# Patient Record
Sex: Female | Born: 1990 | Hispanic: No | Marital: Single | Smoking: Never smoker
Health system: Southern US, Community
[De-identification: ages and names within clinical notes are randomized; demographics above are authoritative.]

## PROBLEM LIST (undated history)

## (undated) ENCOUNTER — Inpatient Hospital Stay (HOSPITAL_COMMUNITY): Payer: Self-pay

## (undated) DIAGNOSIS — O139 Gestational [pregnancy-induced] hypertension without significant proteinuria, unspecified trimester: Secondary | ICD-10-CM

## (undated) DIAGNOSIS — Z789 Other specified health status: Secondary | ICD-10-CM

## (undated) DIAGNOSIS — B999 Unspecified infectious disease: Secondary | ICD-10-CM

## (undated) DIAGNOSIS — D649 Anemia, unspecified: Secondary | ICD-10-CM

## (undated) DIAGNOSIS — N764 Abscess of vulva: Secondary | ICD-10-CM

## (undated) DIAGNOSIS — R51 Headache: Secondary | ICD-10-CM

## (undated) DIAGNOSIS — A630 Anogenital (venereal) warts: Secondary | ICD-10-CM

## (undated) DIAGNOSIS — R519 Headache, unspecified: Secondary | ICD-10-CM

## (undated) HISTORY — DX: Abscess of vulva: N76.4

## (undated) HISTORY — PX: OTHER SURGICAL HISTORY: SHX169

## (undated) HISTORY — DX: Anogenital (venereal) warts: A63.0

## (undated) HISTORY — PX: WISDOM TOOTH EXTRACTION: SHX21

---

## 2017-07-05 ENCOUNTER — Ambulatory Visit: Payer: Self-pay | Admitting: *Deleted

## 2017-07-05 ENCOUNTER — Encounter: Payer: Self-pay | Admitting: *Deleted

## 2017-07-05 DIAGNOSIS — Z3480 Encounter for supervision of other normal pregnancy, unspecified trimester: Secondary | ICD-10-CM

## 2017-07-05 DIAGNOSIS — Z3201 Encounter for pregnancy test, result positive: Secondary | ICD-10-CM

## 2017-07-05 LAB — POCT PREGNANCY, URINE: PREG TEST UR: POSITIVE — AB

## 2017-07-05 NOTE — Progress Notes (Signed)
Here for pregnancy test which was positive. LMP 05/29/17 which makes her [redacted]w[redacted]d with EDd 03/05/18. States not sure where she will go for prenatal care.

## 2017-07-05 NOTE — Progress Notes (Signed)
Chart reviewed for nurse visit. Agree with plan of care.   Marylene Land, CNM 07/05/2017 1:26 PM

## 2017-07-11 ENCOUNTER — Encounter (HOSPITAL_COMMUNITY): Payer: Self-pay | Admitting: *Deleted

## 2017-07-11 ENCOUNTER — Inpatient Hospital Stay (HOSPITAL_COMMUNITY): Payer: Medicaid Other

## 2017-07-11 ENCOUNTER — Inpatient Hospital Stay (HOSPITAL_COMMUNITY)
Admission: AD | Admit: 2017-07-11 | Discharge: 2017-07-11 | Disposition: A | Discharge status: Home / Self Care | Payer: Medicaid Other | Source: Ambulatory Visit | Attending: Obstetrics & Gynecology | Admitting: Obstetrics & Gynecology

## 2017-07-11 DIAGNOSIS — O26891 Other specified pregnancy related conditions, first trimester: Secondary | ICD-10-CM | POA: Diagnosis not present

## 2017-07-11 DIAGNOSIS — R109 Unspecified abdominal pain: Secondary | ICD-10-CM | POA: Insufficient documentation

## 2017-07-11 DIAGNOSIS — O468X1 Other antepartum hemorrhage, first trimester: Secondary | ICD-10-CM

## 2017-07-11 DIAGNOSIS — O208 Other hemorrhage in early pregnancy: Secondary | ICD-10-CM | POA: Diagnosis not present

## 2017-07-11 DIAGNOSIS — O98811 Other maternal infectious and parasitic diseases complicating pregnancy, first trimester: Secondary | ICD-10-CM | POA: Diagnosis not present

## 2017-07-11 DIAGNOSIS — O418X10 Other specified disorders of amniotic fluid and membranes, first trimester, not applicable or unspecified: Secondary | ICD-10-CM | POA: Diagnosis present

## 2017-07-11 DIAGNOSIS — Z3A01 Less than 8 weeks gestation of pregnancy: Secondary | ICD-10-CM | POA: Diagnosis not present

## 2017-07-11 DIAGNOSIS — B373 Candidiasis of vulva and vagina: Secondary | ICD-10-CM | POA: Diagnosis not present

## 2017-07-11 DIAGNOSIS — Z3490 Encounter for supervision of normal pregnancy, unspecified, unspecified trimester: Secondary | ICD-10-CM

## 2017-07-11 DIAGNOSIS — R110 Nausea: Secondary | ICD-10-CM | POA: Insufficient documentation

## 2017-07-11 DIAGNOSIS — Z79899 Other long term (current) drug therapy: Secondary | ICD-10-CM | POA: Diagnosis not present

## 2017-07-11 DIAGNOSIS — B3731 Acute candidiasis of vulva and vagina: Secondary | ICD-10-CM | POA: Diagnosis present

## 2017-07-11 HISTORY — DX: Other specified health status: Z78.9

## 2017-07-11 LAB — WET PREP, GENITAL
CLUE CELLS WET PREP: NONE SEEN
SPERM: NONE SEEN
TRICH WET PREP: NONE SEEN

## 2017-07-11 LAB — HCG, QUANTITATIVE, PREGNANCY: HCG, BETA CHAIN, QUANT, S: 30389 m[IU]/mL — AB (ref <5)

## 2017-07-11 LAB — URINALYSIS, ROUTINE W REFLEX MICROSCOPIC
Bilirubin Urine: NEGATIVE
GLUCOSE, UA: NEGATIVE mg/dL
Hgb urine dipstick: NEGATIVE
KETONES UR: NEGATIVE mg/dL
Nitrite: NEGATIVE
PH: 6 (ref 5.0–8.0)
Protein, ur: NEGATIVE mg/dL
Specific Gravity, Urine: 1.014 (ref 1.005–1.030)

## 2017-07-11 LAB — CBC
HEMATOCRIT: 34.9 % — AB (ref 36.0–46.0)
HEMOGLOBIN: 10.7 g/dL — AB (ref 12.0–15.0)
MCH: 23.2 pg — ABNORMAL LOW (ref 26.0–34.0)
MCHC: 30.7 g/dL (ref 30.0–36.0)
MCV: 75.7 fL — ABNORMAL LOW (ref 78.0–100.0)
Platelets: 380 10*3/uL (ref 150–400)
RBC: 4.61 MIL/uL (ref 3.87–5.11)
RDW: 17.2 % — ABNORMAL HIGH (ref 11.5–15.5)
WBC: 8.8 10*3/uL (ref 4.0–10.5)

## 2017-07-11 LAB — ABO/RH: ABO/RH(D): O POS

## 2017-07-11 MED ORDER — TERCONAZOLE 0.4 % VA CREA: 1.0000 | TOPICAL_CREAM | Freq: Every day | VAGINAL | 0 refills | Status: DC

## 2017-07-11 NOTE — MAU Note (Signed)
Urine sent to lab 

## 2017-07-11 NOTE — MAU Note (Signed)
Pt reports sharp lower abd pain x two weeks, pain is more on right side now. Also reports thick white discharge.

## 2017-07-11 NOTE — MAU Provider Note (Addendum)
Chief Complaint: Abdominal Pain and Vaginal Discharge   First Provider Initiated Contact with Patient 07/11/17 1203     HPI  [name] is a 27 y.o. G1P0 at [redacted]w[redacted]d by LMP who presents to maternity admissions reporting cramping and sharp lower abdominal pain x 2 weeks. The pain started on the left lower side and radiates across to the right but today is more often on the right. It is intermittent and gradually worsening.  It is associated with nausea but no vomiting.  She also reports thick clumping white vaginal discharge x 1 week.  There are no other associated symptoms. She has not tried any treatments. She denies vaginal bleeding, urinary symptoms, h/a, dizziness, or fever/chills.     Past Medical History:  Diagnosis Date  . Medical history non-contributory    Past Surgical History:  Procedure Laterality Date  . WISDOM TOOTH EXTRACTION     Social History   Socioeconomic History  . Marital status: Single    Spouse name: Not on file  . Number of children: Not on file  . Years of education: Not on file  . Highest education level: Not on file  Occupational History  . Not on file  Social Needs  . Financial resource strain: Not on file  . Food insecurity:    Worry: Not on file    Inability: Not on file  . Transportation needs:    Medical: Not on file    Non-medical: Not on file  Tobacco Use  . Smoking status: Never Smoker  . Smokeless tobacco: Never Used  Substance and Sexual Activity  . Alcohol use: Never    Frequency: Never  . Drug use: Never  . Sexual activity: Yes  Lifestyle  . Physical activity:    Days per week: Not on file    Minutes per session: Not on file  . Stress: Not on file  Relationships  . Social connections:    Talks on phone: Not on file    Gets together: Not on file    Attends religious service: Not on file    Active member of club or organization: Not on file    Attends meetings of clubs or organizations: Not on file    Relationship  status: Not on file  . Intimate partner violence:    Fear of current or ex partner: Not on file    Emotionally abused: Not on file    Physically abused: Not on file    Forced sexual activity: Not on file  Other Topics Concern  . Not on file  Social History Narrative  . Not on file   No current facility-administered medications on file prior to encounter.    Current Outpatient Medications on File Prior to Encounter  Medication Sig Dispense Refill  . Prenatal Multivit-Min-Fe-FA (PRENATAL VITAMINS PO) Take 1 tablet by mouth daily.     No Known Allergies  ROS:  Review of Systems  Constitutional: Negative for chills, fatigue and fever.  Respiratory: Negative for shortness of breath.   Cardiovascular: Negative for chest pain.  Gastrointestinal: Positive for abdominal pain and nausea. Negative for vomiting.  Genitourinary: Positive for pelvic pain. Negative for difficulty urinating, dysuria, flank pain, vaginal bleeding, vaginal discharge and vaginal pain.  Musculoskeletal: Negative for back pain.  Neurological: Negative for dizziness and headaches.  Psychiatric/Behavioral: Negative.    I have reviewed patient's Past Medical Hx, Surgical Hx, Family Hx, Social Hx, medications and allergies.   Physical Exam   Patient Vitals for the  past 24 hrs:  BP Temp Temp src Pulse Resp SpO2 Height Weight  07/11/17 1029 132/75 98 F (36.7 C) Oral 70 16 100 % 5' 7.25" (1.708 m) 212 lb (96.2 kg)   Constitutional: Well-developed, well-nourished female in no acute distress.  Cardiovascular: normal rate Respiratory: normal effort GI: Abd soft, non-tender. Pos BS x 4 MS: Extremities nontender, no edema, normal ROM Neurologic: Alert and oriented x 4.  GU: Neg CVAT.   LAB RESULTS Results for orders placed or performed during the hospital encounter of 07/11/17 (from the past 24 hour(s))  Urinalysis, Routine w reflex microscopic     Status: Abnormal   Collection Time: 07/11/17 10:25 AM  Result  Value Ref Range   Color, Urine YELLOW YELLOW   APPearance CLEAR CLEAR   Specific Gravity, Urine 1.014 1.005 - 1.030   pH 6.0 5.0 - 8.0   Glucose, UA NEGATIVE NEGATIVE mg/dL   Hgb urine dipstick NEGATIVE NEGATIVE   Bilirubin Urine NEGATIVE NEGATIVE   Ketones, ur NEGATIVE NEGATIVE mg/dL   Protein, ur NEGATIVE NEGATIVE mg/dL   Nitrite NEGATIVE NEGATIVE   Leukocytes, UA LARGE (A) NEGATIVE   RBC / HPF 0-5 0 - 5 RBC/hpf   WBC, UA 0-5 0 - 5 WBC/hpf   Bacteria, UA RARE (A) NONE SEEN   Squamous Epithelial / LPF 0-5 0 - 5   Mucus PRESENT   CBC     Status: Abnormal   Collection Time: 07/11/17 12:00 PM  Result Value Ref Range   WBC 8.8 4.0 - 10.5 K/uL   RBC 4.61 3.87 - 5.11 MIL/uL   Hemoglobin 10.7 (L) 12.0 - 15.0 g/dL   HCT 16.1 (L) 09.6 - 04.5 %   MCV 75.7 (L) 78.0 - 100.0 fL   MCH 23.2 (L) 26.0 - 34.0 pg   MCHC 30.7 30.0 - 36.0 g/dL   RDW 40.9 (H) 81.1 - 91.4 %   Platelets 380 150 - 400 K/uL  hCG, quantitative, pregnancy     Status: Abnormal   Collection Time: 07/11/17 12:00 PM  Result Value Ref Range   hCG, Beta Chain, Quant, S 30,389 (H) <5 mIU/mL  ABO/Rh     Status: None   Collection Time: 07/11/17 12:00 PM  Result Value Ref Range   ABO/RH(D)      O POS Performed at [facility], [address], [place], [place] [address]   Wet prep, genital     Status: Abnormal   Collection Time: 07/11/17  1:35 PM  Result Value Ref Range   Yeast Wet Prep HPF POC PRESENT (A) NONE SEEN   Trich, Wet Prep NONE SEEN NONE SEEN   Clue Cells Wet Prep HPF POC NONE SEEN NONE SEEN   WBC, Wet Prep HPF POC MANY (A) NONE SEEN   Sperm NONE SEEN    US Ob Comp Less 14 Wks  Result Date: 07/11/2017 CLINICAL DATA:  Pregnant patient with abdominal pain. EXAM: OBSTETRIC <14 WK [name] AND TRANSVAGINAL OB US TECHNIQUE: Both transabdominal and transvaginal ultrasound examinations were performed for complete evaluation of the gestation as well as the maternal uterus, adnexal regions, and pelvic cul-de-sac.  Transvaginal technique was performed to assess early pregnancy. COMPARISON:  None. FINDINGS: Intrauterine gestational sac: Single Yolk sac:  Visualized. Embryo:  Visualized. Cardiac Activity: Visualized. Heart Rate: 115 bpm CRL:  4.8 mm   6 w   1 d                  [name] EDC: 03/24/2018 Subchorionic  hemorrhage:  Small Maternal uterus/adnexae: Normal right and left ovaries. Corpus luteum left ovary. No free fluid in the pelvis. IMPRESSION: Single live intrauterine gestation.  Small subchorionic hemorrhage. Electronically Signed   By: Annia Belt M.D.   On: 07/11/2017 13:23   US Ob Transvaginal  Result Date: 07/11/2017 CLINICAL DATA:  Pregnant patient with abdominal pain. EXAM: OBSTETRIC <14 WK [name] AND TRANSVAGINAL OB US TECHNIQUE: Both transabdominal and transvaginal ultrasound examinations were performed for complete evaluation of the gestation as well as the maternal uterus, adnexal regions, and pelvic cul-de-sac. Transvaginal technique was performed to assess early pregnancy. COMPARISON:  None. FINDINGS: Intrauterine gestational sac: Single Yolk sac:  Visualized. Embryo:  Visualized. Cardiac Activity: Visualized. Heart Rate: 115 bpm CRL:  4.8 mm   6 w   1 d                  [name] EDC: 03/24/2018 Subchorionic hemorrhage:  Small Maternal uterus/adnexae: Normal right and left ovaries. Corpus luteum left ovary. No free fluid in the pelvis. IMPRESSION: Single live intrauterine gestation.  Small subchorionic hemorrhage. Electronically Signed   By: Annia Belt M.D.   On: 07/11/2017 13:23    Report to Raelyn Mora, CNM  Sharen Counter Certified Nurse-Midwife 07/11/2017  2:10 PM  Assessment/Plan  Candida vaginitis - Plan: Discharge patient - Rx Terazol 0.4% vaginal cream at bedtime x 5 days - Information provided on vaginal yeast infection   Abdominal pain during pregnancy in first trimester - Discussed pain could possibly be from [facility] irritation of uterine lining - Discussed higher risk for miscarriage -  Information provided on threatened miscarriage   Intrauterine pregnancy - Establish prenatal care soon - [place] [facility] provider list given - Safe meds in preg list given  Subchorionic hematoma in first trimester, single or unspecified fetus - Discussed that [facility] could cause bleeding - Bleeding precautions reviewed - Information provided on [facility]  - Discharge patient - Patient verbalized an understanding of the plan of care and agrees.     Raelyn Mora, CNM  07/11/2017 1:40 PM

## 2017-07-11 NOTE — Discharge Instructions (Signed)
CHOOSE AN OB/GYN PROVIDER AND START PRENATAL CARE Samantha Alexander Area Ob/Gyn Providers    Center for [facility] at [facility]       Phone: [phone]  Center for [facility] [place] Phone: [phone]  [facility] [place]  Phone: [phone]  [facility] at [facility]  Phone: [phone]  Center for [facility] [place]  Phone: [phone]  [facility]       Phone: [phone]  [facility]    Phone: [phone]   [facility]    Phone: [phone]  [facility]    Phone: [phone]  [facility]    Phone: [phone]  [facility]    Phone: [phone]  [facility]       Phone: [phone]   [facility]  Phone: [phone]  [facility]    Phone: [phone]  [facility]   Phone: [phone]  [facility]      Phone: [phone]  [facility]    Phone: [phone]  Safe Medications in Pregnancy   Acne: Benzoyl Peroxide Salicylic Acid  Backache/Headache: Tylenol: 2 regular strength every 4 hours OR              2 Extra strength every 6 hours  Colds/Coughs/Allergies: Benadryl (alcohol free) 25 mg every 6 hours as needed Breath right strips Claritin Cepacol throat lozenges Chloraseptic throat spray Cold-Eeze- up to three times per day Cough drops, alcohol free Flonase (by prescription only) Guaifenesin Mucinex Robitussin DM (plain only, alcohol free) Saline nasal spray/drops Sudafed (pseudoephedrine) & Actifed ** use only after [redacted] weeks gestation and if you do not have high blood pressure Tylenol Vicks Vaporub Zinc lozenges Zyrtec   Constipation: Colace Ducolax suppositories Fleet  enema Glycerin suppositories Metamucil Milk of magnesia Miralax Senokot Smooth move tea  Diarrhea: Kaopectate Imodium A-D  *NO pepto Bismol  Hemorrhoids: Anusol Anusol HC Preparation H Tucks  Indigestion: Tums Maalox Mylanta Zantac  Pepcid  Insomnia: Benadryl (alcohol free)  every 6 hours as needed Tylenol PM Unisom, no Gelcaps  Leg Cramps: Tums MagGel  Nausea/Vomiting:  Bonine Dramamine Emetrol Ginger extract Sea bands Meclizine  Nausea medication to take during pregnancy:  Unisom (doxylamine succinate 25 mg tablets) Take one tablet daily at bedtime. If symptoms are not adequately controlled, the dose can be increased to a maximum recommended dose of two tablets daily (1/2 tablet in the morning, 1/2 tablet mid-afternoon and one at bedtime). Vitamin B6  tablets. Take one tablet twice a day (up to 200 mg per day).  Skin Rashes: Aveeno products Benadryl cream or  every 6 hours as needed Calamine Lotion 1% cortisone cream  Yeast infection: Gyne-lotrimin 7 Monistat 7   **If taking multiple medications, please check labels to avoid duplicating the same active ingredients **take medication as directed on the label ** Do not exceed 4000 mg of tylenol in 24 hours **Do not take medications that contain aspirin or ibuprofen

## 2017-07-12 LAB — HIV ANTIBODY (ROUTINE TESTING W REFLEX): HIV Screen 4th Generation wRfx: NONREACTIVE

## 2017-07-12 LAB — GC/CHLAMYDIA PROBE AMP (~~LOC~~) NOT AT ARMC
Chlamydia: NEGATIVE
Neisseria Gonorrhea: NEGATIVE

## 2017-07-14 ENCOUNTER — Other Ambulatory Visit: Payer: Self-pay

## 2017-07-14 ENCOUNTER — Encounter (HOSPITAL_COMMUNITY): Payer: Self-pay | Admitting: Emergency Medicine

## 2017-07-14 ENCOUNTER — Emergency Department (HOSPITAL_COMMUNITY)
Admission: EM | Admit: 2017-07-14 | Discharge: 2017-07-14 | Disposition: A | Discharge status: Home / Self Care | Payer: Medicaid Other | Attending: Emergency Medicine | Admitting: Emergency Medicine

## 2017-07-14 DIAGNOSIS — R110 Nausea: Secondary | ICD-10-CM

## 2017-07-14 DIAGNOSIS — R8271 Bacteriuria: Secondary | ICD-10-CM | POA: Diagnosis not present

## 2017-07-14 DIAGNOSIS — R1110 Vomiting, unspecified: Secondary | ICD-10-CM | POA: Diagnosis present

## 2017-07-14 LAB — COMPREHENSIVE METABOLIC PANEL
ALBUMIN: 3.9 g/dL (ref 3.5–5.0)
ALT: 18 U/L (ref 14–54)
ANION GAP: 11 (ref 5–15)
AST: 19 U/L (ref 15–41)
Alkaline Phosphatase: 72 U/L (ref 38–126)
BILIRUBIN TOTAL: 0.4 mg/dL (ref 0.3–1.2)
BUN: 9 mg/dL (ref 6–20)
CHLORIDE: 104 mmol/L (ref 101–111)
CO2: 22 mmol/L (ref 22–32)
Calcium: 9.5 mg/dL (ref 8.9–10.3)
Creatinine, Ser: 0.91 mg/dL (ref 0.44–1.00)
GFR calc Af Amer: >60 mL/min (ref >60)
GFR calc non Af Amer: >60 mL/min (ref >60)
GLUCOSE: 102 mg/dL — AB (ref 65–99)
POTASSIUM: 3.9 mmol/L (ref 3.5–5.1)
SODIUM: 137 mmol/L (ref 135–145)
Total Protein: 7.2 g/dL (ref 6.5–8.1)

## 2017-07-14 LAB — CBC
HCT: 33.8 % — ABNORMAL LOW (ref 36.0–46.0)
HEMOGLOBIN: 10.3 g/dL — AB (ref 12.0–15.0)
MCH: 22.3 pg — AB (ref 26.0–34.0)
MCHC: 30.5 g/dL (ref 30.0–36.0)
MCV: 73.3 fL — AB (ref 78.0–100.0)
Platelets: 422 10*3/uL — ABNORMAL HIGH (ref 150–400)
RBC: 4.61 MIL/uL (ref 3.87–5.11)
RDW: 17.1 % — AB (ref 11.5–15.5)
WBC: 10.8 10*3/uL — ABNORMAL HIGH (ref 4.0–10.5)

## 2017-07-14 LAB — URINALYSIS, ROUTINE W REFLEX MICROSCOPIC
Bilirubin Urine: NEGATIVE
Glucose, UA: NEGATIVE mg/dL
Hgb urine dipstick: NEGATIVE
Ketones, ur: 5 mg/dL — AB
Leukocytes, UA: NEGATIVE
Nitrite: NEGATIVE
PROTEIN: 30 mg/dL — AB
SPECIFIC GRAVITY, URINE: 1.026 (ref 1.005–1.030)
pH: 5 (ref 5.0–8.0)

## 2017-07-14 LAB — LIPASE, BLOOD: LIPASE: 23 U/L (ref 11–51)

## 2017-07-14 LAB — I-STAT BETA HCG BLOOD, ED (MC, WL, AP ONLY)

## 2017-07-14 MED ORDER — CEPHALEXIN 250 MG PO CAPS
500.0000 mg | ORAL_CAPSULE | Freq: Once | ORAL | Status: AC
  Administered 2017-07-14: 500 mg via ORAL
  Filled 2017-07-14: qty 2

## 2017-07-14 MED ORDER — CEPHALEXIN 500 MG PO CAPS: 500.0000 mg | ORAL_CAPSULE | Freq: Four times a day (QID) | ORAL | 0 refills | Status: DC

## 2017-07-14 MED ORDER — ONDANSETRON HCL 4 MG PO TABS: 4.0000 mg | ORAL_TABLET | Freq: Three times a day (TID) | ORAL | 0 refills | Status: DC | PRN

## 2017-07-14 MED ORDER — ONDANSETRON 4 MG PO TBDP
4.0000 mg | ORAL_TABLET | Freq: Once | ORAL | Status: AC
Start: 2017-07-14 — End: 2017-07-14
  Administered 2017-07-14: 4 mg via ORAL
  Filled 2017-07-14: qty 1

## 2017-07-14 NOTE — ED Triage Notes (Addendum)
Reports waking up around 0400 with nausea.  By 0500 had n/v/d.  Reports vomiting about 5 times.  Patient reported later in triage that she is [redacted] weeks pregnant.

## 2017-07-14 NOTE — ED Notes (Signed)
Pt kept liquids down and ambulated to restroom.

## 2017-07-14 NOTE — ED Provider Notes (Signed)
[facility] EMERGENCY DEPARTMENT Provider Note   CSN: [id number] Arrival date & time: 07/14/17  0601     History   Chief Complaint Chief Complaint  Patient presents with  . Nausea  . Emesis    HPI Samantha Alexander is a 27 y.o. female.   Emesis   This is a new problem. The current episode started 3 to 5 hours ago. The problem occurs 2 to 4 times per day. The problem has been gradually improving. The emesis has an appearance of stomach contents. There has been no fever.    Past Medical History:  Diagnosis Date  . Medical history non-contributory     Patient Active Problem List   Diagnosis Date Noted  . Candida vaginitis 07/11/2017  . Subchorionic hematoma in first trimester 07/11/2017  . Intrauterine pregnancy 07/11/2017    Past Surgical History:  Procedure Laterality Date  . WISDOM TOOTH EXTRACTION       OB History    Gravida  1   Para      Term      Preterm      AB      Living        SAB      TAB      Ectopic      Multiple      Live Births               Home Medications    Prior to Admission medications   Medication Sig Start Date End Date Taking? Authorizing Provider  Prenatal Multivit-Min-Fe-FA (PRENATAL VITAMINS PO) Take 1 tablet by mouth daily.   Yes [provider]  cephALEXin (KEFLEX) 500 MG capsule Take 1 capsule (500 mg total) by mouth 4 (four) times daily. 07/14/17   Elynor Kallenberger, Barbara Cower, MD  ondansetron (ZOFRAN) 4 MG tablet Take 1 tablet (4 mg total) by mouth every 8 (eight) hours as needed for nausea or vomiting. 07/14/17   Arlen Legendre, Barbara Cower, MD    Family History No family history on file.  Social History Social History   Tobacco Use  . Smoking status: Never Smoker  . Smokeless tobacco: Never Used  Substance Use Topics  . Alcohol use: Never    Frequency: Never  . Drug use: Never     Allergies   Patient has no known allergies.   Review of Systems Review of Systems  Gastrointestinal: Positive  for vomiting.  Genitourinary: Positive for dysuria and frequency.  All other systems reviewed and are negative.    Physical Exam Updated Vital Signs BP 130/60   Pulse 69   Temp 98.4 F (36.9 C) (Oral)   Resp 16   Ht  (1.727 m)   Wt 96.2 kg (212 lb)   LMP 05/29/2017   SpO2 100%   BMI 32.23 kg/m   Physical Exam  Constitutional: She is oriented to person, place, and time. She appears well-developed and well-nourished.  HENT:  Head: Normocephalic and atraumatic.  Eyes: Conjunctivae and EOM are normal.  Neck: Normal range of motion.  Cardiovascular: Normal rate and regular rhythm.  Pulmonary/Chest: Effort normal and breath sounds normal. No stridor. No respiratory distress.  Abdominal: Soft. She exhibits no distension. There is no tenderness. There is no guarding.  Musculoskeletal: Normal range of motion. She exhibits no edema or deformity.  Neurological: She is alert and oriented to person, place, and time. No cranial nerve deficit. Coordination normal.  Skin: Skin is warm and dry.  Nursing note and vitals reviewed.  ED Treatments / Results  Labs (all labs ordered are listed, but only abnormal results are displayed) Labs Reviewed  COMPREHENSIVE METABOLIC PANEL - Abnormal; Notable for the following components:      Result Value   Glucose, Bld 102 (*)    All other components within normal limits  CBC - Abnormal; Notable for the following components:   WBC 10.8 (*)    Hemoglobin 10.3 (*)    HCT 33.8 (*)    MCV 73.3 (*)    MCH 22.3 (*)    RDW 17.1 (*)    Platelets 422 (*)    All other components within normal limits  URINALYSIS, ROUTINE W REFLEX MICROSCOPIC - Abnormal; Notable for the following components:   APPearance HAZY (*)    Ketones, ur 5 (*)    Protein, ur 30 (*)    Bacteria, UA RARE (*)    All other components within normal limits  I-STAT BETA HCG BLOOD, ED ([facility], [facility], [facility] ONLY) - Abnormal; Notable for the following components:   I-stat hCG, quantitative  >2,000.0 (*)    All other components within normal limits  URINE CULTURE  LIPASE, BLOOD    EKG None  Radiology No results found.  Procedures Procedures (including critical care time)  Medications Ordered in ED Medications  cephALEXin (KEFLEX) capsule 500 mg (500 mg Oral Given 07/14/17 1121)  ondansetron (ZOFRAN-ODT) disintegrating tablet 4 mg (4 mg Oral Given 07/14/17 1121)     Initial Impression / Assessment and Plan / ED Course  I have reviewed the triage vital signs and the nursing notes.  Pertinent labs & imaging results that were available during my care of the patient were reviewed by me and considered in my medical decision making (see chart for details).     Morning sickness vs gastroenteritis. Stated it might have some pink tinge to it but her PNV is pink/red as well so unsure if blood as she did not have it after the first time. Abdomen benign, doubt GB, appendix, colon or intestinal issues. With dysuria/frequency/bacteriuria in pregnant woman will treat abx.   Final Clinical Impressions(s) / ED Diagnoses   Final diagnoses:  Nausea  Bacteriuria    ED Discharge Orders        Ordered    ondansetron (ZOFRAN) 4 MG tablet  Every 8 hours PRN     07/14/17 1405    cephALEXin (KEFLEX) 500 MG capsule  4 times daily     07/14/17 1405       Jaeceon Michelin, Barbara Cower, MD 07/14/17 1604

## 2017-07-15 LAB — URINE CULTURE: Culture: <10000 — AB

## 2017-07-25 ENCOUNTER — Telehealth: Payer: Self-pay | Admitting: *Deleted

## 2017-07-25 NOTE — Telephone Encounter (Signed)
Samantha Alexander left a message today that she is calling re: wanting to get a copy of pregnancy confirmation and her test results. I called Samantha Alexander and was unable to leave a message- heard phone ring but no answer and no voicemail.   Will send MyChart message.

## 2017-07-31 ENCOUNTER — Encounter: Payer: Self-pay | Admitting: Advanced Practice Midwife

## 2017-07-31 ENCOUNTER — Other Ambulatory Visit: Payer: Self-pay

## 2017-07-31 ENCOUNTER — Other Ambulatory Visit: Payer: Self-pay | Admitting: Advanced Practice Midwife

## 2017-07-31 ENCOUNTER — Inpatient Hospital Stay (HOSPITAL_COMMUNITY): Payer: Medicaid Other

## 2017-07-31 ENCOUNTER — Inpatient Hospital Stay (HOSPITAL_COMMUNITY)
Admission: AD | Admit: 2017-07-31 | Discharge: 2017-07-31 | Disposition: A | Discharge status: Home / Self Care | Payer: Medicaid Other | Source: Ambulatory Visit | Attending: Obstetrics and Gynecology | Admitting: Obstetrics and Gynecology

## 2017-07-31 ENCOUNTER — Encounter (HOSPITAL_COMMUNITY): Payer: Self-pay

## 2017-07-31 DIAGNOSIS — O418X10 Other specified disorders of amniotic fluid and membranes, first trimester, not applicable or unspecified: Secondary | ICD-10-CM

## 2017-07-31 DIAGNOSIS — O4691 Antepartum hemorrhage, unspecified, first trimester: Secondary | ICD-10-CM | POA: Insufficient documentation

## 2017-07-31 DIAGNOSIS — B373 Candidiasis of vulva and vagina: Secondary | ICD-10-CM

## 2017-07-31 DIAGNOSIS — O468X1 Other antepartum hemorrhage, first trimester: Secondary | ICD-10-CM

## 2017-07-31 DIAGNOSIS — O209 Hemorrhage in early pregnancy, unspecified: Secondary | ICD-10-CM

## 2017-07-31 DIAGNOSIS — B3731 Acute candidiasis of vulva and vagina: Secondary | ICD-10-CM

## 2017-07-31 DIAGNOSIS — O26891 Other specified pregnancy related conditions, first trimester: Secondary | ICD-10-CM | POA: Insufficient documentation

## 2017-07-31 DIAGNOSIS — Z3A09 9 weeks gestation of pregnancy: Secondary | ICD-10-CM | POA: Insufficient documentation

## 2017-07-31 DIAGNOSIS — Z79899 Other long term (current) drug therapy: Secondary | ICD-10-CM | POA: Diagnosis not present

## 2017-07-31 DIAGNOSIS — Z3490 Encounter for supervision of normal pregnancy, unspecified, unspecified trimester: Secondary | ICD-10-CM

## 2017-07-31 DIAGNOSIS — O99019 Anemia complicating pregnancy, unspecified trimester: Secondary | ICD-10-CM | POA: Insufficient documentation

## 2017-07-31 LAB — URINALYSIS, ROUTINE W REFLEX MICROSCOPIC
Bilirubin Urine: NEGATIVE
GLUCOSE, UA: NEGATIVE mg/dL
Hgb urine dipstick: NEGATIVE
Ketones, ur: NEGATIVE mg/dL
LEUKOCYTES UA: NEGATIVE
Nitrite: NEGATIVE
PH: 7 (ref 5.0–8.0)
Protein, ur: NEGATIVE mg/dL
SPECIFIC GRAVITY, URINE: 1.014 (ref 1.005–1.030)

## 2017-07-31 MED ORDER — FERROUS SULFATE 325 (65 FE) MG PO TABS: 325.0000 mg | ORAL_TABLET | Freq: Two times a day (BID) | ORAL | 1 refills | Status: DC

## 2017-07-31 NOTE — MAU Note (Signed)
Pt experienced pink spotting last night. Pt states mucous discharge.  Cramping on and off for the past 2 days.

## 2017-07-31 NOTE — MAU Note (Signed)
Last night around 1130, noted some spotting and has had a mucous d/c for the past few days.  Hx of subchorionic hematoma, wanting to make sure everything is ok.

## 2017-07-31 NOTE — MAU Provider Note (Signed)
Chief Complaint: Vaginal Bleeding and Vaginal Discharge   First Provider Initiated Contact with Patient 07/31/17 1013     SUBJECTIVE HPI: Samantha Alexander is a 27 y.o. G1P0 at [redacted]w[redacted]d by LMP who presents to maternity admissions reporting spotting one time last night, pink mixed with mucous.  Had some sharp pain yesterday but none since.  Denies yeast infection to me.  Has a known Subchorionic Hemorrhage. She denies vaginal itching/burning, urinary symptoms, h/a, dizziness, n/v, or fever/chills.    Vaginal Bleeding  The patient's primary symptoms include vaginal bleeding and vaginal discharge. The patient's pertinent negatives include no genital itching, genital lesions, genital odor or pelvic pain. This is a new problem. The current episode started yesterday. The problem occurs rarely. The problem has been resolved. The patient is experiencing no pain. She is pregnant. Pertinent negatives include no abdominal pain, back pain, chills, constipation, diarrhea or fever. The vaginal discharge was bloody. The vaginal bleeding is spotting. Nothing aggravates the symptoms. She has tried nothing for the symptoms.  Vaginal Discharge  The patient's primary symptoms include vaginal bleeding and vaginal discharge. The patient's pertinent negatives include no genital itching, genital lesions, genital odor or pelvic pain. The current episode started yesterday. The problem occurs rarely. The problem has been resolved. Pertinent negatives include no abdominal pain, back pain, chills, constipation, diarrhea or fever. The vaginal discharge was mucoid. The vaginal bleeding is spotting.   RN note: Last night around 1130, noted some spotting and has had a mucous d/c for the past few days.  Hx of subchorionic hematoma, wanting to make sure everything is ok.    Past Medical History:  Diagnosis Date  . Medical history non-contributory    Past Surgical History:  Procedure Laterality Date  . WISDOM TOOTH EXTRACTION      Social History   Socioeconomic History  . Marital status: Single    Spouse name: Not on file  . Number of children: Not on file  . Years of education: Not on file  . Highest education level: Not on file  Occupational History  . Not on file  Social Needs  . Financial resource strain: Not on file  . Food insecurity:    Worry: Not on file    Inability: Not on file  . Transportation needs:    Medical: Not on file    Non-medical: Not on file  Tobacco Use  . Smoking status: Never Smoker  . Smokeless tobacco: Never Used  Substance and Sexual Activity  . Alcohol use: Never    Frequency: Never  . Drug use: Never  . Sexual activity: Yes  Lifestyle  . Physical activity:    Days per week: Not on file    Minutes per session: Not on file  . Stress: Not on file  Relationships  . Social connections:    Talks on phone: Not on file    Gets together: Not on file    Attends religious service: Not on file    Active member of club or organization: Not on file    Attends meetings of clubs or organizations: Not on file    Relationship status: Not on file  . Intimate partner violence:    Fear of current or ex partner: Not on file    Emotionally abused: Not on file    Physically abused: Not on file    Forced sexual activity: Not on file  Other Topics Concern  . Not on file  Social History Narrative  . Not on file  No current facility-administered medications on file prior to encounter.    Current Outpatient Medications on File Prior to Encounter  Medication Sig Dispense Refill  . cephALEXin (KEFLEX) 500 MG capsule Take 1 capsule (500 mg total) by mouth 4 (four) times daily. 28 capsule 0  . ondansetron (ZOFRAN) 4 MG tablet Take 1 tablet (4 mg total) by mouth every 8 (eight) hours as needed for nausea or vomiting. 12 tablet 0  . Prenatal Multivit-Min-Fe-FA (PRENATAL VITAMINS PO) Take 1 tablet by mouth daily.     No Known Allergies  I have reviewed patient's Past Medical Hx,  Surgical Hx, Family Hx, Social Hx, medications and allergies.   ROS:  Review of Systems  Constitutional: Negative for chills and fever.  Gastrointestinal: Negative for abdominal pain, constipation and diarrhea.  Genitourinary: Positive for vaginal bleeding and vaginal discharge. Negative for pelvic pain.  Musculoskeletal: Negative for back pain.   Review of Systems  Other systems negative   Physical Exam  Physical Exam Patient Vitals for the past 24 hrs:  BP Temp Temp src Pulse Resp SpO2 Weight  07/31/17 1006 126/73 98.3 F (36.8 C) Oral 85 17 100 % 211 lb 12 oz (96 kg)   Constitutional: Well-developed, well-nourished female in no acute distress.  Cardiovascular: normal rate Respiratory: normal effort GI: Abd soft, non-tender. Pos BS x 4 MS: Extremities nontender, no edema, normal ROM Neurologic: Alert and oriented x 4.  GU: Neg CVAT.  PELVIC EXAM: speculum exam deferred.     LAB RESULTS Results for orders placed or performed during the hospital encounter of 07/31/17 (from the past 24 hour(s))  Urinalysis, Routine w reflex microscopic     Status: None   Collection Time: 07/31/17 10:07 AM  Result Value Ref Range   Color, Urine YELLOW YELLOW   APPearance CLEAR CLEAR   Specific Gravity, Urine 1.014 1.005 - 1.030   pH 7.0 5.0 - 8.0   Glucose, UA NEGATIVE NEGATIVE mg/dL   Hgb urine dipstick NEGATIVE NEGATIVE   Bilirubin Urine NEGATIVE NEGATIVE   Ketones, ur NEGATIVE NEGATIVE mg/dL   Protein, ur NEGATIVE NEGATIVE mg/dL   Nitrite NEGATIVE NEGATIVE   Leukocytes, UA NEGATIVE NEGATIVE    --/--/O POS Performed at [facility], [address], [place], [place] [address]  (05/09 1200)  IMAGING Limited US :   Fetal heart rate 166 by [name] (I was unable to see fetus clearly on bedside US)    MAU Management/MDM: Ordered US for FHR confirmation Discussed occasional spotting is not uncommon with known subchorionic hemorrhage Has not gotten prenatal care yet, has  list   ASSESSMENT 1. Candida vaginitis   2. Subchorionic hematoma in first trimester, single or unspecified fetus   3. Intrauterine pregnancy     PLAN Discharge home Encouraged to seek prenatal care soon Pt stable at time of discharge. Encouraged to return here or to other Urgent Care/ED if she develops worsening of symptoms, increase in pain, fever, or other concerning symptoms.    Wynelle Bourgeois CNM, MSN Certified Nurse-Midwife 07/31/2017  10:13 AM

## 2017-07-31 NOTE — Discharge Instructions (Signed)
Subchorionic Hematoma A subchorionic hematoma is a gathering of blood between the outer wall of the placenta and the inner wall of the womb (uterus). The placenta is the organ that connects the fetus to the wall of the uterus. The placenta performs the feeding, breathing (oxygen to the fetus), and waste removal (excretory work) of the fetus. Subchorionic hematoma is the most common abnormality found on a result from ultrasonography done during the first trimester or early second trimester of pregnancy. If there has been little or no vaginal bleeding, early small hematomas usually shrink on their own and do not affect your baby or pregnancy. The blood is gradually absorbed over 1-2 weeks. When bleeding starts later in pregnancy or the hematoma is larger or occurs in an older pregnant woman, the outcome may not be as good. Larger hematomas may get bigger, which increases the chances for miscarriage. Subchorionic hematoma also increases the risk of premature detachment of the placenta from the uterus, preterm (premature) labor, and stillbirth. Follow these instructions at home:  Stay on bed rest if your health care provider recommends this. Although bed rest will not prevent more bleeding or prevent a miscarriage, your health care provider may recommend bed rest until you are advised otherwise.  Avoid heavy lifting (more than 10 lb [4.5 kg]), exercise, sexual intercourse, or douching as directed by your health care provider.  Keep track of the number of pads you use each day and how soaked (saturated) they are. Write down this information.  Do not use tampons.  Keep all follow-up appointments as directed by your health care provider. Your health care provider may ask you to have follow-up blood tests or ultrasound tests or both. Get help right away if:  You have severe cramps in your stomach, back, abdomen, or pelvis.  You have a fever.  You pass large clots or tissue. Save any tissue for your  health care provider to look at.  Your bleeding increases or you become lightheaded, feel weak, or have fainting episodes. This information is not intended to replace advice given to you by your health care provider. Make sure you discuss any questions you have with your health care provider. Document Released: 06/06/2006 Document Revised: 07/28/2015 Document Reviewed: 09/18/2012 Elsevier Interactive Patient Education  2017 [employer]. First Trimester of Pregnancy The first trimester of pregnancy is from week 1 until the end of week 13 (months 1 through 3). During this time, your baby will begin to develop inside you. At 6-8 weeks, the eyes and face are formed, and the heartbeat can be seen on ultrasound. At the end of 12 weeks, all the baby's organs are formed. Prenatal care is all the medical care you receive before the birth of your baby. Make sure you get good prenatal care and follow all of your doctor's instructions. Follow these instructions at home: Medicines  Take over-the-counter and prescription medicines only as told by your doctor. Some medicines are safe and some medicines are not safe during pregnancy.  Take a prenatal vitamin that contains at least 600 micrograms (mcg) of folic acid.  If you have trouble pooping (constipation), take medicine that will make your stool soft (stool softener) if your doctor approves. Eating and drinking  Eat regular, healthy meals.  Your doctor will tell you the amount of weight gain that is right for you.  Avoid raw meat and uncooked cheese.  If you feel sick to your stomach (nauseous) or throw up (vomit): ? Eat 4 or 5 small  meals a day instead of 3 large meals. ? Try eating a few soda crackers. ? Drink liquids between meals instead of during meals.  To prevent constipation: ? Eat foods that are high in fiber, like fresh fruits and vegetables, whole grains, and beans. ? Drink enough fluids to keep your pee (urine) clear or pale  yellow. Activity  Exercise only as told by your doctor. Stop exercising if you have cramps or pain in your lower belly (abdomen) or low back.  Do not exercise if it is too hot, too humid, or if you are in a place of great height (high altitude).  Try to avoid standing for long periods of time. Move your legs often if you must stand in one place for a long time.  Avoid heavy lifting.  Wear low-heeled shoes. Sit and stand up straight.  You can have sex unless your doctor tells you not to. Relieving pain and discomfort  Wear a good support bra if your breasts are sore.  Take warm water baths (sitz baths) to soothe pain or discomfort caused by hemorrhoids. Use hemorrhoid cream if your doctor says it is okay.  Rest with your legs raised if you have leg cramps or low back pain.  If you have puffy, bulging veins (varicose veins) in your legs: ? Wear support hose or compression stockings as told by your doctor. ? Raise (elevate) your feet for 15 minutes, 3-4 times a day. ? Limit salt in your food. Prenatal care  Schedule your prenatal visits by the twelfth week of pregnancy.  Write down your questions. Take them to your prenatal visits.  Keep all your prenatal visits as told by your doctor. This is important. Safety  Wear your seat belt at all times when driving.  Make a list of emergency phone numbers. The list should include numbers for family, friends, the hospital, and police and fire departments. General instructions  Ask your doctor for a referral to a local prenatal class. Begin classes no later than at the start of month 6 of your pregnancy.  Ask for help if you need counseling or if you need help with nutrition. Your doctor can give you advice or tell you where to go for help.  Do not use hot tubs, steam rooms, or saunas.  Do not douche or use tampons or scented sanitary pads.  Do not cross your legs for long periods of time.  Avoid all herbs and alcohol. Avoid  drugs that are not approved by your doctor.  Do not use any tobacco products, including cigarettes, chewing tobacco, and electronic cigarettes. If you need help quitting, ask your doctor. You may get counseling or other support to help you quit.  Avoid cat litter boxes and soil used by cats. These carry germs that can cause birth defects in the baby and can cause a loss of your baby (miscarriage) or stillbirth.  Visit your dentist. At home, brush your teeth with a soft toothbrush. Be gentle when you floss. Contact a doctor if:  You are dizzy.  You have mild cramps or pressure in your lower belly.  You have a nagging pain in your belly area.  You continue to feel sick to your stomach, you throw up, or you have watery poop (diarrhea).  You have a bad smelling fluid coming from your vagina.  You have pain when you pee (urinate).  You have increased puffiness (swelling) in your face, hands, legs, or ankles. Get help right away if:  You  have a fever.  You are leaking fluid from your vagina.  You have spotting or bleeding from your vagina.  You have very bad belly cramping or pain.  You gain or lose weight rapidly.  You throw up blood. It may look like coffee grounds.  You are around people who have [place] measles, fifth disease, or chickenpox.  You have a very bad headache.  You have shortness of breath.  You have any kind of trauma, such as from a fall or a car accident. Summary  The first trimester of pregnancy is from week 1 until the end of week 13 (months 1 through 3).  To take care of yourself and your unborn baby, you will need to eat healthy meals, take medicines only if your doctor tells you to do so, and do activities that are safe for you and your baby.  Keep all follow-up visits as told by your doctor. This is important as your doctor will have to ensure that your baby is healthy and growing well. This information is not intended to replace advice given to you  by your health care provider. Make sure you discuss any questions you have with your health care provider. Document Released: 08/08/2007 Document Revised: 02/28/2016 Document Reviewed: 02/28/2016 Elsevier Interactive Patient Education  2017 [employer].

## 2017-07-31 NOTE — Progress Notes (Signed)
Seen in MAU  Anemia noted Rx sent for FeSO4

## 2017-08-15 LAB — OB RESULTS CONSOLE ABO/RH: RH Type: POSITIVE

## 2017-08-15 LAB — OB RESULTS CONSOLE HIV ANTIBODY (ROUTINE TESTING): HIV: NONREACTIVE

## 2017-08-15 LAB — OB RESULTS CONSOLE GC/CHLAMYDIA
Chlamydia: NEGATIVE
GC PROBE AMP, GENITAL: NEGATIVE

## 2017-08-15 LAB — OB RESULTS CONSOLE ANTIBODY SCREEN: Antibody Screen: NEGATIVE

## 2017-08-15 LAB — OB RESULTS CONSOLE RPR: RPR: NONREACTIVE

## 2017-08-15 LAB — OB RESULTS CONSOLE RUBELLA ANTIBODY, IGM: Rubella: IMMUNE

## 2017-08-15 LAB — OB RESULTS CONSOLE HEPATITIS B SURFACE ANTIGEN: Hepatitis B Surface Ag: NEGATIVE

## 2018-01-23 ENCOUNTER — Encounter (HOSPITAL_COMMUNITY): Payer: Self-pay | Admitting: *Deleted

## 2018-01-23 ENCOUNTER — Inpatient Hospital Stay (HOSPITAL_COMMUNITY)
Admission: AD | Admit: 2018-01-23 | Discharge: 2018-01-23 | Disposition: A | Discharge status: Home / Self Care | Payer: Medicaid Other | Source: Ambulatory Visit | Attending: Obstetrics and Gynecology | Admitting: Obstetrics and Gynecology

## 2018-01-23 ENCOUNTER — Other Ambulatory Visit: Payer: Self-pay

## 2018-01-23 DIAGNOSIS — O418X10 Other specified disorders of amniotic fluid and membranes, first trimester, not applicable or unspecified: Secondary | ICD-10-CM

## 2018-01-23 DIAGNOSIS — O99013 Anemia complicating pregnancy, third trimester: Secondary | ICD-10-CM

## 2018-01-23 DIAGNOSIS — B373 Candidiasis of vulva and vagina: Secondary | ICD-10-CM | POA: Diagnosis not present

## 2018-01-23 DIAGNOSIS — Z3490 Encounter for supervision of normal pregnancy, unspecified, unspecified trimester: Secondary | ICD-10-CM

## 2018-01-23 DIAGNOSIS — O26893 Other specified pregnancy related conditions, third trimester: Secondary | ICD-10-CM | POA: Diagnosis not present

## 2018-01-23 DIAGNOSIS — O23593 Infection of other part of genital tract in pregnancy, third trimester: Secondary | ICD-10-CM | POA: Insufficient documentation

## 2018-01-23 DIAGNOSIS — O468X1 Other antepartum hemorrhage, first trimester: Secondary | ICD-10-CM

## 2018-01-23 DIAGNOSIS — B3731 Acute candidiasis of vulva and vagina: Secondary | ICD-10-CM

## 2018-01-23 DIAGNOSIS — Z3A34 34 weeks gestation of pregnancy: Secondary | ICD-10-CM | POA: Insufficient documentation

## 2018-01-23 DIAGNOSIS — Z3689 Encounter for other specified antenatal screening: Secondary | ICD-10-CM

## 2018-01-23 LAB — URINALYSIS, ROUTINE W REFLEX MICROSCOPIC
Bilirubin Urine: NEGATIVE
GLUCOSE, UA: NEGATIVE mg/dL
Hgb urine dipstick: NEGATIVE
KETONES UR: NEGATIVE mg/dL
Nitrite: NEGATIVE
PROTEIN: NEGATIVE mg/dL
Specific Gravity, Urine: 1.018 (ref 1.005–1.030)
pH: 6 (ref 5.0–8.0)

## 2018-01-23 LAB — WET PREP, GENITAL
CLUE CELLS WET PREP: NONE SEEN
Sperm: NONE SEEN
Trich, Wet Prep: NONE SEEN

## 2018-01-23 MED ORDER — TERCONAZOLE 0.4 % VA CREA: 1.0000 | TOPICAL_CREAM | Freq: Every day | VAGINAL | 0 refills | Status: DC

## 2018-01-23 NOTE — MAU Note (Signed)
Woke up to some fluids, ? Leaking, small gush. Going on for several days.  Feels it more at night when she is laying down.  Today she started having some pains. No bleeding. Denies recent intercourse.

## 2018-01-23 NOTE — MAU Provider Note (Signed)
History     CSN: [id number]  Arrival date and time: 01/23/18 1031   First Provider Initiated Contact with Patient 01/23/18 1110      Chief Complaint  Patient presents with  . Rupture of Membranes  . Abdominal Pain   G1 @34 .1 wks here with leaking of fluid. Sx started 2 days ago. Had a couple small gushes of clear fluid. No recent IC. Also reports labial itching and irritation. Reports good FM. Denies VB and ctx. Her pregnancy has been uncomplicated per pt, no records are on file.  OB History    Gravida  1   Para      Term      Preterm      AB      Living        SAB      TAB      Ectopic      Multiple      Live Births              Past Medical History:  Diagnosis Date  . Medical history non-contributory     Past Surgical History:  Procedure Laterality Date  . WISDOM TOOTH EXTRACTION      History reviewed. No pertinent family history.  Social History   Tobacco Use  . Smoking status: Never Smoker  . Smokeless tobacco: Never Used  Substance Use Topics  . Alcohol use: Never    Frequency: Never  . Drug use: Never    Allergies: No Known Allergies  Medications Prior to Admission  Medication Sig Dispense Refill Last Dose  . calcium carbonate (TUMS - DOSED IN MG ELEMENTAL CALCIUM) 500 MG chewable tablet Chew 2 tablets by mouth 4 (four) times daily as needed for indigestion or heartburn.   Past Week at Unknown time  . ferrous sulfate (FERROUSUL) 325 (65 FE) MG tablet Take 1 tablet (325 mg total) by mouth 2 (two) times daily. 60 tablet 1   . ondansetron (ZOFRAN) 4 MG tablet Take 1 tablet (4 mg total) by mouth every 8 (eight) hours as needed for nausea or vomiting. (Patient not taking: Reported on 07/31/2017) 12 tablet 0 Not Taking at Unknown time  . Prenatal Multivit-Min-Fe-FA (PRENATAL VITAMINS PO) Take 1 tablet by mouth daily.   07/31/2017 at Unknown time    Review of Systems  Gastrointestinal: Negative for abdominal pain.  Genitourinary: Positive  for vaginal discharge. Negative for vaginal bleeding.   Physical Exam   Blood pressure 129/88, pulse 98, temperature 98.3 F (36.8 C), temperature source Oral, resp. rate 18, weight 108.5 kg, last menstrual period 05/29/2017, SpO2 100 %.  Physical Exam  Constitutional: She is oriented to person, place, and time. She appears well-developed and well-nourished. No distress.  HENT:  Head: Normocephalic and atraumatic.  Neck: Normal range of motion.  Respiratory: Effort normal. No respiratory distress.  GI: Soft. She exhibits no distension. There is no tenderness.  gravid  Genitourinary:  Genitourinary Comments: SSE: +thick yellow creamy and curdy discharge, no pool, fern neg Visually closed  Musculoskeletal: Normal range of motion.  Neurological: She is alert and oriented to person, place, and time.  Skin: Skin is warm and dry.  Psychiatric: She has a normal mood and affect.  EFM: 140 bpm, mod variability, + accels, NO decels Toco: rare  Results for orders placed or performed during the hospital encounter of 01/23/18 (from the past 24 hour(s))  Urinalysis, Routine w reflex microscopic     Status: Abnormal   Collection Time: 01/23/18  10:08 AM  Result Value Ref Range   Color, Urine YELLOW YELLOW   APPearance HAZY (A) CLEAR   Specific Gravity, Urine 1.018 1.005 - 1.030   pH 6.0 5.0 - 8.0   Glucose, UA NEGATIVE NEGATIVE mg/dL   Hgb urine dipstick NEGATIVE NEGATIVE   Bilirubin Urine NEGATIVE NEGATIVE   Ketones, ur NEGATIVE NEGATIVE mg/dL   Protein, ur NEGATIVE NEGATIVE mg/dL   Nitrite NEGATIVE NEGATIVE   Leukocytes, UA LARGE (A) NEGATIVE   RBC / HPF 0-5 0 - 5 RBC/hpf   WBC, UA 0-5 0 - 5 WBC/hpf   Bacteria, UA RARE (A) NONE SEEN   Squamous Epithelial / LPF 0-5 0 - 5   Mucus PRESENT   Wet prep, genital     Status: Abnormal   Collection Time: 01/23/18 11:18 AM  Result Value Ref Range   Yeast Wet Prep HPF POC PRESENT (A) NONE SEEN   Trich, Wet Prep NONE SEEN NONE SEEN   Clue  Cells Wet Prep HPF POC NONE SEEN NONE SEEN   WBC, Wet Prep HPF POC MANY (A) NONE SEEN   Sperm NONE SEEN     MAU Course  Procedures  MDM Labs ordered and reviewed. No evidence of PROM. Will treat for yeast. Stable for discharge home.  Assessment and Plan  [redacted] weeks gestation Reactive NST Yeast vaginitis Discharge home Rx Terazol Follow up at [facility] next week PTL precautions  Allergies as of 01/23/2018   No Known Allergies     Medication List    STOP taking these medications   ondansetron 4 MG tablet Commonly known as:  ZOFRAN     TAKE these medications   calcium carbonate 500 MG chewable tablet Commonly known as:  TUMS - dosed in mg elemental calcium Chew 2 tablets by mouth 4 (four) times daily as needed for indigestion or heartburn.   ferrous sulfate 325 (65 FE) MG tablet Take 1 tablet (325 mg total) by mouth 2 (two) times daily.   PRENATAL VITAMINS PO Take 1 tablet by mouth daily.   terconazole 0.4 % vaginal cream Commonly known as:  TERAZOL 7 Place 1 applicator vaginally at bedtime.       Donette Larry, CNM 01/23/2018, 11:47 AM

## 2018-01-23 NOTE — Discharge Instructions (Signed)
Vaginal Yeast infection, Adult °Vaginal yeast infection is a condition that causes soreness, swelling, and redness (inflammation) of the vagina. It also causes vaginal discharge. This is a common condition. Some women get this infection frequently. °What are the causes? °This condition is caused by a change in the normal balance of the yeast (candida) and bacteria that live in the vagina. This change causes an overgrowth of yeast, which causes the inflammation. °What increases the risk? °This condition is more likely to develop in: °· Women who take antibiotic medicines. °· Women who have diabetes. °· Women who take birth control pills. °· Women who are pregnant. °· Women who douche often. °· Women who have a weak defense (immune) system. °· Women who have been taking steroid medicines for a long time. °· Women who frequently wear tight clothing. ° °What are the signs or symptoms? °Symptoms of this condition include: °· White, thick vaginal discharge. °· Swelling, itching, redness, and irritation of the vagina. The lips of the vagina (vulva) may be affected as well. °· Pain or a burning feeling while urinating. °· Pain during sex. ° °How is this diagnosed? °This condition is diagnosed with a medical history and physical exam. This will include a pelvic exam. Your health care provider will examine a sample of your vaginal discharge under a microscope. Your health care provider may send this sample for testing to confirm the diagnosis. °How is this treated? °This condition is treated with medicine. Medicines may be over-the-counter or prescription. You may be told to use one or more of the following: °· Medicine that is taken orally. °· Medicine that is applied as a cream. °· Medicine that is inserted directly into the vagina (suppository). ° °Follow these instructions at home: °· Take or apply over-the-counter and prescription medicines only as told by your health care provider. °· Do not have sex until your health  care provider has approved. Tell your sex partner that you have a yeast infection. That person should go to his or her health care provider if he or she develops symptoms. °· Do not wear tight clothes, such as pantyhose or tight pants. °· Avoid using tampons until your health care provider approves. °· Eat more yogurt. This may help to keep your yeast infection from returning. °· Try taking a sitz bath to help with discomfort. This is a warm water bath that is taken while you are sitting down. The water should only come up to your hips and should cover your buttocks. Do this 3-4 times per day or as told by your health care provider. °· Do not douche. °· Wear breathable, cotton underwear. °· If you have diabetes, keep your blood sugar levels under control. °Contact a health care provider if: °· You have a fever. °· Your symptoms go away and then return. °· Your symptoms do not get better with treatment. °· Your symptoms get worse. °· You have new symptoms. °· You develop blisters in or around your vagina. °· You have blood coming from your vagina and it is not your menstrual period. °· You develop pain in your abdomen. °This information is not intended to replace advice given to you by your health care provider. Make sure you discuss any questions you have with your health care provider. °Document Released: 11/29/2004 Document Revised: 08/03/2015 Document Reviewed: 08/23/2014 °Elsevier Interactive Patient Education © 2018 [employer] °Braxton Hicks Contractions °Contractions of the uterus can occur throughout pregnancy, but they are not always a sign that you are   in labor. You may have practice contractions called Braxton Hicks contractions. These false labor contractions are sometimes confused with true labor. °What are Braxton Hicks contractions? °Braxton Hicks contractions are tightening movements that occur in the muscles of the uterus before labor. Unlike true labor contractions, these contractions do not  result in opening (dilation) and thinning of the cervix. Toward the end of pregnancy (32-34 weeks), Braxton Hicks contractions can happen more often and may become stronger. These contractions are sometimes difficult to tell apart from true labor because they can be very uncomfortable. You should not feel embarrassed if you go to the hospital with false labor. °Sometimes, the only way to tell if you are in true labor is for your health care provider to look for changes in the cervix. The health care provider will do a physical exam and may monitor your contractions. If you are not in true labor, the exam should show that your cervix is not dilating and your water has not broken. °If there are other health problems associated with your pregnancy, it is completely safe for you to be sent home with false labor. You may continue to have Braxton Hicks contractions until you go into true labor. °How to tell the difference between true labor and false labor °True labor °· Contractions last 30-70 seconds. °· Contractions become very regular. °· Discomfort is usually felt in the top of the uterus, and it spreads to the lower abdomen and low back. °· Contractions do not go away with walking. °· Contractions usually become more intense and increase in frequency. °· The cervix dilates and gets thinner. °False labor °· Contractions are usually shorter and not as strong as true labor contractions. °· Contractions are usually irregular. °· Contractions are often felt in the front of the lower abdomen and in the groin. °· Contractions may go away when you walk around or change positions while lying down. °· Contractions get weaker and are shorter-lasting as time goes on. °· The cervix usually does not dilate or become thin. °Follow these instructions at home: °· Take over-the-counter and prescription medicines only as told by your health care provider. °· Keep up with your usual exercises and follow other instructions from your  health care provider. °· Eat and drink lightly if you think you are going into labor. °· If Braxton Hicks contractions are making you uncomfortable: °? Change your position from lying down or resting to walking, or change from walking to resting. °? Sit and rest in a tub of warm water. °? Drink enough fluid to keep your urine pale yellow. Dehydration may cause these contractions. °? Do slow and deep breathing several times an hour. °· Keep all follow-up prenatal visits as told by your health care provider. This is important. °Contact a health care provider if: °· You have a fever. °· You have continuous pain in your abdomen. °Get help right away if: °· Your contractions become stronger, more regular, and closer together. °· You have fluid leaking or gushing from your vagina. °· You pass blood-tinged mucus (bloody show). °· You have bleeding from your vagina. °· You have low back pain that you never had before. °· You feel your baby’s head pushing down and causing pelvic pressure. °· Your baby is not moving inside you as much as it used to. °Summary °· Contractions that occur before labor are called Braxton Hicks contractions, false labor, or practice contractions. °· Braxton Hicks contractions are usually shorter, weaker, farther apart, and less regular   than true labor contractions. True labor contractions usually become progressively stronger and regular and they become more frequent. °· Manage discomfort from Braxton Hicks contractions by changing position, resting in a warm bath, drinking plenty of water, or practicing deep breathing. °This information is not intended to replace advice given to you by your health care provider. Make sure you discuss any questions you have with your health care provider. °Document Released: 07/05/2016 Document Revised: 07/05/2016 Document Reviewed: 07/05/2016 °Elsevier Interactive Patient Education © 2018 [employer] ° °

## 2018-01-24 LAB — GC/CHLAMYDIA PROBE AMP (~~LOC~~) NOT AT ARMC
Chlamydia: NEGATIVE
Neisseria Gonorrhea: NEGATIVE

## 2018-02-08 LAB — OB RESULTS CONSOLE GBS: GBS: POSITIVE

## 2018-02-20 ENCOUNTER — Inpatient Hospital Stay (HOSPITAL_COMMUNITY)
Admission: AD | Admit: 2018-02-20 | Discharge: 2018-02-20 | Disposition: A | Discharge status: Home / Self Care | Payer: Medicaid Other | Attending: Obstetrics and Gynecology | Admitting: Obstetrics and Gynecology

## 2018-02-20 ENCOUNTER — Encounter (HOSPITAL_COMMUNITY): Payer: Self-pay

## 2018-02-20 DIAGNOSIS — Z3A38 38 weeks gestation of pregnancy: Secondary | ICD-10-CM | POA: Diagnosis not present

## 2018-02-20 DIAGNOSIS — R109 Unspecified abdominal pain: Secondary | ICD-10-CM | POA: Insufficient documentation

## 2018-02-20 DIAGNOSIS — Z3689 Encounter for other specified antenatal screening: Secondary | ICD-10-CM

## 2018-02-20 DIAGNOSIS — O26893 Other specified pregnancy related conditions, third trimester: Secondary | ICD-10-CM | POA: Diagnosis not present

## 2018-02-20 DIAGNOSIS — O26899 Other specified pregnancy related conditions, unspecified trimester: Secondary | ICD-10-CM

## 2018-02-20 HISTORY — DX: Anemia, unspecified: D64.9

## 2018-02-20 HISTORY — DX: Headache: R51

## 2018-02-20 HISTORY — DX: Headache, unspecified: R51.9

## 2018-02-20 HISTORY — DX: Unspecified infectious disease: B99.9

## 2018-02-20 LAB — URINALYSIS, ROUTINE W REFLEX MICROSCOPIC
Bilirubin Urine: NEGATIVE
Glucose, UA: NEGATIVE mg/dL
HGB URINE DIPSTICK: NEGATIVE
Ketones, ur: NEGATIVE mg/dL
Nitrite: NEGATIVE
PROTEIN: NEGATIVE mg/dL
SPECIFIC GRAVITY, URINE: 1.005 (ref 1.005–1.030)
pH: 6 (ref 5.0–8.0)

## 2018-02-20 NOTE — MAU Note (Signed)
Pt states she turned from her L side over on her stomach & then to her R side during the night.  Started having abdominal pain after that.  Denies bleeding or LOF.  States fetal movement is increased today.

## 2018-02-20 NOTE — MAU Provider Note (Addendum)
History     CSN: [id number]  Arrival date and time: 02/20/18 1449   First Provider Initiated Contact with Patient 02/20/18 1540      Chief Complaint  Patient presents with  . Abdominal Pain   G1 @38 .1 wks here with abd pain. Reports turning over onto her abdomen around 2am when she was trying to change position from left lateral to right. She was sleepy when this happened. Pain started after this. Describes as intermittent and lower. Rates 5/10. Has not taken anything for it. Denies VB, LOF, or ctx. Feeling good FM.    OB History    Gravida  1   Para      Term      Preterm      AB      Living        SAB      TAB      Ectopic      Multiple      Live Births              Past Medical History:  Diagnosis Date  . Anemia   . Headache   . Infection    recurrent yeast infections throughout pregnancy  . Medical history non-contributory     Past Surgical History:  Procedure Laterality Date  . WISDOM TOOTH EXTRACTION      No family history on file.  Social History   Tobacco Use  . Smoking status: Never Smoker  . Smokeless tobacco: Never Used  Substance Use Topics  . Alcohol use: Never    Frequency: Never  . Drug use: Never    Allergies: No Known Allergies  No medications prior to admission.    Review of Systems  Gastrointestinal: Positive for abdominal pain.  Genitourinary: Negative for vaginal bleeding and vaginal discharge.   Physical Exam   Blood pressure 138/80, pulse 92, resp. rate 18, height 5\' 8"  (1.727 m), weight 110.2 kg, last menstrual period 05/29/2017.  Physical Exam  Constitutional: She is oriented to person, place, and time. She appears well-developed and well-nourished. No distress.  HENT:  Head: Normocephalic and atraumatic.  Cardiovascular: Normal rate.  Respiratory: Effort normal. No respiratory distress.  GI: Soft. She exhibits no distension. There is no abdominal tenderness.  gravid  Genitourinary:     Genitourinary Comments: SVE FT/thick   Musculoskeletal: Normal range of motion.  Neurological: She is alert and oriented to person, place, and time.  Skin: Skin is warm and dry.  Psychiatric: She has a normal mood and affect.  EFM: 145 bpm, mod variability, + accels, no decels Toco: none  Results for orders placed or performed during the hospital encounter of 02/20/18 (from the past 24 hour(s))  Urinalysis, Routine w reflex microscopic     Status: Abnormal   Collection Time: 02/20/18  3:21 PM  Result Value Ref Range   Color, Urine YELLOW YELLOW   APPearance CLEAR CLEAR   Specific Gravity, Urine 1.005 1.005 - 1.030   pH 6.0 5.0 - 8.0   Glucose, UA NEGATIVE NEGATIVE mg/dL   Hgb urine dipstick NEGATIVE NEGATIVE   Bilirubin Urine NEGATIVE NEGATIVE   Ketones, ur NEGATIVE NEGATIVE mg/dL   Protein, ur NEGATIVE NEGATIVE mg/dL   Nitrite NEGATIVE NEGATIVE   Leukocytes, UA SMALL (A) NEGATIVE   RBC / HPF 0-5 0 - 5 RBC/hpf   WBC, UA 0-5 0 - 5 WBC/hpf   Bacteria, UA RARE (A) NONE SEEN   Squamous Epithelial / LPF 0-5 0 - 5  MAU Course  Procedures  MDM No evidence of labor or abruption. Tylenol and heat prn comfort. Stable for discharge home.  Assessment and Plan   1. [redacted] weeks gestation of pregnancy   2. NST (non-stress test) reactive   3. Abdominal pain affecting pregnancy    Discharge home Follow up at [facility] next week as scheduled Labor precautions FMCs  Allergies as of 02/20/2018   No Known Allergies     Medication List    TAKE these medications   calcium carbonate 500 MG chewable tablet Commonly known as:  TUMS - dosed in mg elemental calcium Chew 2 tablets by mouth 4 (four) times daily as needed for indigestion or heartburn.   ferrous sulfate 325 (65 FE) MG tablet Commonly known as:  FERROUSUL Take 1 tablet (325 mg total) by mouth 2 (two) times daily.   PRENATAL VITAMINS PO Take 1 tablet by mouth daily.   terconazole 0.4 % vaginal cream Commonly known as:   TERAZOL 7 Place 1 applicator vaginally at bedtime.      Donette LarryMelanie Gianny Sabino, CNM 02/20/2018, 4:48 PM

## 2018-02-20 NOTE — Discharge Instructions (Signed)

## 2018-03-05 NOTE — L&D Delivery Note (Signed)
Delivery Note At 11:22 AM a viable female was delivered via Vaginal, Spontaneous (Presentation: LOA).  APGAR: 9, 9; weight pending.   Placenta status: delivered spontaneously and completely. Cord: three vessels with the following complications: n/a.  Cord pH: Pending   Anesthesia:  None Episiotomy: None Lacerations: 2nd degree;Perineal Suture Repair: 3.0 vicryl Est. Blood Loss (mL):  Final EBL pending, estimate   Mom to postpartum.  Baby to Couplet care / Skin to Skin.  [name] 03/08/2018, 12:05 PM

## 2018-03-06 ENCOUNTER — Telehealth (HOSPITAL_COMMUNITY): Payer: Self-pay | Admitting: *Deleted

## 2018-03-06 ENCOUNTER — Encounter (HOSPITAL_COMMUNITY): Payer: Self-pay | Admitting: *Deleted

## 2018-03-06 NOTE — Telephone Encounter (Signed)
Preadmission screen  

## 2018-03-07 ENCOUNTER — Other Ambulatory Visit: Payer: Self-pay

## 2018-03-07 ENCOUNTER — Encounter (HOSPITAL_COMMUNITY): Payer: Self-pay | Admitting: *Deleted

## 2018-03-07 ENCOUNTER — Inpatient Hospital Stay (HOSPITAL_COMMUNITY)
Admission: AD | Admit: 2018-03-07 | Discharge: 2018-03-10 | DRG: 807 | Disposition: A | Discharge status: Home / Self Care | Payer: Medicaid Other | Attending: Obstetrics and Gynecology | Admitting: Obstetrics and Gynecology

## 2018-03-07 DIAGNOSIS — O329XX0 Maternal care for malpresentation of fetus, unspecified, not applicable or unspecified: Secondary | ICD-10-CM

## 2018-03-07 DIAGNOSIS — O418X10 Other specified disorders of amniotic fluid and membranes, first trimester, not applicable or unspecified: Secondary | ICD-10-CM

## 2018-03-07 DIAGNOSIS — O134 Gestational [pregnancy-induced] hypertension without significant proteinuria, complicating childbirth: Secondary | ICD-10-CM | POA: Diagnosis present

## 2018-03-07 DIAGNOSIS — O133 Gestational [pregnancy-induced] hypertension without significant proteinuria, third trimester: Secondary | ICD-10-CM

## 2018-03-07 DIAGNOSIS — R030 Elevated blood-pressure reading, without diagnosis of hypertension: Secondary | ICD-10-CM | POA: Diagnosis present

## 2018-03-07 DIAGNOSIS — O9081 Anemia of the puerperium: Secondary | ICD-10-CM | POA: Diagnosis not present

## 2018-03-07 DIAGNOSIS — B373 Candidiasis of vulva and vagina: Secondary | ICD-10-CM

## 2018-03-07 DIAGNOSIS — Z3490 Encounter for supervision of normal pregnancy, unspecified, unspecified trimester: Secondary | ICD-10-CM

## 2018-03-07 DIAGNOSIS — B3731 Acute candidiasis of vulva and vagina: Secondary | ICD-10-CM

## 2018-03-07 DIAGNOSIS — Z3A40 40 weeks gestation of pregnancy: Secondary | ICD-10-CM | POA: Diagnosis not present

## 2018-03-07 DIAGNOSIS — O468X1 Other antepartum hemorrhage, first trimester: Secondary | ICD-10-CM

## 2018-03-07 DIAGNOSIS — O99824 Streptococcus B carrier state complicating childbirth: Secondary | ICD-10-CM | POA: Diagnosis present

## 2018-03-07 DIAGNOSIS — O99013 Anemia complicating pregnancy, third trimester: Secondary | ICD-10-CM

## 2018-03-07 DIAGNOSIS — O139 Gestational [pregnancy-induced] hypertension without significant proteinuria, unspecified trimester: Secondary | ICD-10-CM | POA: Diagnosis not present

## 2018-03-07 LAB — URINALYSIS, ROUTINE W REFLEX MICROSCOPIC
Bilirubin Urine: NEGATIVE
Glucose, UA: NEGATIVE mg/dL
Hgb urine dipstick: NEGATIVE
Ketones, ur: NEGATIVE mg/dL
Leukocytes, UA: NEGATIVE
Nitrite: NEGATIVE
Protein, ur: NEGATIVE mg/dL
SPECIFIC GRAVITY, URINE: 1.02 (ref 1.005–1.030)
pH: 6 (ref 5.0–8.0)

## 2018-03-07 LAB — COMPREHENSIVE METABOLIC PANEL
ALT: 68 U/L — ABNORMAL HIGH (ref 0–44)
AST: 47 U/L — ABNORMAL HIGH (ref 15–41)
Albumin: 3 g/dL — ABNORMAL LOW (ref 3.5–5.0)
Alkaline Phosphatase: 187 U/L — ABNORMAL HIGH (ref 38–126)
Anion gap: 8 (ref 5–15)
BUN: 11 mg/dL (ref 6–20)
CO2: 20 mmol/L — ABNORMAL LOW (ref 22–32)
Calcium: 8.7 mg/dL — ABNORMAL LOW (ref 8.9–10.3)
Chloride: 106 mmol/L (ref 98–111)
Creatinine, Ser: 0.74 mg/dL (ref 0.44–1.00)
GFR calc Af Amer: >60 mL/min (ref >60)
Glucose, Bld: 98 mg/dL (ref 70–99)
Potassium: 3.9 mmol/L (ref 3.5–5.1)
Sodium: 134 mmol/L — ABNORMAL LOW (ref 135–145)
TOTAL PROTEIN: 7.5 g/dL (ref 6.5–8.1)
Total Bilirubin: 0.5 mg/dL (ref 0.3–1.2)

## 2018-03-07 LAB — CBC
HEMATOCRIT: 33.1 % — AB (ref 36.0–46.0)
Hemoglobin: 10 g/dL — ABNORMAL LOW (ref 12.0–15.0)
MCH: 22.2 pg — ABNORMAL LOW (ref 26.0–34.0)
MCHC: 30.2 g/dL (ref 30.0–36.0)
MCV: 73.6 fL — AB (ref 80.0–100.0)
Platelets: 314 10*3/uL (ref 150–400)
RBC: 4.5 MIL/uL (ref 3.87–5.11)
RDW: 18.3 % — AB (ref 11.5–15.5)
WBC: 8.5 10*3/uL (ref 4.0–10.5)
nRBC: 0 % (ref 0.0–0.2)

## 2018-03-07 LAB — TYPE AND SCREEN
ABO/RH(D): O POS
Antibody Screen: NEGATIVE

## 2018-03-07 LAB — PROTEIN / CREATININE RATIO, URINE
Creatinine, Urine: 152 mg/dL
Protein Creatinine Ratio: 0.1 mg/mg{Cre} (ref 0.00–0.15)
Total Protein, Urine: 15 mg/dL

## 2018-03-07 LAB — URIC ACID: Uric Acid, Serum: 5.2 mg/dL (ref 2.5–7.1)

## 2018-03-07 LAB — LACTATE DEHYDROGENASE: LDH: 115 U/L (ref 98–192)

## 2018-03-07 MED ORDER — HYDRALAZINE HCL 20 MG/ML IJ SOLN: 10.0000 mg | INTRAMUSCULAR | Status: DC | PRN

## 2018-03-07 MED ORDER — SODIUM CHLORIDE 0.9 % IV SOLN
5.0000 10*6.[IU] | Freq: Once | INTRAVENOUS | Status: AC
  Administered 2018-03-07: 5 10*6.[IU] via INTRAVENOUS
  Filled 2018-03-07: qty 5

## 2018-03-07 MED ORDER — ONDANSETRON HCL 4 MG/2ML IJ SOLN: 4.0000 mg | Freq: Four times a day (QID) | INTRAMUSCULAR | Status: DC | PRN

## 2018-03-07 MED ORDER — OXYCODONE-ACETAMINOPHEN 5-325 MG PO TABS: 2.0000 | ORAL_TABLET | ORAL | Status: DC | PRN

## 2018-03-07 MED ORDER — ACETAMINOPHEN 325 MG PO TABS: 650.0000 mg | ORAL_TABLET | ORAL | Status: DC | PRN

## 2018-03-07 MED ORDER — OXYTOCIN BOLUS FROM INFUSION
500.0000 mL | Freq: Once | INTRAVENOUS | Status: AC
  Administered 2018-03-08: 500 mL via INTRAVENOUS

## 2018-03-07 MED ORDER — LABETALOL HCL 5 MG/ML IV SOLN: 40.0000 mg | INTRAVENOUS | Status: DC | PRN

## 2018-03-07 MED ORDER — FENTANYL CITRATE (PF) 100 MCG/2ML IJ SOLN
50.0000 ug | INTRAMUSCULAR | Status: DC | PRN
  Administered 2018-03-08: 100 ug via INTRAVENOUS
  Filled 2018-03-07: qty 2

## 2018-03-07 MED ORDER — LACTATED RINGERS IV SOLN
INTRAVENOUS | Status: DC | PRN
  Administered 2018-03-08: 04:00:00 via INTRAVENOUS

## 2018-03-07 MED ORDER — SOD CITRATE-CITRIC ACID 500-334 MG/5ML PO SOLN: 30.0000 mL | ORAL | Status: DC | PRN

## 2018-03-07 MED ORDER — LACTATED RINGERS IV SOLN: 500.0000 mL | INTRAVENOUS | Status: DC | PRN

## 2018-03-07 MED ORDER — MISOPROSTOL 25 MCG QUARTER TABLET
25.0000 ug | ORAL_TABLET | ORAL | Status: DC | PRN
  Administered 2018-03-07: 25 ug via VAGINAL
  Filled 2018-03-07 (×3): qty 1

## 2018-03-07 MED ORDER — PENICILLIN G 3 MILLION UNITS IVPB - SIMPLE MED
3.0000 10*6.[IU] | INTRAVENOUS | Status: DC
  Administered 2018-03-07 – 2018-03-08 (×4): 3 10*6.[IU] via INTRAVENOUS
  Filled 2018-03-07 (×7): qty 100

## 2018-03-07 MED ORDER — TERBUTALINE SULFATE 1 MG/ML IJ SOLN: 0.2500 mg | Freq: Once | INTRAMUSCULAR | Status: DC | PRN

## 2018-03-07 MED ORDER — SODIUM CHLORIDE 0.9% FLUSH: 3.0000 mL | INTRAVENOUS | Status: DC | PRN

## 2018-03-07 MED ORDER — LIDOCAINE HCL (PF) 1 % IJ SOLN
30.0000 mL | INTRAMUSCULAR | Status: DC | PRN
  Administered 2018-03-08: 30 mL via SUBCUTANEOUS
  Filled 2018-03-07: qty 30

## 2018-03-07 MED ORDER — OXYTOCIN 40 UNITS IN LACTATED RINGERS INFUSION - SIMPLE MED
1.0000 m[IU]/min | INTRAVENOUS | Status: DC
  Filled 2018-03-07: qty 1000

## 2018-03-07 MED ORDER — SODIUM CHLORIDE 0.9 % IV SOLN: 250.0000 mL | INTRAVENOUS | Status: DC | PRN

## 2018-03-07 MED ORDER — LABETALOL HCL 5 MG/ML IV SOLN: 20.0000 mg | INTRAVENOUS | Status: DC | PRN

## 2018-03-07 MED ORDER — TERBUTALINE SULFATE 1 MG/ML IJ SOLN
0.2500 mg | Freq: Once | INTRAMUSCULAR | Status: DC | PRN
  Filled 2018-03-07: qty 1

## 2018-03-07 MED ORDER — SODIUM CHLORIDE 0.9% FLUSH: 3.0000 mL | Freq: Two times a day (BID) | INTRAVENOUS | Status: DC

## 2018-03-07 MED ORDER — OXYCODONE-ACETAMINOPHEN 5-325 MG PO TABS: 1.0000 | ORAL_TABLET | ORAL | Status: DC | PRN

## 2018-03-07 MED ORDER — OXYTOCIN 40 UNITS IN LACTATED RINGERS INFUSION - SIMPLE MED: 2.5000 [IU]/h | INTRAVENOUS | Status: DC

## 2018-03-07 MED ORDER — LABETALOL HCL 5 MG/ML IV SOLN: 80.0000 mg | INTRAVENOUS | Status: DC | PRN

## 2018-03-07 NOTE — Progress Notes (Signed)
Subjective: Having some bloody show.  Breathing with contractions.  Objective: BP 128/63   Pulse 88   Temp 98.7 F (37.1 C) (Oral)   Resp 18   Ht 5' 7.25" (1.708 m)   Wt 110.1 kg   LMP 05/29/2017   SpO2 100%   BMI 37.73 kg/m  No intake/output data recorded. No intake/output data recorded.  FHT: Category 1 FHT 145 variability present. Variable decels resolved with bolus and position change UC:   regular, every 2-6 minutes SVE:   Dilation: 4 Effacement (%): 70 Station: -1 Exam by:: Bernerd Pho, CNM  Assessment:  40.2 IUP induction in early labor Cat 1 strip  Plan: Pitocin augmentation   Kenney Houseman CNM, MSN 03/07/2018, 10:58 PM

## 2018-03-07 NOTE — H&P (Signed)
Daisey Bradly BienenstockMartinez is a 28 y.o. female presenting for induction of labor for gestational hypertension vs preeclampsia per Dr. Su Hiltoberts. OB History    Gravida  1   Para      Term      Preterm      AB      Living        SAB      TAB      Ectopic      Multiple      Live Births             Past Medical History:  Diagnosis Date  . Abscess of vulva   . Anemia   . Headache   . HPV (human papilloma virus) anogenital infection   . Infection    recurrent yeast infections throughout pregnancy  . Medical history non-contributory    Past Surgical History:  Procedure Laterality Date  . hemorrhoid ligation    . WISDOM TOOTH EXTRACTION     Family History: family history is not on file. Social History:  reports that she has never smoked. She has never used smokeless tobacco. She reports that she does not drink alcohol or use drugs.     Maternal Diabetes: No Genetic Screening: Declined Maternal Ultrasounds/Referrals: Normal Fetal Ultrasounds or other Referrals:  None Maternal Substance Abuse:  No Significant Maternal Medications:  None Significant Maternal Lab Results:  Lab values include: Group B Strep positive Other Comments:  None  Review of Systems  Eyes: Negative for blurred vision and double vision.  Gastrointestinal: Negative for abdominal pain.  Genitourinary:       Pelvic pain and pressure   Neurological: Positive for dizziness. Negative for headaches.  All other systems reviewed and are negative.  Maternal Medical History:  Fetal activity: Perceived fetal activity is normal.   Last perceived fetal movement was within the past hour.    Prenatal Complications - Diabetes: none.    Dilation: 1 Effacement (%): 50 Station: -2 Exam by:: Neli Fofana, CNM   Vitals:   03/07/18 1617 03/07/18 1631 03/07/18 1646 03/07/18 1701  BP: (!) 143/87 (!) 142/87 (!) 142/86 137/87  Pulse: 95 96 91 96  Resp:      Temp:      SpO2:      Weight:       Results for orders  placed or performed during the hospital encounter of 03/07/18 (from the past 24 hour(s))  CBC     Status: Abnormal   Collection Time: 03/07/18  4:04 PM  Result Value Ref Range   WBC 8.5 4.0 - 10.5 K/uL   RBC 4.50 3.87 - 5.11 MIL/uL   Hemoglobin 10.0 (L) 12.0 - 15.0 g/dL   HCT 52.833.1 (L) 41.336.0 - 24.446.0 %   MCV 73.6 (L) 80.0 - 100.0 fL   MCH 22.2 (L) 26.0 - 34.0 pg   MCHC 30.2 30.0 - 36.0 g/dL   RDW 01.018.3 (H) 27.211.5 - 53.615.5 %   Platelets 314 150 - 400 K/uL   nRBC 0.0 0.0 - 0.2 %  Comprehensive metabolic panel     Status: Abnormal   Collection Time: 03/07/18  4:04 PM  Result Value Ref Range   Sodium 134 (L) 135 - 145 mmol/L   Potassium 3.9 3.5 - 5.1 mmol/L   Chloride 106 98 - 111 mmol/L   CO2 20 (L) 22 - 32 mmol/L   Glucose, Bld 98 70 - 99 mg/dL   BUN 11 6 - 20 mg/dL   Creatinine, Ser 6.440.74  0.44 - 1.00 mg/dL   Calcium 8.7 (L) 8.9 - 10.3 mg/dL   Total Protein 7.5 6.5 - 8.1 g/dL   Albumin 3.0 (L) 3.5 - 5.0 g/dL   AST 47 (H) 15 - 41 U/L   ALT 68 (H) 0 - 44 U/L   Alkaline Phosphatase 187 (H) 38 - 126 U/L   Total Bilirubin 0.5 0.3 - 1.2 mg/dL   GFR calc non Af Amer >60 >60 mL/min   GFR calc Af Amer >60 >60 mL/min   Anion gap 8 5 - 15  Uric acid     Status: None   Collection Time: 03/07/18  4:04 PM  Result Value Ref Range   Uric Acid, Serum 5.2 2.5 - 7.1 mg/dL  Lactate dehydrogenase     Status: None   Collection Time: 03/07/18  4:04 PM  Result Value Ref Range   LDH 115 98 - 192 U/L  Protein / creatinine ratio, urine     Status: None   Collection Time: 03/07/18  4:14 PM  Result Value Ref Range   Creatinine, Urine 152.00 mg/dL   Total Protein, Urine 15 mg/dL   Protein Creatinine Ratio 0.10 0.00 - 0.15 mg/mg[Cre]  Urinalysis, Routine w reflex microscopic     Status: None   Collection Time: 03/07/18  4:14 PM  Result Value Ref Range   Color, Urine YELLOW YELLOW   APPearance CLEAR CLEAR   Specific Gravity, Urine 1.020 1.005 - 1.030   pH 6.0 5.0 - 8.0   Glucose, UA NEGATIVE NEGATIVE  mg/dL   Hgb urine dipstick NEGATIVE NEGATIVE   Bilirubin Urine NEGATIVE NEGATIVE   Ketones, ur NEGATIVE NEGATIVE mg/dL   Protein, ur NEGATIVE NEGATIVE mg/dL   Nitrite NEGATIVE NEGATIVE   Leukocytes, UA NEGATIVE NEGATIVE     Maternal Exam:  Abdomen: Fundal height is Size=dates.   Estimated fetal weight is 7.5lbs.   Fetal presentation: vertex  Introitus: Normal vulva. Normal vagina.  Pelvis: adequate for delivery.   Cervix: Cervix evaluated by digital exam.     Fetal Exam Fetal Monitor Review: Baseline rate: 130s.  Variability: moderate (6-25 bpm).   Pattern: accelerations present and no decelerations.    Fetal State Assessment: Category I - tracings are normal.     Physical Exam  Nursing note and vitals reviewed. Constitutional: She is oriented to person, place, and time. She appears well-developed and well-nourished.  HENT:  Head: Normocephalic and atraumatic.  Eyes: Pupils are equal, round, and reactive to light.  Cardiovascular: Normal rate, regular rhythm and normal heart sounds.  Respiratory: Effort normal and breath sounds normal. No respiratory distress.  GI: There is no abdominal tenderness.  Genitourinary:    Vulva, vagina and uterus normal.   Musculoskeletal: Normal range of motion.  Neurological: She is alert and oriented to person, place, and time.  Skin: Skin is warm and dry.  Psychiatric: She has a normal mood and affect. Her behavior is normal. Judgment and thought content normal.    Prenatal labs: ABO, Rh: O/Positive/-- (06/13 0000) Antibody: Negative (06/13 0000) Rubella: Immune (06/13 0000) RPR: Nonreactive (06/13 0000)  HBsAg: Negative (06/13 0000)  HIV: Non-reactive (06/13 0000)  GBS: Positive (12/07 0000)   Assessment/Plan: 28 y.o. G1P0 at [redacted]w[redacted]d Gestational hypertension vs. Preeclampsia without severe features Category 1 FHTs, reactive NST Admit to L&D for induction of labor per Dr. Su Hilt [facility] labs: AST/ALT elevated, protein creatinine  ratio pending  GBS Positive, penicillin in labor Anticipate cytotec induction of labor Anticipate NSVD   Rennis Harding  Fidela Salisbury 03/07/2018, 6:13 PM

## 2018-03-07 NOTE — MAU Note (Signed)
Started last night with the dizziness, that is the main reason she came in.  Pelvic pain and pressure, started this morning. Does have a sinus infection

## 2018-03-07 NOTE — MAU Note (Signed)
Denies HA, visual changes, epigastric pain or increase in swelling.  

## 2018-03-07 NOTE — Progress Notes (Signed)
Subjective: Occ contractions. States is starting to feel them. Objective: BP 132/82   Pulse 90   Temp 98.7 F (37.1 C) (Oral)   Resp 18   Ht 5' 7.25" (1.708 m)   Wt 110.1 kg   LMP 05/29/2017   SpO2 100%   BMI 37.73 kg/m  No intake/output data recorded. No intake/output data recorded.  FHT: Category 1 UC:   irregular, every 7 minutes SVE:   Dilation: 1 Effacement (%): 50 Station: -2 Exam by:: Rennis Harding, CNM  Assessment:  G1P0 40.2 IUP induction for borderline BP Cat 1 strip GBS positive  Plan: Discussed foley bulb and cytotec.  Pt declines foley bulb placement and would like to continue with cytotec. Next dose due at [address] Samantha Alexander CNM, MSN 03/07/2018, 8:39 PM

## 2018-03-07 NOTE — MAU Provider Note (Signed)
History    Samantha Alexander is a 27y.o. G1P0 at 40.2wks who presents for dizziness.  Patient states she has been having bouts of dizziness that lasts ~30 minutes.  She endorses adequate water intake and denies HA, visual disturbances, SOB, and numbs/tingling.  Patient denies issues during this pregnancy and endorses good fetal movement.  She also reports intermittent contractions, but denies LoF and VB.    Patient Active Problem List   Diagnosis Date Noted  . Anemia complicating pregnancy 07/31/2017  . Candida vaginitis 07/11/2017  . Subchorionic hematoma in first trimester 07/11/2017  . Intrauterine pregnancy 07/11/2017    Chief Complaint  Patient presents with  . Dizziness  . Pelvic Pain   HPI  OB History    Gravida  1   Para      Term      Preterm      AB      Living        SAB      TAB      Ectopic      Multiple      Live Births              Past Medical History:  Diagnosis Date  . Abscess of vulva   . Anemia   . Headache   . HPV (human papilloma virus) anogenital infection   . Infection    recurrent yeast infections throughout pregnancy  . Medical history non-contributory     Past Surgical History:  Procedure Laterality Date  . hemorrhoid ligation    . WISDOM TOOTH EXTRACTION      History reviewed. No pertinent family history.  Social History   Tobacco Use  . Smoking status: Never Smoker  . Smokeless tobacco: Never Used  Substance Use Topics  . Alcohol use: Never    Frequency: Never  . Drug use: Never    Allergies: No Known Allergies  Medications Prior to Admission  Medication Sig Dispense Refill Last Dose  . terconazole (TERAZOL 7) 0.4 % vaginal cream Place 1 applicator vaginally at bedtime. 45 g 0 Past Week at Unknown time  . ferrous sulfate (FERROUSUL) 325 (65 FE) MG tablet Take 1 tablet (325 mg total) by mouth 2 (two) times daily. (Patient not taking: Reported on 03/07/2018) 60 tablet 1 Not Taking at Unknown time    ROS  See  HPI Above Physical Exam   Blood pressure (!) 150/94, pulse (!) 113, temperature 98.4 F (36.9 C), resp. rate 18, weight 110.1 kg, last menstrual period 05/29/2017, SpO2 100 %.   Vitals:   03/07/18 1611 03/07/18 1613  BP: (!) 148/98 (!) 150/94  Pulse: (!) 120 (!) 113  Resp:    Temp:    SpO2:      Results for orders placed or performed during the hospital encounter of 03/07/18 (from the past 24 hour(s))  CBC     Status: Abnormal   Collection Time: 03/07/18  4:04 PM  Result Value Ref Range   WBC 8.5 4.0 - 10.5 K/uL   RBC 4.50 3.87 - 5.11 MIL/uL   Hemoglobin 10.0 (L) 12.0 - 15.0 g/dL   HCT 71.2 (L) 45.8 - 09.9 %   MCV 73.6 (L) 80.0 - 100.0 fL   MCH 22.2 (L) 26.0 - 34.0 pg   MCHC 30.2 30.0 - 36.0 g/dL   RDW 83.3 (H) 82.5 - 05.3 %   Platelets 314 150 - 400 K/uL   nRBC 0.0 0.0 - 0.2 %  Comprehensive metabolic panel  Status: Abnormal   Collection Time: 03/07/18  4:04 PM  Result Value Ref Range   Sodium 134 (L) 135 - 145 mmol/L   Potassium 3.9 3.5 - 5.1 mmol/L   Chloride 106 98 - 111 mmol/L   CO2 20 (L) 22 - 32 mmol/L   Glucose, Bld 98 70 - 99 mg/dL   BUN 11 6 - 20 mg/dL   Creatinine, Ser 2.11 0.44 - 1.00 mg/dL   Calcium 8.7 (L) 8.9 - 10.3 mg/dL   Total Protein 7.5 6.5 - 8.1 g/dL   Albumin 3.0 (L) 3.5 - 5.0 g/dL   AST 47 (H) 15 - 41 U/L   ALT 68 (H) 0 - 44 U/L   Alkaline Phosphatase 187 (H) 38 - 126 U/L   Total Bilirubin 0.5 0.3 - 1.2 mg/dL   GFR calc non Af Amer >60 >60 mL/min   GFR calc Af Amer >60 >60 mL/min   Anion gap 8 5 - 15  Urinalysis, Routine w reflex microscopic     Status: None   Collection Time: 03/07/18  4:14 PM  Result Value Ref Range   Color, Urine YELLOW YELLOW   APPearance CLEAR CLEAR   Specific Gravity, Urine 1.020 1.005 - 1.030   pH 6.0 5.0 - 8.0   Glucose, UA NEGATIVE NEGATIVE mg/dL   Hgb urine dipstick NEGATIVE NEGATIVE   Bilirubin Urine NEGATIVE NEGATIVE   Ketones, ur NEGATIVE NEGATIVE mg/dL   Protein, ur NEGATIVE NEGATIVE mg/dL   Nitrite  NEGATIVE NEGATIVE   Leukocytes, UA NEGATIVE NEGATIVE    Physical Exam  Constitutional: She is oriented to person, place, and time. She appears well-developed and well-nourished.  HENT:  Head: Normocephalic and atraumatic.  Eyes: Conjunctivae are normal.  Neck: Normal range of motion.  Cardiovascular: Normal rate, regular rhythm and normal heart sounds.  Respiratory: Effort normal and breath sounds normal.  GI: Soft. Bowel sounds are normal.  Musculoskeletal: Normal range of motion.  Neurological: She is oriented to person, place, and time.  Skin: Skin is warm and dry.  Psychiatric: She has a normal mood and affect. Her behavior is normal.     FHR: 145 bpm, Mod Var, -Decels, +Accels UC: None palpated ED Course  Assessment: IUP at 40.2wks Cat I FT GHTN  Plan: -PIH Labs drawn AST/ALT Elevated -In room to discuss labs and bp with patient -Informed of recommendation for IOL r/t GHTN -Informed that diagnosis may change to PreEclampsia dependent upon PC Ratio results -Patient verbalized understanding and without questions or concerns -[facility] Provider, Ethelene Browns contacted and informed of recommendation. *Requests BS [name] to confirm presentation  1714: [name] confirms vertex Care released to [facility] provider  Cherre Robins CNM, MSN 03/07/2018 5:00 PM

## 2018-03-07 NOTE — Anesthesia Pain Management Evaluation Note (Signed)
  CRNA Pain Management Visit Note  Patient: Samantha Alexander, 28 y.o., female  "Hello I am a member of the anesthesia team at [facility]. We have an anesthesia team available at all times to provide care throughout the hospital, including epidural management and anesthesia for C-section. I don't know your plan for the delivery whether it a natural birth, water birth, IV sedation, nitrous supplementation, doula or epidural, but we want to meet your pain goals."   1.Was your pain managed to your expectations on prior hospitalizations?   No prior hospitalizations  2.What is your expectation for pain management during this hospitalization?     Labor support without medications and IV pain meds  3.How can we help you reach that goal? available  Record the patient's initial score and the patient's pain goal.   Pain: 3  Pain Goal: 10 [facility] wants you to be able to say your pain was always managed very well.  Samantha Alexander 03/07/2018

## 2018-03-08 ENCOUNTER — Encounter (HOSPITAL_COMMUNITY): Payer: Self-pay | Admitting: Family Medicine

## 2018-03-08 LAB — RPR: RPR Ser Ql: NONREACTIVE

## 2018-03-08 MED ORDER — BUTORPHANOL TARTRATE 1 MG/ML IJ SOLN
1.0000 mg | INTRAMUSCULAR | Status: DC | PRN
  Administered 2018-03-08: 1 mg via INTRAVENOUS
  Filled 2018-03-08: qty 1

## 2018-03-08 MED ORDER — OXYCODONE HCL 5 MG PO TABS
5.0000 mg | ORAL_TABLET | ORAL | Status: DC | PRN
  Administered 2018-03-08 – 2018-03-10 (×2): 5 mg via ORAL
  Filled 2018-03-08 (×2): qty 1

## 2018-03-08 MED ORDER — ONDANSETRON HCL 4 MG PO TABS: 4.0000 mg | ORAL_TABLET | ORAL | Status: DC | PRN

## 2018-03-08 MED ORDER — IBUPROFEN 600 MG PO TABS
600.0000 mg | ORAL_TABLET | Freq: Four times a day (QID) | ORAL | Status: DC
  Administered 2018-03-08 – 2018-03-10 (×8): 600 mg via ORAL
  Filled 2018-03-08 (×8): qty 1

## 2018-03-08 MED ORDER — TETANUS-DIPHTH-ACELL PERTUSSIS 5-2.5-18.5 LF-MCG/0.5 IM SUSP: 0.5000 mL | Freq: Once | INTRAMUSCULAR | Status: DC

## 2018-03-08 MED ORDER — DIBUCAINE 1 % RE OINT: 1.0000 "application " | TOPICAL_OINTMENT | RECTAL | Status: DC | PRN

## 2018-03-08 MED ORDER — TERBUTALINE SULFATE 1 MG/ML IJ SOLN
0.2500 mg | Freq: Once | INTRAMUSCULAR | Status: DC | PRN
  Filled 2018-03-08: qty 1

## 2018-03-08 MED ORDER — ZOLPIDEM TARTRATE 5 MG PO TABS: 5.0000 mg | ORAL_TABLET | Freq: Every evening | ORAL | Status: DC | PRN

## 2018-03-08 MED ORDER — ONDANSETRON HCL 4 MG/2ML IJ SOLN: 4.0000 mg | INTRAMUSCULAR | Status: DC | PRN

## 2018-03-08 MED ORDER — WITCH HAZEL-GLYCERIN EX PADS: 1.0000 "application " | MEDICATED_PAD | CUTANEOUS | Status: DC | PRN

## 2018-03-08 MED ORDER — SENNOSIDES-DOCUSATE SODIUM 8.6-50 MG PO TABS
2.0000 | ORAL_TABLET | ORAL | Status: DC
  Administered 2018-03-08 – 2018-03-09 (×2): 2 via ORAL
  Filled 2018-03-08 (×2): qty 2

## 2018-03-08 MED ORDER — BENZOCAINE-MENTHOL 20-0.5 % EX AERO
1.0000 "application " | INHALATION_SPRAY | CUTANEOUS | Status: DC | PRN
  Administered 2018-03-09: 1 via TOPICAL
  Filled 2018-03-08: qty 56

## 2018-03-08 MED ORDER — DIPHENHYDRAMINE HCL 25 MG PO CAPS
25.0000 mg | ORAL_CAPSULE | Freq: Four times a day (QID) | ORAL | Status: DC | PRN
  Filled 2018-03-08: qty 1

## 2018-03-08 MED ORDER — COCONUT OIL OIL: 1.0000 "application " | TOPICAL_OIL | Status: DC | PRN

## 2018-03-08 MED ORDER — SIMETHICONE 80 MG PO CHEW
80.0000 mg | CHEWABLE_TABLET | ORAL | Status: DC | PRN
  Administered 2018-03-08: 80 mg via ORAL
  Filled 2018-03-08: qty 1

## 2018-03-08 MED ORDER — DIPHENHYDRAMINE HCL 25 MG PO CAPS: 25.0000 mg | ORAL_CAPSULE | Freq: Four times a day (QID) | ORAL | Status: DC | PRN

## 2018-03-08 MED ORDER — OXYTOCIN 40 UNITS IN LACTATED RINGERS INFUSION - SIMPLE MED
1.0000 m[IU]/min | INTRAVENOUS | Status: DC
  Administered 2018-03-08: 2 m[IU]/min via INTRAVENOUS

## 2018-03-08 MED ORDER — ACETAMINOPHEN 325 MG PO TABS
650.0000 mg | ORAL_TABLET | ORAL | Status: DC | PRN
  Administered 2018-03-08 – 2018-03-09 (×3): 650 mg via ORAL
  Filled 2018-03-08 (×3): qty 2

## 2018-03-08 MED ORDER — PRENATAL MULTIVITAMIN CH
1.0000 | ORAL_TABLET | Freq: Every day | ORAL | Status: DC
  Administered 2018-03-09 – 2018-03-10 (×2): 1 via ORAL
  Filled 2018-03-08 (×2): qty 1

## 2018-03-08 NOTE — Progress Notes (Signed)
Samantha Alexander is a 28 y.o. G1P0 at [redacted]w[redacted]d admitted for induction of labor due to Hypertension.  Subjective: Patient is breathing through contractions and using nitrous. She did not like the IV pain medication and doesn't desire an epidural.   Objective: Vitals:   03/08/18 0705 03/08/18 0707 03/08/18 0745 03/08/18 0817  BP:  116/83 (!) 134/91 138/83  Pulse:  84 79 77  Resp:  18 (!) 22 20  Temp:   98 F (36.7 C)   TempSrc:   Axillary   SpO2: 99%     Weight:      Height:       FHT:  FHR: 120 bpm, variability: moderate,  accelerations:  Present,  decelerations:  Absent UC:   irregular, every 1-3 minutes SVE:   Dilation: 7 Effacement (%): 100 Station: 0 Exam by:: Samantha Alexander, CNM  Labs: Lab Results  Component Value Date   WBC 8.5 03/07/2018   HGB 10.0 (L) 03/07/2018   HCT 33.1 (L) 03/07/2018   MCV 73.6 (L) 03/07/2018   PLT 314 03/07/2018   Assessment / Plan: Induction of labor due to gestational hypertension,  progressing well on pitocin  Labor: Progressing normally Preeclampsia:  no signs or symptoms of toxicity, intake and ouput balanced and labs stable Fetal Wellbeing:  Category I Pain Control:  Labor support without medications and Nitrous Oxide I/D:  n/a Anticipated MOD:  NSVD  Samantha Alexander 03/08/2018, 8:22 AM

## 2018-03-08 NOTE — Progress Notes (Signed)
Subjective:  Pt had itching after Fentanyl.  No SOB or hives.  Objective: BP 139/83   Pulse 86   Temp 98.2 F (36.8 C) (Oral)   Resp 16   Ht 5' 7.25" (1.708 m)   Wt 110.1 kg   LMP 05/29/2017   SpO2 100%   BMI 37.73 kg/m  No intake/output data recorded. No intake/output data recorded.  FHT: Category 1 FHT 135 accels, no decels UC:   regular, every 2-3 minutes SVE:   Dilation: 4 Effacement (%): 100 Station: 0 Exam by:: N. Prothero, CNM  Assessment:  G1P0 40.2 IUP induction for borderline BP Cat 1 strip GBS positive  Plan: Discussed use of other pain medication of Stadol. Benadryl offered.  Discussed Nitrous and Epidural. Pt declined Benadryl at this point. Monitor progress  Henderson Newcomer Prothero CNM, MSN 03/08/2018, 1:56 AM

## 2018-03-08 NOTE — Progress Notes (Signed)
Jaeden Trifiletti is a 28 y.o. G1P0 at [redacted]w[redacted]d admitted for induction of labor due to Hypertension.  Subjective: Patient is breathing through contractions and received IV pain medication. She reports pressure with contractions.  Objective: Vitals:   03/08/18 0857 03/08/18 0900 03/08/18 0930 03/08/18 1000  BP: (!) 115/50 132/77 (!) 142/84 133/78  Pulse: 73 69 78 74  Resp: 20 20 20 18   Temp:   97.9 F (36.6 C)   TempSrc:   Oral   SpO2:      Weight:      Height:       FHT:  FHR: 120 bpm, variability: moderate,  accelerations:  Present,  decelerations:  Present occasional mild variable decels UC:   irregular, every 1-3 minutes SVE:   Dilation: 7.5 Effacement (%): 100 Station: 0 Exam by:: Waynette Buttery, CNM  Labs: Lab Results  Component Value Date   WBC 8.5 03/07/2018   HGB 10.0 (L) 03/07/2018   HCT 33.1 (L) 03/07/2018   MCV 73.6 (L) 03/07/2018   PLT 314 03/07/2018   Assessment / Plan: Induction of labor due to gestational hypertension,  progressing well on pitocin  Labor: Progressing normally Preeclampsia:  no signs or symptoms of toxicity, intake and ouput balanced and labs stable Fetal Wellbeing:  Category I Pain Control:  IV pain meds I/D:  n/a Anticipated MOD:  NSVD  [name] 03/08/2018, 10:07 AM

## 2018-03-08 NOTE — Progress Notes (Signed)
Subjective:  Pt using nitrous.  Breathing with contractions. Objective: BP (!) 144/88   Pulse 79   Temp 98.1 F (36.7 C) (Oral)   Resp 16   Ht 5' 7.25" (1.708 m)   Wt 110.1 kg   LMP 05/29/2017   SpO2 99%   BMI 37.73 kg/m  No intake/output data recorded. No intake/output data recorded.  FHT: Category 1 FHTS 130 accels, occ decels noted.   UC:   regular, every 1-4 minutes palpate mild to mod SVE:   Dilation: 4 Effacement (%): 100 Station: 0 Exam by:: Lubertha Basque, CNM Discussed augmentation with arom and pitocin.  Risk and benefits discussed.  Pt verbalized understanding.  AROM clear fluid.  Will start pitocin due to slow progress  Assessment:  G1P0 40.2 IUP induction for borderline BP inadequate contractions Cat 1 strip GBS positive  Plan: Start pitocin will evaluate in 2 hours if not change will place IUPC to assess strength of contractions.  Kenney Houseman CNM, MSN 03/08/2018, 5:17 AM

## 2018-03-09 LAB — COMPREHENSIVE METABOLIC PANEL
ALT: 59 U/L — ABNORMAL HIGH (ref 0–44)
AST: 43 U/L — ABNORMAL HIGH (ref 15–41)
Albumin: 2.6 g/dL — ABNORMAL LOW (ref 3.5–5.0)
Alkaline Phosphatase: 150 U/L — ABNORMAL HIGH (ref 38–126)
Anion gap: 7 (ref 5–15)
BUN: 11 mg/dL (ref 6–20)
CHLORIDE: 103 mmol/L (ref 98–111)
CO2: 24 mmol/L (ref 22–32)
Calcium: 8.6 mg/dL — ABNORMAL LOW (ref 8.9–10.3)
Creatinine, Ser: 0.71 mg/dL (ref 0.44–1.00)
GFR calc Af Amer: >60 mL/min (ref >60)
GFR calc non Af Amer: >60 mL/min (ref >60)
Glucose, Bld: 106 mg/dL — ABNORMAL HIGH (ref 70–99)
Potassium: 3.9 mmol/L (ref 3.5–5.1)
Sodium: 134 mmol/L — ABNORMAL LOW (ref 135–145)
Total Bilirubin: 0.5 mg/dL (ref 0.3–1.2)
Total Protein: 6.2 g/dL — ABNORMAL LOW (ref 6.5–8.1)

## 2018-03-09 LAB — CBC
HCT: 27.1 % — ABNORMAL LOW (ref 36.0–46.0)
Hemoglobin: 8.3 g/dL — ABNORMAL LOW (ref 12.0–15.0)
MCH: 22.4 pg — ABNORMAL LOW (ref 26.0–34.0)
MCHC: 30.6 g/dL (ref 30.0–36.0)
MCV: 73.2 fL — ABNORMAL LOW (ref 80.0–100.0)
PLATELETS: 269 10*3/uL (ref 150–400)
RBC: 3.7 MIL/uL — ABNORMAL LOW (ref 3.87–5.11)
RDW: 18.3 % — ABNORMAL HIGH (ref 11.5–15.5)
WBC: 15.9 10*3/uL — ABNORMAL HIGH (ref 4.0–10.5)
nRBC: 0 % (ref 0.0–0.2)

## 2018-03-09 MED ORDER — FERROUS SULFATE 325 (65 FE) MG PO TABS
325.0000 mg | ORAL_TABLET | Freq: Two times a day (BID) | ORAL | Status: DC
  Administered 2018-03-10: 325 mg via ORAL
  Filled 2018-03-09: qty 1

## 2018-03-09 NOTE — Progress Notes (Signed)
Subjective: Postpartum Day 1: Vaginal delivery, 2nd laceration Patient up ad lib, reports no syncope, but feels lightheaded. Requested BP check, "doesn't feel good". Denies epigastric pain and visual changes. Endorses 5/10 headache. Had ibuprofen ~3h ago. Feeding:  Breast. Wrorked with hand expression and LC to BS for latch work. Contraceptive plan: NONE. Advised against early pregnancy.  Objective: Vital signs in last 24 hours: Temp:  [97.8 F (36.6 C)-98.7 F (37.1 C)] 97.9 F (36.6 C) (01/05 1451) Pulse Rate:  [81-103] 92 (01/05 1544) Resp:  [16-18] 18 (01/05 1544) BP: (112-130)/(69-81) 117/80 (01/05 1544) SpO2:  [100 %] 100 % (01/05 1451)  Physical Exam:  General: alert, cooperative and no distress Lochia: appropriate Uterine Fundus: firm Perineum: healing well DVT Evaluation: No evidence of DVT seen on physical exam.   CBC Latest Ref Rng & Units 03/09/2018 03/07/2018 07/14/2017  WBC 4.0 - 10.5 K/uL 15.9(H) 8.5 10.8(H)  Hemoglobin 12.0 - 15.0 g/dL 8.3(L) 10.0(L) 10.3(L)  Hematocrit 36.0 - 46.0 % 27.1(L) 33.1(L) 33.8(L)  Platelets 150 - 400 K/uL 269 314 422(H)     Assessment/Plan: Status post vaginal delivery day 1. Stable Continue current care. Plan for discharge tomorrow    Samantha Alexander 03/09/2018, 3:47 PM

## 2018-03-09 NOTE — Progress Notes (Signed)
S:  Pt states feeling better.  Feeling tired, has not had much sleep.  Infant irritable and nursing frequently.  O:  Vitals:   03/09/18 1451 03/09/18 1544  BP: 121/75 117/80  Pulse: 81 92  Resp: 16 18  Temp: 97.9 F (36.6 C)   SpO2: 100%   Hgb 8.3 A:  PPD #1 SVD wnl P:  Encouraged pt to rest Will start on iron for hgb.of 8.3

## 2018-03-09 NOTE — Progress Notes (Signed)
CNM assessing patient at the time of this RN's check. CNM not sure underlying reason for patient's vague symptoms - this RN will continue to assess and notify CNM/MD if any worsening or new symptoms arise.

## 2018-03-09 NOTE — Lactation Note (Signed)
This note was copied from a baby's chart. Lactation Consultation Note  Patient Name: Samantha Alexander ZOXWR'UToday's Date: 03/09/2018 Reason for consult: Initial assessment;Other (Comment);Primapara;1st time breastfeeding;Term(DAT (+))  5129 hours old FT female who is being partially BF and formula fed by her mother, she's a P1. [facility] and she's already familiar with hand expression. Room full of visitors, LC asked them to step out per mom's request. When LC reviewed hand expression with mom, she was able to express two big drops of colostrum out of her left breast (none on the right one). LC told mom that so far she has been able to latch baby to the left one, but not to the right one.  LC took baby STS to the left breast (per mom's request) and she was able to latch after a few tries, required lots of suck training though. Baby would intermittently suck and then chomp on a gloved finger. Once she got into a nice rhythmical sucking pattern, LC transitioned baby to the breast. A few audible swallows were heard only upon breast compressions. Mom doing a really good job at compressing breast while baby is feeding. She fed for a total of 10 minutes in football position; praised her for her efforts.  Then, after burping baby mom wanted to feed again, assisted with latch to the other breast, the right one (the challenging one). LC did all the steps above but baby would not sustain the latch like with the other breast, she kept coming off and fell asleep at the 7 minutes mark, but no audible swallows were noted on this feeding. Discussed cluster feeding and supplementation guidelines since baby has already been started on Gerber Gentle with a curved tip syringe. Mom has also been set up with a DEBP but has only pumped once today because she left discouraged. Explained to mom that the purpose of pumping and this stage is mainly for stimulation at the breast and not to  get volume. She voiced understanding; but mom aware that she should be feeding baby any potential drops she may get through pumping or hand expression due to baby's DAT (+) status.  Feeding plan  1. Encouraged mom to feed baby STS 8-12 times in 24 hours or sooner if feeding cues are present 2. Mom will start pumping every 2-3 hours during the day and at least once at night. A minimum of 8 pumping sessions in 24 hours 3. Mom will feed spoon/syringe feed baby any amount of EBM she may get and will use Gerber formula only to complete volumes required for supplementation according the baby's age in hours.  BF brochure, BF resources and feeding diary were reviewed. Mom reported all questions and concerns were answered, she's aware of LC services and will call PRN.  Maternal Data Formula Feeding for Exclusion: No Has patient been taught Hand Expression?: Yes Does the patient have breastfeeding experience prior to this delivery?: No  Feeding Feeding Type: Breast Fed  LATCH Score Latch: Repeated attempts needed to sustain latch, nipple held in mouth throughout feeding, stimulation needed to elicit sucking reflex.(right, the "difficult one")  Audible Swallowing: None  Type of Nipple: Everted at rest and after stimulation  Comfort (Breast/Nipple): Soft / non-tender  Hold (Positioning): Assistance needed to correctly position infant at breast and maintain latch.  LATCH Score: 6  Interventions Interventions: Breast feeding basics reviewed;Assisted with latch;Skin to skin;Breast massage;Hand express;Breast compression;Support pillows;Adjust position  Lactation Tools  Discussed/Used WIC Program: Yes Pump Review: Setup, frequency, and cleaning Initiated by:: RN Kristine Linea Date initiated:: 03/09/18   Consult Status Consult Status: Follow-up Date: 03/10/18 Follow-up type: In-patient    Leiana Rund Venetia Constable 03/09/2018, 4:38 PM

## 2018-03-10 DIAGNOSIS — O139 Gestational [pregnancy-induced] hypertension without significant proteinuria, unspecified trimester: Secondary | ICD-10-CM | POA: Diagnosis not present

## 2018-03-10 DIAGNOSIS — O9081 Anemia of the puerperium: Secondary | ICD-10-CM | POA: Diagnosis present

## 2018-03-10 MED ORDER — IBUPROFEN 600 MG PO TABS: 600.0000 mg | ORAL_TABLET | Freq: Four times a day (QID) | ORAL | 0 refills | Status: DC

## 2018-03-10 NOTE — Discharge Summary (Signed)
SVD OB Discharge Summary     Patient Name: Samantha Alexander DOB: 08-Sep-1990 MRN: [id number]  Date of admission: 03/07/2018 Delivering MD: Janeece RiggersGREER, ELLIS K  Date of delivery: 03/08/2018 Type of delivery: SVD  Newborn Data: Sex: Baby female Circumcision: N/A Live born female  Birth Weight: 6 lb 11.8 oz (3056 g) APGAR: 9, 9  Newborn Delivery   Birth date/time:  03/08/2018 11:22:00 Delivery type:  Vaginal, Spontaneous     Feeding: breast and bottle Infant being discharge to home with mother in stable condition.   Admitting diagnosis: 40wks sinus infection pain Intrauterine pregnancy: 2373w3d     Secondary diagnosis:  Active Problems:   Blood pressure elevated without history of HTN   Vaginal delivery   Gestational hypertension   Normal postpartum course   Postpartum anemia                                Complications: None                                                              Intrapartum Procedures: spontaneous vaginal delivery and GBS prophylaxis Postpartum Procedures: none Complications-Operative and Postpartum: 2nd  degree perineal laceration Augmentation: Pitocin and Cytotec   History of Present Illness: Ms. Samantha Samantha Alexander is a 28 y.o. female, G1P1001, who presents at 7173w3d weeks gestation. [facility]  Her pregnancy has been complicated by: Patient Active Problem List   Diagnosis Date Noted  . Gestational hypertension 03/10/2018  . Normal postpartum course 03/10/2018  . Postpartum anemia 03/10/2018  . Vaginal delivery 03/09/2018  . Blood pressure elevated without history of HTN 03/07/2018  . Candida vaginitis 07/11/2017   Hospital course:  Induction of Labor With Vaginal Delivery   28 y.o. yo G1P1001 at 9973w3d was admitted to the hospital 03/07/2018 for induction of labor.  Indication for induction: Gestational hypertension.  Patient had an uncomplicated labor course as follows: Membrane  Rupture Time/Date: 5:06 AM ,03/08/2018   Intrapartum Procedures: Episiotomy: None [1]                                         Lacerations:  2nd degree [3];Perineal [11]  Patient had delivery of a Viable infant.  Information for the patient's newborn:  Samantha Alexander, Girl Coila [[id number]][[id number]]  Delivery Method: Vaginal, Spontaneous(Filed from Delivery Summary)   03/08/2018  Details of delivery can be found in separate delivery note.  Patient had a routine postpartum course. Patient is discharged home 03/10/18. Postpartum Day # 3 : S/P NSVD due to IOL for GHTN with neg baseline preeclampsia labs. Pt denies HA, vision changes or RUQ pain, pt stable with bp today of 141/84. Patient up ad lib, denies syncope or dizziness. Reports consuming regular diet without issues and denies N/V. Patient reports 0 bowel movement + passing flatus.  Denies issues with urination and reports bleeding is "lighter."  Patient is breast and bottlefeeding and reports going well.  Desires undecided for postpartum contraception.  Pain is being appropriately managed with use of po pain meds. Post delivery hgb was 8.3, pt tolerates  iron and asymptomatic.  Physical exam  Vitals:   03/09/18 1451 03/09/18 1544 03/09/18 2210 03/10/18 0532  BP: 121/75 117/80 (!) 141/76 (!) 141/84  Pulse: 81 92 93 95  Resp: 16 18 20 18   Temp: 97.9 F (36.6 C)  98.2 F (36.8 C) 98.3 F (36.8 C)  TempSrc: Oral  Oral Oral  SpO2: 100%  99% 99%  Weight:      Height:       General: alert, cooperative and no distress Lochia: appropriate Uterine Fundus: soft Perineum: intact DVT Evaluation: No evidence of DVT seen on physical exam. Negative Homan's sign. No cords or calf tenderness. No significant calf/ankle edema.  Labs: Lab Results  Component Value Date   WBC 15.9 (H) 03/09/2018   HGB 8.3 (L) 03/09/2018   HCT 27.1 (L) 03/09/2018   MCV 73.2 (L) 03/09/2018   PLT 269 03/09/2018   CMP Latest Ref Rng & Units 03/09/2018  Glucose 70 - 99 mg/dL 119(J)   BUN 6 - 20 mg/dL 11  Creatinine 4.78 - 2.95 mg/dL 6.21  Sodium 308 - 657 mmol/L 134(L)  Potassium 3.5 - 5.1 mmol/L 3.9  Chloride 98 - 111 mmol/L 103  CO2 22 - 32 mmol/L 24  Calcium 8.9 - 10.3 mg/dL 8.4(O)  Total Protein 6.5 - 8.1 g/dL 6.2(L)  Total Bilirubin 0.3 - 1.2 mg/dL 0.5  Alkaline Phos 38 - 126 U/L 150(H)  AST 15 - 41 U/L 43(H)  ALT 0 - 44 U/L 59(H)    Date of discharge: 03/10/2018 Discharge Diagnoses: Term Pregnancy-delivered Discharge instruction: per After Visit Summary and "Baby and Me Booklet".  After visit meds:  Allergies as of 03/10/2018      Reactions   Fentanyl Itching      Medication List    TAKE these medications   ferrous sulfate 325 (65 FE) MG tablet Commonly known as:  FERROUSUL Take 1 tablet (325 mg total) by mouth 2 (two) times daily.   ibuprofen 600 MG tablet Commonly known as:  ADVIL,MOTRIN Take 1 tablet (600 mg total) by mouth every 6 (six) hours.   terconazole 0.4 % vaginal cream Commonly known as:  TERAZOL 7 Place 1 applicator vaginally at bedtime.       Activity:           unrestricted and pelvic rest Advance as tolerated. Pelvic rest for 6 weeks.  Diet:                routine Medications: PNV and Ibuprofen Postpartum contraception: Undecided Condition:  Pt discharge to home with baby in stable Anemia: IROn TID GHTN: monitor BP, if >150/90 report, report s/sx explained, f/u at [facility] in one week for bp check.   Meds: Allergies as of 03/10/2018      Reactions   Fentanyl Itching      Medication List    TAKE these medications   ferrous sulfate 325 (65 FE) MG tablet Commonly known as:  FERROUSUL Take 1 tablet (325 mg total) by mouth 2 (two) times daily.   ibuprofen 600 MG tablet Commonly known as:  ADVIL,MOTRIN Take 1 tablet (600 mg total) by mouth every 6 (six) hours.   terconazole 0.4 % vaginal cream Commonly known as:  TERAZOL 7 Place 1 applicator vaginally at bedtime.       Discharge Follow Up:  Follow-up Information     [facility] Follow up.   Specialty:  Obstetrics and Gynecology Why:  1 weeks f/u appointment for BP please call  [facility] to set this up as wel las a 6 weeks PPV Contact information: [address] [place] [address] [phone]           [place], NP-C, CNM 03/10/2018, 2:00 PM  [place], [place]

## 2018-03-10 NOTE — Lactation Note (Signed)
This note was copied from a baby's chart. Lactation Consultation Note  Patient Name: Samantha Alexander EHMCN'O Date: 03/10/2018 Reason for consult: Follow-up assessment;Infant weight loss  Baby is 46 hours old and at a 9% weight loss. Mom states that breastfeeding is not going well.  Baby is slipping down to nipple and mom c/o soreness.  I offered assist but mom declined and prefers to give formula for this feeding.  Pump is set up but mom is not pumping consistently.  Stressed importance of pumping every 2-3 hours to establish milk supply.  Recommended outpatient appointment.  Instructed to call if she desired a feeding assist later today.  Maternal Data    Feeding Feeding Type: Formula  LATCH Score                   Interventions    Lactation Tools Discussed/Used     Consult Status Consult Status: Complete Follow-up type: Call as needed    Huston Foley 03/10/2018, 10:17 AM

## 2018-03-12 ENCOUNTER — Inpatient Hospital Stay (HOSPITAL_COMMUNITY): Admission: RE | Admit: 2018-03-12 | Payer: Medicaid Other | Source: Ambulatory Visit

## 2018-03-13 ENCOUNTER — Inpatient Hospital Stay (HOSPITAL_COMMUNITY)
Admission: AD | Admit: 2018-03-13 | Discharge: 2018-03-16 | DRG: 776 | Disposition: A | Discharge status: Home / Self Care | Payer: Medicaid Other | Attending: Obstetrics & Gynecology | Admitting: Obstetrics & Gynecology

## 2018-03-13 ENCOUNTER — Encounter (HOSPITAL_COMMUNITY): Payer: Self-pay | Admitting: *Deleted

## 2018-03-13 ENCOUNTER — Other Ambulatory Visit: Payer: Self-pay

## 2018-03-13 DIAGNOSIS — D649 Anemia, unspecified: Secondary | ICD-10-CM | POA: Diagnosis present

## 2018-03-13 DIAGNOSIS — O1415 Severe pre-eclampsia, complicating the puerperium: Secondary | ICD-10-CM | POA: Diagnosis present

## 2018-03-13 DIAGNOSIS — O9081 Anemia of the puerperium: Secondary | ICD-10-CM | POA: Diagnosis present

## 2018-03-13 LAB — CBC WITH DIFFERENTIAL/PLATELET
Basophils Absolute: 0 10*3/uL (ref 0.0–0.1)
Basophils Relative: 0 %
Eosinophils Absolute: 0.1 10*3/uL (ref 0.0–0.5)
Eosinophils Relative: 1 %
HCT: 29.4 % — ABNORMAL LOW (ref 36.0–46.0)
Hemoglobin: 8.7 g/dL — ABNORMAL LOW (ref 12.0–15.0)
LYMPHS ABS: 2.3 10*3/uL (ref 0.7–4.0)
Lymphocytes Relative: 22 %
MCH: 22 pg — AB (ref 26.0–34.0)
MCHC: 29.6 g/dL — ABNORMAL LOW (ref 30.0–36.0)
MCV: 74.4 fL — AB (ref 80.0–100.0)
MONOS PCT: 3 %
Monocytes Absolute: 0.3 10*3/uL (ref 0.1–1.0)
Neutro Abs: 7.9 10*3/uL — ABNORMAL HIGH (ref 1.7–7.7)
Neutrophils Relative %: 74 %
Platelets: 357 10*3/uL (ref 150–400)
RBC: 3.95 MIL/uL (ref 3.87–5.11)
RDW: 18.9 % — ABNORMAL HIGH (ref 11.5–15.5)
WBC: 10.7 10*3/uL — ABNORMAL HIGH (ref 4.0–10.5)
nRBC: 0 % (ref 0.0–0.2)

## 2018-03-13 LAB — COMPREHENSIVE METABOLIC PANEL
ALT: 63 U/L — ABNORMAL HIGH (ref 0–44)
AST: 71 U/L — ABNORMAL HIGH (ref 15–41)
Albumin: 3.1 g/dL — ABNORMAL LOW (ref 3.5–5.0)
Alkaline Phosphatase: 124 U/L (ref 38–126)
Anion gap: 9 (ref 5–15)
BILIRUBIN TOTAL: 0.4 mg/dL (ref 0.3–1.2)
BUN: 17 mg/dL (ref 6–20)
CO2: 22 mmol/L (ref 22–32)
Calcium: 8.7 mg/dL — ABNORMAL LOW (ref 8.9–10.3)
Chloride: 107 mmol/L (ref 98–111)
Creatinine, Ser: 1.03 mg/dL — ABNORMAL HIGH (ref 0.44–1.00)
GFR calc Af Amer: >60 mL/min (ref >60)
GFR calc non Af Amer: >60 mL/min (ref >60)
Glucose, Bld: 91 mg/dL (ref 70–99)
Potassium: 3.8 mmol/L (ref 3.5–5.1)
Sodium: 138 mmol/L (ref 135–145)
TOTAL PROTEIN: 7 g/dL (ref 6.5–8.1)

## 2018-03-13 LAB — PROTEIN / CREATININE RATIO, URINE
Creatinine, Urine: 209 mg/dL
Protein Creatinine Ratio: 0.1 mg/mg{Cre} (ref 0.00–0.15)
Total Protein, Urine: 21 mg/dL

## 2018-03-13 LAB — URIC ACID: Uric Acid, Serum: 7.9 mg/dL — ABNORMAL HIGH (ref 2.5–7.1)

## 2018-03-13 LAB — LACTATE DEHYDROGENASE: LDH: 185 U/L (ref 98–192)

## 2018-03-13 MED ORDER — MAGNESIUM SULFATE BOLUS VIA INFUSION
4.0000 g | Freq: Once | INTRAVENOUS | Status: AC
  Administered 2018-03-13: 4 g via INTRAVENOUS
  Filled 2018-03-13: qty 500

## 2018-03-13 MED ORDER — LABETALOL HCL 5 MG/ML IV SOLN: 20.0000 mg | INTRAVENOUS | Status: DC | PRN

## 2018-03-13 MED ORDER — LABETALOL HCL 5 MG/ML IV SOLN: 40.0000 mg | INTRAVENOUS | Status: DC | PRN

## 2018-03-13 MED ORDER — MAGNESIUM SULFATE 40 G IN LACTATED RINGERS - SIMPLE
1.0000 g/h | INTRAVENOUS | Status: DC
  Administered 2018-03-13: 2 g/h via INTRAVENOUS
  Filled 2018-03-13 (×2): qty 500

## 2018-03-13 MED ORDER — HYDRALAZINE HCL 20 MG/ML IJ SOLN: 10.0000 mg | INTRAMUSCULAR | Status: DC | PRN

## 2018-03-13 MED ORDER — ACETAMINOPHEN 325 MG PO TABS
650.0000 mg | ORAL_TABLET | Freq: Four times a day (QID) | ORAL | Status: DC | PRN
  Administered 2018-03-13 – 2018-03-14 (×3): 650 mg via ORAL
  Filled 2018-03-13 (×3): qty 2

## 2018-03-13 MED ORDER — LACTATED RINGERS IV SOLN
INTRAVENOUS | Status: DC
  Administered 2018-03-13 – 2018-03-15 (×4): via INTRAVENOUS

## 2018-03-13 MED ORDER — LABETALOL HCL 5 MG/ML IV SOLN: 80.0000 mg | INTRAVENOUS | Status: DC | PRN

## 2018-03-13 NOTE — MAU Note (Signed)
Pt had vaginal delivery on 01/04, reports increased swelling in legs L>R, tingling and increased swelling in her left arm. Denies blurred vision, headaches off/on

## 2018-03-13 NOTE — H&P (Signed)
Samantha Alexander is a 28 y.o. female presenting to MAU for bilateral upper and lower extremity edema.   Patient is 5 days postpartum and was discharged on the 6th. She was induced for gestational hypertension and her preeclampsia labs were negative on that admission. She reports intermittent headaches, no blurred vision or spots in her vision, and denies RUQ pain. She reports some symptoms of anemia: dizziness and edges of her vision go dark when she stands up. Patient states that the swelling in her upper and lower extremities was worse yesterday and was causing mild intermittent pain and tingling in her left arm and pain in her left leg. She denies these symptoms today.   OB History    Gravida  1   Para  1   Term  1   Preterm      AB      Living  1     SAB      TAB      Ectopic      Multiple  0   Live Births  1          Past Medical History:  Diagnosis Date  . Abscess of vulva   . Anemia   . Headache   . HPV (human papilloma virus) anogenital infection   . Infection    recurrent yeast infections throughout pregnancy  . Medical history non-contributory    Past Surgical History:  Procedure Laterality Date  . hemorrhoid ligation    . WISDOM TOOTH EXTRACTION     Family History: family history is not on file. Social History:  reports that she has never smoked. She has never used smokeless tobacco. She reports that she does not drink alcohol or use drugs.  Review of Systems  Eyes: Negative for blurred vision and double vision.  Gastrointestinal: Negative for abdominal pain.  Neurological: Negative for headaches.  All other systems reviewed and are negative.  Vitals:   03/13/18 1631 03/13/18 1657 03/13/18 1701 03/13/18 1716  BP: 139/88 128/83 129/79 133/88  Pulse: 94 90 87 84  Resp:      Temp:      TempSrc:      SpO2:      Weight:      Height:       Results for orders placed or performed during the hospital encounter of 03/13/18 (from the past 24 hour(s))   CBC with Differential/Platelet     Status: Abnormal   Collection Time: 03/13/18  3:57 PM  Result Value Ref Range   WBC 10.7 (H) 4.0 - 10.5 K/uL   RBC 3.95 3.87 - 5.11 MIL/uL   Hemoglobin 8.7 (L) 12.0 - 15.0 g/dL   HCT 73.4 (L) 19.3 - 79.0 %   MCV 74.4 (L) 80.0 - 100.0 fL   MCH 22.0 (L) 26.0 - 34.0 pg   MCHC 29.6 (L) 30.0 - 36.0 g/dL   RDW 24.0 (H) 97.3 - 53.2 %   Platelets 357 150 - 400 K/uL   nRBC 0.0 0.0 - 0.2 %   Neutrophils Relative % 74 %   Neutro Abs 7.9 (H) 1.7 - 7.7 K/uL   Lymphocytes Relative 22 %   Lymphs Abs 2.3 0.7 - 4.0 K/uL   Monocytes Relative 3 %   Monocytes Absolute 0.3 0.1 - 1.0 K/uL   Eosinophils Relative 1 %   Eosinophils Absolute 0.1 0.0 - 0.5 K/uL   Basophils Relative 0 %   Basophils Absolute 0.0 0.0 - 0.1 K/uL  Comprehensive metabolic panel  Status: Abnormal   Collection Time: 03/13/18  3:57 PM  Result Value Ref Range   Sodium 138 135 - 145 mmol/L   Potassium 3.8 3.5 - 5.1 mmol/L   Chloride 107 98 - 111 mmol/L   CO2 22 22 - 32 mmol/L   Glucose, Bld 91 70 - 99 mg/dL   BUN 17 6 - 20 mg/dL   Creatinine, Ser 3.60 (H) 0.44 - 1.00 mg/dL   Calcium 8.7 (L) 8.9 - 10.3 mg/dL   Total Protein 7.0 6.5 - 8.1 g/dL   Albumin 3.1 (L) 3.5 - 5.0 g/dL   AST 71 (H) 15 - 41 U/L   ALT 63 (H) 0 - 44 U/L   Alkaline Phosphatase 124 38 - 126 U/L   Total Bilirubin 0.4 0.3 - 1.2 mg/dL   GFR calc non Af Amer >60 >60 mL/min   GFR calc Af Amer >60 >60 mL/min   Anion gap 9 5 - 15  Uric acid     Status: Abnormal   Collection Time: 03/13/18  3:57 PM  Result Value Ref Range   Uric Acid, Serum 7.9 (H) 2.5 - 7.1 mg/dL  Lactate dehydrogenase     Status: None   Collection Time: 03/13/18  3:57 PM  Result Value Ref Range   LDH 185 98 - 192 U/L  Protein / creatinine ratio, urine     Status: None   Collection Time: 03/13/18  4:46 PM  Result Value Ref Range   Creatinine, Urine 209.00 mg/dL   Total Protein, Urine 21 mg/dL   Protein Creatinine Ratio 0.10 0.00 - 0.15 mg/mg[Cre]    Physical Exam  Nursing note and vitals reviewed. Constitutional: She is oriented to person, place, and time. She appears well-developed and well-nourished.  HENT:  Head: Normocephalic and atraumatic.  Eyes: Pupils are equal, round, and reactive to light.  Cardiovascular: Normal rate, regular rhythm and normal heart sounds.  Respiratory: Effort normal and breath sounds normal. No respiratory distress.  GI: Soft. There is no abdominal tenderness.  Musculoskeletal: Normal range of motion.        General: Edema present.     Comments: 2+ nonpitting edema to bilateral upper and lower extremities   Neurological: She is alert and oriented to person, place, and time.  Skin: Skin is warm and dry.  Psychiatric: She has a normal mood and affect. Her behavior is normal. Judgment and thought content normal.    Assessment/Plan: 28 y.o. G1P1 at 5 days postpartum Per Dr. Sallye Ober, preeclampsia with severe features Preeclampsia labs showed elevated AST/ALT (71, 63) and creatinine (1.03) Consulted Dr. Sallye Ober Admit patient to antepartum floor Start MgSO4 for severe preeclampsia Offered 2 units PRBCs for symptomatic anemia, patient does not desire second needle stick so will order after MgSO4 stopped Repeat CBC, CMP in morning Labetalol protocol for severe range blood pressures Anticipate starting Procardia XL 30mg  QD if needed for hypertension after MgSO4 stopped   [name] 03/13/2018, 6:15 PM

## 2018-03-13 NOTE — MAU Note (Signed)
Pt did not want to have a catheterized   specimen due to vaginal soreness. Will notify provider.

## 2018-03-14 LAB — CBC WITH DIFFERENTIAL/PLATELET
Basophils Absolute: 0 10*3/uL (ref 0.0–0.1)
Basophils Relative: 0 %
Eosinophils Absolute: 0.1 10*3/uL (ref 0.0–0.5)
Eosinophils Relative: 1 %
HCT: 26.8 % — ABNORMAL LOW (ref 36.0–46.0)
HEMOGLOBIN: 8.2 g/dL — AB (ref 12.0–15.0)
Lymphocytes Relative: 15 %
Lymphs Abs: 1.4 10*3/uL (ref 0.7–4.0)
MCH: 22.6 pg — ABNORMAL LOW (ref 26.0–34.0)
MCHC: 30.6 g/dL (ref 30.0–36.0)
MCV: 73.8 fL — ABNORMAL LOW (ref 80.0–100.0)
Monocytes Absolute: 0.3 10*3/uL (ref 0.1–1.0)
Monocytes Relative: 3 %
NRBC: 0 % (ref 0.0–0.2)
Neutro Abs: 7.5 10*3/uL (ref 1.7–7.7)
Neutrophils Relative %: 81 %
Platelets: 314 10*3/uL (ref 150–400)
RBC: 3.63 MIL/uL — ABNORMAL LOW (ref 3.87–5.11)
RDW: 18.8 % — ABNORMAL HIGH (ref 11.5–15.5)
WBC: 9.3 10*3/uL (ref 4.0–10.5)

## 2018-03-14 LAB — COMPREHENSIVE METABOLIC PANEL
ALT: 53 U/L — ABNORMAL HIGH (ref 0–44)
AST: 56 U/L — ABNORMAL HIGH (ref 15–41)
Albumin: 2.7 g/dL — ABNORMAL LOW (ref 3.5–5.0)
Alkaline Phosphatase: 109 U/L (ref 38–126)
Anion gap: 8 (ref 5–15)
BUN: 15 mg/dL (ref 6–20)
CO2: 22 mmol/L (ref 22–32)
Calcium: 7.8 mg/dL — ABNORMAL LOW (ref 8.9–10.3)
Chloride: 108 mmol/L (ref 98–111)
Creatinine, Ser: 0.81 mg/dL (ref 0.44–1.00)
GFR calc non Af Amer: >60 mL/min (ref >60)
Glucose, Bld: 97 mg/dL (ref 70–99)
Potassium: 3.8 mmol/L (ref 3.5–5.1)
Sodium: 138 mmol/L (ref 135–145)
Total Bilirubin: 0.6 mg/dL (ref 0.3–1.2)
Total Protein: 6.2 g/dL — ABNORMAL LOW (ref 6.5–8.1)

## 2018-03-14 LAB — MAGNESIUM: Magnesium: 4.5 mg/dL — ABNORMAL HIGH (ref 1.7–2.4)

## 2018-03-14 MED ORDER — FERROUS SULFATE 325 (65 FE) MG PO TABS
325.0000 mg | ORAL_TABLET | Freq: Two times a day (BID) | ORAL | Status: DC
  Administered 2018-03-15 – 2018-03-16 (×3): 325 mg via ORAL
  Filled 2018-03-14 (×3): qty 1

## 2018-03-14 MED ORDER — BUTALBITAL-APAP-CAFFEINE 50-325-40 MG PO TABS
1.0000 | ORAL_TABLET | Freq: Four times a day (QID) | ORAL | Status: DC | PRN
  Administered 2018-03-14 – 2018-03-16 (×3): 1 via ORAL
  Filled 2018-03-14 (×3): qty 1

## 2018-03-14 NOTE — Progress Notes (Signed)
Patient ID: Samantha Alexander, female   DOB: 10-01-1990, 28 y.o.   MRN: [id number]   Pt c/o chest pain.   BP (!) 131/91   Pulse 77   Temp 98.9 F (37.2 C) (Oral)   Resp 18   Ht 5' 7.5" (1.715 m)   Wt 106.1 kg   SpO2 100%   BMI 36.11 kg/m  CV RRR Lungs CTA B Abd soft NT EXt 1 plus DTR Urine output ENL Pt in NAD Check magnesium level and decrease mag to 1gram /hr

## 2018-03-14 NOTE — Progress Notes (Signed)
Post Partum Day 6  Subjective: Patient admitted yesterday with diagnosis of preeclampsia with severe features postpartum. Today patient reports headache unrelieved by tylenol but denies other symptoms of preeclampsia. She reports swelling is decreased from yesterday. Per Dr. Sallye Ober, patient offered 2 units PRBCs yesterday for mild symptomatic anemia. Patient desires after her MgSO4 finishes and so will receive blood tonight. Patient's mood appears stable today but yesterday during admission she appeared to have labile mood consistent with the "baby blues". However family member was providing majority of infant care and patient may be overwhelmed. Per Dr. Normand Sloop, social work consult ordered to assess patient's coping and to provide any necessary referrals. Patient likely a candidate for closer follow-up postpartum for increased risk for postpartum depression.   Objective: Vitals:   03/14/18 0305 03/14/18 0355 03/14/18 0400 03/14/18 0824  BP: 120/83  124/75 (!) 132/101  Pulse: 89  87 81  Resp: 20 18 18 20   Temp:   98.2 F (36.8 C) 97.9 F (36.6 C)  TempSrc:   Oral Oral  SpO2:   99% 100%  Weight:      Height:       Physical Exam:  General: alert and cooperative  1+ non-pitting edema noted to extremities bilaterally. Lochia: appropriate Uterine Fundus: firm Incision: n/a DVT Evaluation: No evidence of DVT seen on physical exam. Negative Homan's sign. No cords or calf tenderness. No significant calf/ankle edema.  Results for orders placed or performed during the hospital encounter of 03/13/18 (from the past 24 hour(s))  CBC with Differential/Platelet     Status: Abnormal   Collection Time: 03/13/18  3:57 PM  Result Value Ref Range   WBC 10.7 (H) 4.0 - 10.5 K/uL   RBC 3.95 3.87 - 5.11 MIL/uL   Hemoglobin 8.7 (L) 12.0 - 15.0 g/dL   HCT 95.6 (L) 21.3 - 08.6 %   MCV 74.4 (L) 80.0 - 100.0 fL   MCH 22.0 (L) 26.0 - 34.0 pg   MCHC 29.6 (L) 30.0 - 36.0 g/dL   RDW 57.8 (H) 46.9 - 62.9 %   Platelets 357 150 - 400 K/uL   nRBC 0.0 0.0 - 0.2 %   Neutrophils Relative % 74 %   Neutro Abs 7.9 (H) 1.7 - 7.7 K/uL   Lymphocytes Relative 22 %   Lymphs Abs 2.3 0.7 - 4.0 K/uL   Monocytes Relative 3 %   Monocytes Absolute 0.3 0.1 - 1.0 K/uL   Eosinophils Relative 1 %   Eosinophils Absolute 0.1 0.0 - 0.5 K/uL   Basophils Relative 0 %   Basophils Absolute 0.0 0.0 - 0.1 K/uL  Comprehensive metabolic panel     Status: Abnormal   Collection Time: 03/13/18  3:57 PM  Result Value Ref Range   Sodium 138 135 - 145 mmol/L   Potassium 3.8 3.5 - 5.1 mmol/L   Chloride 107 98 - 111 mmol/L   CO2 22 22 - 32 mmol/L   Glucose, Bld 91 70 - 99 mg/dL   BUN 17 6 - 20 mg/dL   Creatinine, Ser 5.28 (H) 0.44 - 1.00 mg/dL   Calcium 8.7 (L) 8.9 - 10.3 mg/dL   Total Protein 7.0 6.5 - 8.1 g/dL   Albumin 3.1 (L) 3.5 - 5.0 g/dL   AST 71 (H) 15 - 41 U/L   ALT 63 (H) 0 - 44 U/L   Alkaline Phosphatase 124 38 - 126 U/L   Total Bilirubin 0.4 0.3 - 1.2 mg/dL   GFR calc non Af Amer >60 >  60 mL/min   GFR calc Af Amer >60 >60 mL/min   Anion gap 9 5 - 15  Uric acid     Status: Abnormal   Collection Time: 03/13/18  3:57 PM  Result Value Ref Range   Uric Acid, Serum 7.9 (H) 2.5 - 7.1 mg/dL  Lactate dehydrogenase     Status: None   Collection Time: 03/13/18  3:57 PM  Result Value Ref Range   LDH 185 98 - 192 U/L  Protein / creatinine ratio, urine     Status: None   Collection Time: 03/13/18  4:46 PM  Result Value Ref Range   Creatinine, Urine 209.00 mg/dL   Total Protein, Urine 21 mg/dL   Protein Creatinine Ratio 0.10 0.00 - 0.15 mg/mg[Cre]  Comprehensive metabolic panel     Status: Abnormal   Collection Time: 03/14/18  5:28 AM  Result Value Ref Range   Sodium 138 135 - 145 mmol/L   Potassium 3.8 3.5 - 5.1 mmol/L   Chloride 108 98 - 111 mmol/L   CO2 22 22 - 32 mmol/L   Glucose, Bld 97 70 - 99 mg/dL   BUN 15 6 - 20 mg/dL   Creatinine, Ser 8.110.81 0.44 - 1.00 mg/dL   Calcium 7.8 (L) 8.9 - 10.3 mg/dL   Total  Protein 6.2 (L) 6.5 - 8.1 g/dL   Albumin 2.7 (L) 3.5 - 5.0 g/dL   AST 56 (H) 15 - 41 U/L   ALT 53 (H) 0 - 44 U/L   Alkaline Phosphatase 109 38 - 126 U/L   Total Bilirubin 0.6 0.3 - 1.2 mg/dL   GFR calc non Af Amer >60 >60 mL/min   GFR calc Af Amer >60 >60 mL/min   Anion gap 8 5 - 15  CBC with Differential     Status: Abnormal   Collection Time: 03/14/18  5:28 AM  Result Value Ref Range   WBC 9.3 4.0 - 10.5 K/uL   RBC 3.63 (L) 3.87 - 5.11 MIL/uL   Hemoglobin 8.2 (L) 12.0 - 15.0 g/dL   HCT 91.426.8 (L) 78.236.0 - 95.646.0 %   MCV 73.8 (L) 80.0 - 100.0 fL   MCH 22.6 (L) 26.0 - 34.0 pg   MCHC 30.6 30.0 - 36.0 g/dL   RDW 21.318.8 (H) 08.611.5 - 57.815.5 %   Platelets 314 150 - 400 K/uL   nRBC 0.0 0.0 - 0.2 %   Neutrophils Relative % 81 %   Neutro Abs 7.5 1.7 - 7.7 K/uL   Lymphocytes Relative 15 %   Lymphs Abs 1.4 0.7 - 4.0 K/uL   Monocytes Relative 3 %   Monocytes Absolute 0.3 0.1 - 1.0 K/uL   Eosinophils Relative 1 %   Eosinophils Absolute 0.1 0.0 - 0.5 K/uL   Basophils Relative 0 %   Basophils Absolute 0.0 0.0 - 0.1 K/uL   RBC Morphology OVALOCYTES    Assessment/Plan: 28 y.o. G1P1 6 days postpartum Preeclampsia with severe features per Dr. Sallye OberKulwa MgSO4 for 24 hours for seizure prophylaxis  Social work consult 2u PRBCs tonight  Continue to monitor blood pressure and if stable discharge patient tomorrow    LOS: 1 day   Samantha Alexander 03/14/2018, 9:58 AM

## 2018-03-15 MED ORDER — LABETALOL HCL 100 MG PO TABS: 100.0000 mg | ORAL_TABLET | Freq: Two times a day (BID) | ORAL | 1 refills | Status: DC

## 2018-03-15 MED ORDER — NIFEDIPINE ER OSMOTIC RELEASE 30 MG PO TB24: 30.0000 mg | ORAL_TABLET | Freq: Once | ORAL | Status: DC

## 2018-03-15 MED ORDER — NIFEDIPINE ER OSMOTIC RELEASE 30 MG PO TB24
60.0000 mg | ORAL_TABLET | Freq: Every day | ORAL | Status: DC
  Administered 2018-03-16: 60 mg via ORAL
  Filled 2018-03-15: qty 2

## 2018-03-15 MED ORDER — BUTALBITAL-APAP-CAFFEINE 50-325-40 MG PO TABS: 1.0000 | ORAL_TABLET | Freq: Four times a day (QID) | ORAL | 0 refills | Status: DC | PRN

## 2018-03-15 MED ORDER — LABETALOL HCL 100 MG PO TABS
100.0000 mg | ORAL_TABLET | Freq: Two times a day (BID) | ORAL | Status: DC
  Administered 2018-03-15: 100 mg via ORAL
  Filled 2018-03-15: qty 1

## 2018-03-15 MED ORDER — NIFEDIPINE ER OSMOTIC RELEASE 30 MG PO TB24
60.0000 mg | ORAL_TABLET | Freq: Every day | ORAL | Status: DC
  Administered 2018-03-15: 60 mg via ORAL
  Filled 2018-03-15: qty 2

## 2018-03-15 MED ORDER — NIFEDIPINE ER OSMOTIC RELEASE 30 MG PO TB24: 90.0000 mg | ORAL_TABLET | Freq: Every day | ORAL | Status: DC

## 2018-03-15 NOTE — Lactation Note (Signed)
Lactation Consultation Note  Patient Name: Samantha Alexander HWTUU'E Date: 03/15/2018  Mom readmitted 5 days postpartum for treatment of preeclampsia.  Baby has not been latching at home and mom has been using a manual pump.  She has obtained drops of colostrum.  RN has set mom up with symphony pump and she just pumped for the first time but no milk obtained.  Instructed on pumping 8-12 times/24 hours.  Discussed [employer] loaner pumps for discharge.  Encouraged to call for assist/concerns prn.    Consult Status      Huston Foley 03/15/2018, 9:52 AM

## 2018-03-15 NOTE — Discharge Summary (Addendum)
Pt is stable today on procardia and will be discharged home to f/u in office next week for BP check.  Warning s/s reviewed with patient and reviewed discharge orders and instructions from [facility] CNM.  Posptarum readmitting for postpartum preeclampsia with severe features OB Discharge Summary     Patient Name: Samantha Alexander DOB: Jun 02, 1990 MRN: [id number]  Date of admission: 03/13/2018 Delivering MD: This patient has no babies on file. Date of delivery: 03/08/2018 Type of delivery: SVD  Pt now PPD#7  Newborn Data: Sex: Baby female Circumcision: N/A Live born female  Birth Weight: 6 lb 11.8 oz (3056 g) APGAR: 9, 9  Newborn Delivery   Birth date/time:  03/08/2018 11:22:00 Delivery type:  Vaginal, Spontaneous     Feeding: breast and bottle Infant being discharge to home with mother in stable condition.   Admitting diagnosis: SWELLING IN LEGS Intrauterine pregnancy: [redacted]w[redacted]d     Secondary diagnosis:  Active Problems:   Hypertension in pregnancy, preeclampsia, severe, delivered/postpartum                               During labor: Complications: None                                                              Intrapartum Procedures: spontaneous vaginal delivery and GBS prophylaxis Postpartum Procedures: none Complications-Operative and Postpartum: 2nd  degree perineal laceration Augmentation: Pitocin and Cytotec   History of Present Illness: [name] is a 28 y.o. female, G1P1001, who presents at [redacted]w[redacted]d weeks gestation. The patient has been followed at  [facility] and Gynecology  Her pregnancy has been complicated by: Patient Active Problem List   Diagnosis Date Noted  . Hypertension in pregnancy, preeclampsia, severe, delivered/postpartum 03/13/2018  . Gestational hypertension 03/10/2018  . Normal postpartum course 03/10/2018  . Postpartum anemia 03/10/2018  . Vaginal delivery 03/09/2018  . Blood pressure elevated without history of HTN  03/07/2018  . Candida vaginitis 07/11/2017   Pt had Hospital course:  Induction of Labor With Vaginal Delivery   27 y.o. yo G1P1001 at [redacted]w[redacted]d was admitted to the hospital 03/07/2018 for induction of labor.  Indication for induction: Gestational hypertension.  Patient had an uncomplicated labor course as follows: Membrane Rupture Time/Date: This patient has no babies on file.,This patient has no babies on file.  Intrapartum Procedures: Episiotomy: This patient has no babies on file.                                        Lacerations:  This patient has no babies on file. Patient had delivery of a Viable infant.  03/08/2018  Details of delivery can be found in separate delivery note.  Patient had a routine postpartum course. Patient is discharged home 03/10/18. Postpartum Day # 2 : S/P NSVD due to IOL for GHTN with neg baseline preeclampsia labs. Pt denies HA, vision changes or RUQ pain, pt stable with bp today of 141/84. Patient up ad lib, denies syncope or dizziness. Reports consuming regular diet without issues and denies N/V. Patient reports 0 bowel movement + passing flatus.  Denies issues with urination and reports bleeding  is "lighter."  Patient is breast and bottlefeeding and reports going well.  Desires undecided for postpartum contraception.  Pain is being appropriately managed with use of po pain meds. Post delivery hgb was 8.3, pt tolerates iron and asymptomatic.  Pt was then readmitted on 03/13/2018, please see below for HPI for readmission : Samantha Alexander is a 28 y.o. female presenting to MAU for bilateral upper and lower extremity edema.   Patient is 5 days postpartum and was discharged on the 6th. She was induced for gestational hypertension and her preeclampsia labs were negative on that admission. She reports intermittent headaches, no blurred vision or spots in her vision, and denies RUQ pain. She reports some symptoms of anemia: dizziness and edges of her vision go dark when she  stands up. Patient states that the swelling in her upper and lower extremities was worse yesterday and was causing mild intermittent pain and tingling in her left arm and pain in her left leg. She denies these symptoms today.   Today s/sx of anemia has spontaneously resolved and pt on IRON, pt had neck pain that radiated to head was taking fioricet with relief, most liekly due to sleeping wrong on neck, hurts to touch, worse to turn neck, heat helped a little to relieve pain last night, pt had 24 hour of PP magnesium of which was turned off at 8pm on 03/14/2018, labs resulted continued elevated LFTs but treading down to ast 56 and alt 53 from 71/63 respectively, creatinine down to 0.81 from 1.03, platelets stable at 314 from 357, hgb 8.2 from 8.7. PCR on 01/09 was 0.10, uric acid was 7.9 from 5.2 8 days prior.  pt was stable and in NAD last night, this morning on 1/11 pt denies HA, vision changes, no RUQ pain, pt feeling better and ready for discharge. Pt never required IV tx for HTN and not currently on medications. BP now normotensive at 120/60, and BP was 143/89 @ 0742 this morning. Plan for discharge if BP remains <150/90. Plans to start labetalol 100mg  BID for Consult with Dr Normand Sloop, then BP recheck and discharge this afternoon if BP<150/90. BP ranges were as followed: Temp:  [98.3 F (36.8 C)-99.1 F (37.3 C)] 99.1 F (37.3 C) (01/11 1159) Pulse Rate:  [67-96] 69 (01/11 1237) Resp:  [18-20] 19 (01/11 1159) BP: (120-143)/(60-95) 143/89 (01/11 1237) SpO2:  [98 %-100 %] 98 % (01/11 1159)  Physical exam  Vitals:   03/15/18 0439 03/15/18 0742 03/15/18 1159 03/15/18 1237  BP: 120/60 (!) 143/89 (!) 143/95 (!) 143/89  Pulse: 92 67 78 69  Resp: 19 19 19    Temp: 98.7 F (37.1 C) 98.6 F (37 C) 99.1 F (37.3 C)   TempSrc: Oral Oral Oral   SpO2: 99% 100% 98%   Weight:      Height:       General: alert, cooperative and no distress Lochia: appropriate Uterine Fundus: firm Perineum: intact DVT  Evaluation: No evidence of DVT seen on physical exam. Negative Homan's sign. No cords or calf tenderness. Significant calf/ankle edema +2pitting. +2 Patellar DTR, no clonus.   Labs: Lab Results  Component Value Date   WBC 9.3 03/14/2018   HGB 8.2 (L) 03/14/2018   HCT 26.8 (L) 03/14/2018   MCV 73.8 (L) 03/14/2018   PLT 314 03/14/2018   CMP Latest Ref Rng & Units 03/14/2018  Glucose 70 - 99 mg/dL 97  BUN 6 - 20 mg/dL 15  Creatinine 0.10 - 9.32 mg/dL 3.55  Sodium 732 -  145 mmol/L 138  Potassium 3.5 - 5.1 mmol/L 3.8  Chloride 98 - 111 mmol/L 108  CO2 22 - 32 mmol/L 22  Calcium 8.9 - 10.3 mg/dL 7.8(L)  Total Protein 6.5 - 8.1 g/dL 6.2(L)  Total Bilirubin 0.3 - 1.2 mg/dL 0.6  Alkaline Phos 38 - 126 U/L 109  AST 15 - 41 U/L 56(H)  ALT 0 - 44 U/L 53(H)    Date of discharge: 03/15/2018 Discharge Diagnoses: Resolved Postpartum preeclampsia with severe features.  Discharge instruction: per After Visit Summary and "Baby and Me Booklet".  Activity:           unrestricted and pelvic rest Advance as tolerated. Pelvic rest for 6 weeks.  Diet:                routine Medications: PNV, Colace and Iron Postpartum contraception: Undecided Condition:  Pt discharge to home with baby in stable Anemia: IROn TID GHTN: monitor BP, if >150/90 report, report s/sx explained, f/u at [facility] in one week for bp check. Baby Love to visit home for BP check. Report s/sx of vision changes, HA, that do not resolved with firoicet or tylenol, and RUQ pain.   Meds: procardia started today 60mg  xl daily, labetalol discontinued.    Discharge Follow Up:  Follow-up Information    [facility]   Specialty:  Obstetrics and Gynecology Why:  Please call the office and make a 1 week f/u appointment for BP check and 6 weeks PPV.  Contact information: [address] [place] [id number] [phone]           [facility] Hospital[facility]ade Montana, NP-C,  CNM 03/15/2018, 1:42 PM  [facility] System[facility]ade Montana, FNP  Addendum made @ 1:43 PM  Dr Normand Sloopillard in room assessing pt, BP reported to be elevated despite being on labetalol 100mg  BID last given at 1022 today, BP post labetalol was 143/95 then @ 1237 143/89, Per Dr Normand Sloopillard labetalol discontinued and procardia 60mg  xl daily started, pt to stay in pt until tomorrow to ensure normotensive BP controlled on procardia. Discharge order discontinued.

## 2018-03-15 NOTE — Progress Notes (Signed)
CSW met wit MOB in room 315.  When CSW arrived, MOB was in bed bonding with infant as evidence by engaging in skin to skin.  MOB and infant appeared comfortable.  MOB's mother was also present and MOB gave CSW permission to complete the assessment while MOB's mother was present. MOB's appeared supportive of MOB and infant and actively participated in the assessment.  MOB was easy to engage, polite, and receptive to meeting with CSW.  CSW assessed for psychosocial stressors and MOB denied all stressors  However, MOB was open to resources for outpatient counseling.   CSW also assessed for essential items needed to care for infant.  MOB reported having all essential items and communicated feeling prepared to parent.   CSW identifies no further need for intervention and no barriers to discharge at this time.  Laurey Arrow, MSW, LCSW Clinical Social Work [phone]

## 2018-03-16 MED ORDER — BUTALBITAL-APAP-CAFFEINE 50-325-40 MG PO TABS: 1.0000 | ORAL_TABLET | Freq: Four times a day (QID) | ORAL | 0 refills | Status: DC | PRN

## 2018-03-16 MED ORDER — NIFEDIPINE ER 60 MG PO TB24: 60.0000 mg | ORAL_TABLET | Freq: Every day | ORAL | 1 refills | Status: DC

## 2018-04-26 ENCOUNTER — Inpatient Hospital Stay (HOSPITAL_COMMUNITY)
Admission: AD | Admit: 2018-04-26 | Discharge: 2018-04-26 | Disposition: A | Discharge status: Home / Self Care | Payer: Medicaid Other | Source: Ambulatory Visit | Attending: Obstetrics and Gynecology | Admitting: Obstetrics and Gynecology

## 2018-04-26 ENCOUNTER — Encounter (HOSPITAL_COMMUNITY): Payer: Self-pay

## 2018-04-26 ENCOUNTER — Other Ambulatory Visit: Payer: Self-pay

## 2018-04-26 DIAGNOSIS — O1003 Pre-existing essential hypertension complicating the puerperium: Secondary | ICD-10-CM | POA: Insufficient documentation

## 2018-04-26 DIAGNOSIS — N939 Abnormal uterine and vaginal bleeding, unspecified: Secondary | ICD-10-CM

## 2018-04-26 DIAGNOSIS — I10 Essential (primary) hypertension: Secondary | ICD-10-CM | POA: Diagnosis not present

## 2018-04-26 HISTORY — DX: Gestational (pregnancy-induced) hypertension without significant proteinuria, unspecified trimester: O13.9

## 2018-04-26 LAB — CBC WITH DIFFERENTIAL/PLATELET
Basophils Absolute: 0 10*3/uL (ref 0.0–0.1)
Basophils Relative: 0 %
Eosinophils Absolute: 0.1 10*3/uL (ref 0.0–0.5)
Eosinophils Relative: 1 %
HCT: 33.4 % — ABNORMAL LOW (ref 36.0–46.0)
Hemoglobin: 9.7 g/dL — ABNORMAL LOW (ref 12.0–15.0)
Lymphocytes Relative: 28 %
Lymphs Abs: 1.8 10*3/uL (ref 0.7–4.0)
MCH: 21.7 pg — ABNORMAL LOW (ref 26.0–34.0)
MCHC: 29 g/dL — AB (ref 30.0–36.0)
MCV: 74.9 fL — AB (ref 80.0–100.0)
Monocytes Absolute: 0.3 10*3/uL (ref 0.1–1.0)
Monocytes Relative: 5 %
NEUTROS PCT: 66 %
Neutro Abs: 4.3 10*3/uL (ref 1.7–7.7)
Platelets: 357 10*3/uL (ref 150–400)
RBC: 4.46 MIL/uL (ref 3.87–5.11)
RDW: 17.7 % — ABNORMAL HIGH (ref 11.5–15.5)
WBC: 6.5 10*3/uL (ref 4.0–10.5)
nRBC: 0 % (ref 0.0–0.2)

## 2018-04-26 MED ORDER — AMLODIPINE BESYLATE 5 MG PO TABS: 5.0000 mg | ORAL_TABLET | Freq: Every day | ORAL | 0 refills | Status: DC

## 2018-04-26 NOTE — Discharge Instructions (Signed)
Dysfunctional Uterine Bleeding  Dysfunctional uterine bleeding is abnormal bleeding from the uterus. Dysfunctional uterine bleeding includes:  A menstrual period that comes earlier or later than usual.  A menstrual period that is lighter or heavier than usual, or has large blood clots.  Vaginal bleeding between menstrual periods.  Skipping one or more menstrual periods.  Vaginal bleeding after sex.  Vaginal bleeding after menopause.  Follow these instructions at home:  Eating and drinking    Eat well-balanced meals. Include foods that are high in iron, such as liver, meat, shellfish, green leafy vegetables, and eggs.  To prevent or treat constipation, your health care provider may recommend that you:  Drink enough fluid to keep your urine pale yellow.  Take over-the-counter or prescription medicines.  Eat foods that are high in fiber, such as beans, whole grains, and fresh fruits and vegetables.  Limit foods that are high in fat and processed sugars, such as fried or sweet foods.  Medicines  Take over-the-counter and prescription medicines only as told by your health care provider.  Do not change medicines without talking with your health care provider.  Aspirin or medicines that contain aspirin may make the bleeding worse. Do not take those medicines:  During the week before your menstrual period.  During your menstrual period.  If you were prescribed iron pills, take them as told by your health care provider. Iron pills help to replace iron that your body loses because of this condition.  Activity  If you need to change your sanitary pad or tampon more than one time every 2 hours:  Lie in bed with your feet raised (elevated).  Place a cold pack on your lower abdomen.  Rest as much as possible until the bleeding stops or slows down.  Do not try to lose weight until the bleeding has stopped and your blood iron level is back to normal.  General instructions    For two months, write down:  When your menstrual period  starts.  When your menstrual period ends.  When any abnormal vaginal bleeding occurs.  What problems you notice.  Keep all follow up visits as told by your health care provider. This is important.  Contact a health care provider if you:  Feel light-headed or weak.  Have nausea and vomiting.  Cannot eat or drink without vomiting.  Feel dizzy or have diarrhea while you are taking medicines.  Are taking birth control pills or hormones, and you want to change them or stop taking them.  Get help right away if:  You develop a fever or chills.  You need to change your sanitary pad or tampon more than one time per hour.  Your vaginal bleeding becomes heavier, or your flow contains clots more often.  You develop pain in your abdomen.  You lose consciousness.  You develop a rash.  Summary  Dysfunctional uterine bleeding is abnormal bleeding from the uterus.  It includes menstrual bleeding of abnormal duration, volume, or regularity.  Bleeding after sex and after menopause are also considered dysfunctional uterine bleeding.  This information is not intended to replace advice given to you by your health care provider. Make sure you discuss any questions you have with your health care provider.  Document Released: 02/17/2000 Document Revised: 07/31/2017 Document Reviewed: 07/31/2017  Elsevier Interactive Patient Education  2019 Elsevier Inc.

## 2018-04-26 NOTE — MAU Note (Signed)
Samantha Alexander is a 28 y.o. here in MAU reporting: SVD on jan 4. States her PP bleeding stopped after 4 weeks and then she started spotting feb 8 and it has increased and is now going through a pad every hour. Passing some small clots. Having some cramping. Pt is occasionally breast feeding but mostly formula.  Onset of complaint: feb 8  Pain score: 3/10  Vitals:   04/26/18 1304  BP: (!) 144/97  Pulse: 72  Resp: 18  Temp: 98.7 F (37.1 C)  SpO2: 100%      Lab orders placed from triage: none, will consult with provider for clean catch vs cath if necessary

## 2018-04-26 NOTE — MAU Provider Note (Signed)
History     CSN: [id number]  Arrival date and time: 04/26/18 1248   First Provider Initiated Contact with Patient 04/26/18 1324      Chief Complaint  Patient presents with  . Vaginal Bleeding  . Abdominal Pain   HPI [name] is a 28 y.o. G1P1001 who delivered on 03/08/18 who presents to MAU today with complaint of vaginal bleeding since 2/8. She states initially it was spotting for a few days and then bleeding was heavier. She states the last few days she has changed a pad q 1-1.5 hours, but this has been slightly less since last night. She has been taking Samantha Alexander and Samantha Alexander. She denies pain. She has mild weakness. She denies dizziness, fatigue, SOB or intercourse since the baby was born. She had a readmission PP for pre-eclampsia and was discharged on Samantha Alexander which was discontinued at a recent office visit. She denies HA, visual changes today.   OB History    Gravida  1   Para  1   Term  1   Preterm      AB      Living  1     SAB      TAB      Ectopic      Multiple  0   Live Births  1           Past Medical History:  Diagnosis Date  . Abscess of vulva   . Anemia   . Headache   . HPV (human papilloma virus) anogenital infection   . Infection    recurrent yeast infections throughout pregnancy  . Medical history non-contributory   . Pregnancy induced hypertension     Past Surgical History:  Procedure Laterality Date  . hemorrhoid ligation    . WISDOM TOOTH EXTRACTION      History reviewed. No pertinent family history.  Social History   Tobacco Use  . Smoking status: Never Smoker  . Smokeless tobacco: Never Used  Substance Use Topics  . Alcohol use: Never    Frequency: Never  . Drug use: Never    Allergies:  Allergies  Allergen Reactions  . Fentanyl Itching    No medications prior to admission.    Review of Systems  Constitutional: Negative for fatigue and fever.  Eyes: Negative for visual disturbance.   Gastrointestinal: Negative for abdominal pain, constipation, diarrhea, nausea and vomiting.  Genitourinary: Positive for vaginal bleeding. Negative for dysuria, frequency and urgency.  Neurological: Negative for dizziness, syncope and headaches.   Physical Exam   Blood pressure (!) 146/93, pulse 72, temperature 98.7 F (37.1 C), temperature source Oral, resp. rate 16, height 5\' 8"  (1.727 m), weight 100.2 kg, SpO2 100 %, unknown if currently breastfeeding.  Physical Exam  Nursing note and vitals reviewed. Constitutional: She is oriented to person, place, and time. She appears well-developed and well-nourished. No distress.  HENT:  Head: Normocephalic and atraumatic.  Cardiovascular: Normal rate.  Respiratory: Effort normal.  GI: Soft. She exhibits no distension and no mass. There is no abdominal tenderness. There is no rebound and no guarding.  Genitourinary: Uterus is not enlarged and not tender. Cervix exhibits no motion tenderness, no discharge and no friability. Right adnexum displays no mass and no tenderness. Left adnexum displays no mass and no tenderness.    Vaginal bleeding (small) present.     No vaginal discharge.  There is bleeding (small) in the vagina.  Neurological: She is alert and oriented to person, place, and  time.  Skin: Skin is warm and dry. No erythema.  Psychiatric: She has a normal mood and affect.    Results for orders placed or performed during the hospital encounter of 04/26/18 (from the past 24 hour(s))  CBC with Differential/Platelet     Status: Abnormal   Collection Time: 04/26/18  1:45 PM  Result Value Ref Range   WBC 6.5 4.0 - 10.5 K/uL   RBC 4.46 3.87 - 5.11 MIL/uL   Hemoglobin 9.7 (L) 12.0 - 15.0 g/dL   HCT 41.0 (L) 30.1 - 31.4 %   MCV 74.9 (L) 80.0 - 100.0 fL   MCH 21.7 (L) 26.0 - 34.0 pg   MCHC 29.0 (L) 30.0 - 36.0 g/dL   RDW 38.8 (H) 87.5 - 79.7 %   Platelets 357 150 - 400 K/uL   nRBC 0.0 0.0 - 0.2 %   Neutrophils Relative % 66 %   Neutro  Abs 4.3 1.7 - 7.7 K/uL   Lymphocytes Relative 28 %   Lymphs Abs 1.8 0.7 - 4.0 K/uL   Monocytes Relative 5 %   Monocytes Absolute 0.3 0.1 - 1.0 K/uL   Eosinophils Relative 1 %   Eosinophils Absolute 0.1 0.0 - 0.5 K/uL   Basophils Relative 0 %   Basophils Absolute 0.0 0.0 - 0.1 K/uL    MAU Course  Procedures None  MDM CBC stable from last admission  Discussed with Dr. Debroah Loop. Since patient is hemodynamically stable she can be discharged with bleeding precautions and follow-up with the office. Recommends starting on 5 mg daily Samantha Alexander due to BP today and follow-up in the office as scheduled on 05/15/18.  Assessment and Plan  A: Hypertension, chronic  Vaginal bleeding   P: Discharge home Rx for Samantha Alexander sent to the patient's pharmacy  Bleeding precautions discussed Patient advised to follow-up with [facility] as scheduled for routine PP or sooner if symptoms worsen  Patient may return to MAU as needed or if her condition were to change or worsen  Vonzella Nipple, PA-C 04/26/2018, 3:41 PM

## 2018-05-15 DIAGNOSIS — I10 Essential (primary) hypertension: Secondary | ICD-10-CM | POA: Diagnosis not present

## 2018-08-01 DIAGNOSIS — D539 Nutritional anemia, unspecified: Secondary | ICD-10-CM | POA: Diagnosis not present

## 2018-08-27 DIAGNOSIS — R42 Dizziness and giddiness: Secondary | ICD-10-CM | POA: Diagnosis not present

## 2018-08-27 DIAGNOSIS — R0600 Dyspnea, unspecified: Secondary | ICD-10-CM | POA: Diagnosis not present

## 2018-08-27 DIAGNOSIS — D509 Iron deficiency anemia, unspecified: Secondary | ICD-10-CM | POA: Diagnosis not present

## 2018-10-08 DIAGNOSIS — R9431 Abnormal electrocardiogram [ECG] [EKG]: Secondary | ICD-10-CM | POA: Diagnosis not present

## 2018-10-08 DIAGNOSIS — R079 Chest pain, unspecified: Secondary | ICD-10-CM | POA: Diagnosis not present

## 2018-10-09 ENCOUNTER — Other Ambulatory Visit: Payer: Self-pay | Admitting: Family Medicine

## 2018-10-09 ENCOUNTER — Other Ambulatory Visit (HOSPITAL_COMMUNITY): Payer: Self-pay | Admitting: Family Medicine

## 2018-10-09 DIAGNOSIS — R079 Chest pain, unspecified: Secondary | ICD-10-CM

## 2018-10-13 DIAGNOSIS — D509 Iron deficiency anemia, unspecified: Secondary | ICD-10-CM | POA: Diagnosis not present

## 2018-10-13 DIAGNOSIS — R072 Precordial pain: Secondary | ICD-10-CM | POA: Diagnosis not present

## 2018-10-13 DIAGNOSIS — Z8742 Personal history of other diseases of the female genital tract: Secondary | ICD-10-CM | POA: Diagnosis not present

## 2018-10-13 DIAGNOSIS — Z8719 Personal history of other diseases of the digestive system: Secondary | ICD-10-CM | POA: Diagnosis not present

## 2018-10-23 ENCOUNTER — Telehealth: Payer: Self-pay | Admitting: Physician Assistant

## 2018-10-23 ENCOUNTER — Other Ambulatory Visit: Payer: Self-pay

## 2018-10-23 ENCOUNTER — Ambulatory Visit (HOSPITAL_COMMUNITY)
Admission: RE | Admit: 2018-10-23 | Discharge: 2018-10-23 | Disposition: A | Discharge status: Home / Self Care | Payer: Medicaid Other | Source: Ambulatory Visit | Attending: Family Medicine | Admitting: Family Medicine

## 2018-10-23 DIAGNOSIS — R079 Chest pain, unspecified: Secondary | ICD-10-CM | POA: Diagnosis not present

## 2018-10-23 LAB — NM MYOCAR MULTI W/SPECT W/WALL MOTION / EF
MPHR: 193 {beats}/min
Peak HR: 108 {beats}/min
Percent HR: 55 %
Rest HR: 69 {beats}/min

## 2018-10-23 MED ORDER — TECHNETIUM TC 99M TETROFOSMIN IV KIT
30.0000 | PACK | Freq: Once | INTRAVENOUS | Status: AC | PRN
  Administered 2018-10-23: 30 via INTRAVENOUS

## 2018-10-23 MED ORDER — TECHNETIUM TC 99M TETROFOSMIN IV KIT
10.0000 | PACK | Freq: Once | INTRAVENOUS | Status: AC | PRN
  Administered 2018-10-23: 09:00:00 10 via INTRAVENOUS

## 2018-10-23 MED ORDER — REGADENOSON 0.4 MG/5ML IV SOLN: 0.4000 mg | Freq: Once | INTRAVENOUS | Status: DC

## 2018-10-23 MED ORDER — REGADENOSON 0.4 MG/5ML IV SOLN
INTRAVENOUS | Status: AC
  Administered 2018-10-23: 11:00:00
  Filled 2018-10-23: qty 5

## 2018-10-23 NOTE — Progress Notes (Signed)
Patient to have Lexiscan, not treadmill per Dayn a, [place], [place]. Patient informed and verbalized understanding. Lexiscan to be completed.

## 2018-10-23 NOTE — Progress Notes (Signed)
Lexiscan complete. Patient denies any complaints. Patient given snack and drink. Patient taken back to [place] to complete testing.

## 2018-10-23 NOTE — Telephone Encounter (Addendum)
Spoke with Samantha Alexander at Dr. Verdon Cummins office re: the message from [name]... [phone]).... she noted the pt had a Lexiscan as opposed to an ETT due to COVID precautions and verbalized back to me and will be sure to let Dr. Claudie Leach know. Will call us back if he has any further questions.   Note also faxed to his office through Epic for his records.

## 2018-10-23 NOTE — Telephone Encounter (Signed)
Thank you Lelon Frohlich! Can you also inquire how he typically gets results? We do not provide stress testing service for his office very often so just want to make sure he is getting the follow-up results on these patients since he is their cardiologist and is responsible for further action (rather than our team). I was previously told the results are sent to him but please double check with his office how they handle these since he's not a part of the [facility] system. Eren Ryser PA-C

## 2018-10-23 NOTE — Telephone Encounter (Signed)
   [facility] med department called me to proctor nuclear stress test on this patient, ordered by Dr. Claudie Leach ([place]). She recently had an ETT which was stopped suboptimally due to chest pain, therefore ETT-nuc was ordered. However, per discussion with our nuc med department, they are not presently doing ETTs due to the Covid-19 pandemic (aerosolizing procedure), only Lexiscan. Clinic note from cardiologist reviewed, no acute contraindications to [facility] in [facility]. Of note, LMP 10/04/18 and patient denies pregnancy; actively avoiding pregnancy. Test completed without complication. [facility] Radiology is assigned to read study. Further management of test results per Dr. Claudie Leach.  Will also forward this message to triage in our office to please contact Dr. Verdon Cummins office to send him a copy of this message and also let him know the hospital is currently deferring ETTs in lieu of Lexiscan due to Covid. It is my understanding, however, that we will be starting these up in the outpatient office with pre-Covid testing if they desire future testing.  Amberia Bayless PA-C

## 2018-10-24 NOTE — Telephone Encounter (Signed)
Spoke with [name] at [facility]... Dr. Claudie Leach sees pts at the [facility] location and they added their fax number [phone]) to the order that they sent [name] and with the pt... they have already received the results of the pts stress test from yesterday. She thanked me for the good communication and ease of having our office provide testing for their pt.

## 2018-10-27 DIAGNOSIS — R079 Chest pain, unspecified: Secondary | ICD-10-CM | POA: Diagnosis not present

## 2018-11-05 DIAGNOSIS — R0602 Shortness of breath: Secondary | ICD-10-CM | POA: Diagnosis not present

## 2018-11-05 DIAGNOSIS — R0600 Dyspnea, unspecified: Secondary | ICD-10-CM | POA: Diagnosis not present

## 2018-11-05 DIAGNOSIS — D509 Iron deficiency anemia, unspecified: Secondary | ICD-10-CM | POA: Diagnosis not present

## 2018-11-05 DIAGNOSIS — R0789 Other chest pain: Secondary | ICD-10-CM | POA: Diagnosis not present

## 2018-12-15 NOTE — Progress Notes (Signed)
HEMATOLOGY/ONCOLOGY CONSULTATION NOTE  Date of Service: 12/16/2018  Patient Care Team: Patient, No Pcp Per as PCP - General (General Practice)  CHIEF COMPLAINTS/PURPOSE OF CONSULTATION:  IDA  HISTORY OF PRESENTING ILLNESS:   Samantha Alexander is a wonderful 28 y.o. female who has been referred to [name] by Dr Francisca December for evaluation and management of iron deficiency. The pt reports that she is doing well overall.  The pt reports that she was first told that she was borderline anemic when she was about 28 years old. She was never given a reason for her anemia. Pt reports that over the last few years her period has gotten heavier. Her periods are typically 5-6 days and are very heavy for about 3 days. Pt has been given PO Ferrous Sulfate but is not very consistent in taking it, typically 2-3 times per week. She simply forgets or chooses not to take them. She has experienced constipation with PO Iron. The last time she took one was about a month ago. Pt has never had an IV Iron infusion but has been offered blood transfusions multiple times, with her last being offered 9 months ago around childbirth. She did not choose to go through with a blood transfusion at that time as she was concerned about the complications. Pt has had one confirmed pregnancy and has one child. Her Hgb was between 8.7-8.3 around the time of birth. Pt has been seeing Nigel Bridgeman, CNM under the supervision of Dr. Su Hilt at [facility] and will see her again next month. She is not currently on birth control and was not given any information on why her periods are heavy. She does not have any dietary restrictions and eats a fairly balanced diet. She is currently being weaned off of Amlodipine, taking a dose about every 3 days. She was placed on Amlodipine due to gestational HTN and eventually developed pre-eclampsia. They do not think that she has HTN at baseline. Pt breastfed for a month after birth. Her weight has  been fluctuating and she denies any unexpected weight loss.   Her fatigue is limiting and keeps her from certain activities. She does note some ice cravings and regularly eats 32 oz of ice at a time. Pt has hemorrhoids but they are not actively bleeding. She denies any bloody/black stools or other bleeding concerns.   Pt's 68 y/o sister has had Leukemia for about 10 years. There is a family history of anemia in women in her family, there are no other known blood/clotting disorders. She also has an aunt with CHF. Pt is a non-smoker, has no history of drug use and does not drink EtOH outside of social situations.   Most recent lab results (10/13/2018) of CBC is as follows: WBC at 6.9K, RBC at 4.71, Hgb at 9.9, HCT at 34.3, MCV at 73, MCH at 21, MCHC at 28.9, PLTs at 423K, RDW at 18.7, MPV at 12.1, Neutro Rel at 60.1, Lymphs Rel at 30.0, Monos Rel at 8.3, Eos Rel at 0.7, Basos Rel at 0.6, Sodium] at 138, Potassium at 4.4, Chloride at 104, CO2 at 27.0, Anion Gap at 7.00, Glucose at 95, BUN at 12, Creatinine at 1.0, BUN/Creatinine Ratio at 12.0, Total Protein at 7.1, Albumin at 4.5, Globulin at 2.6, A/G Ratio at 1.7, Total Bilirubin at 0.300, CA at 9.5, Alkaline Phosphatase at 84, AST at 11, ALT at 10, GFR at 70.54. 10/13/2018 Ferritin at 12.20  10/13/2018 Iron at 18, UIBC at 508.00, TIBC at  526.00   On review of systems, pt reports fatigue and denies abdominal pain, urinary problems, bleeding hemorrhoids, bloody/black stools, other bleeding concerns, unexpected weight loss and any other symptoms.   On PMHx the pt reports gestational HTN, pre-eclampsia, Menorrhagia. On Social Hx the pt reports she is a non-smoker, has no history of drug use and does not drink EtOH outside of social situations On Family Hx the pt reports a sister with Leukemia and an aunt with CHF   MEDICAL HISTORY:  Past Medical History:  Diagnosis Date   Abscess of vulva    Anemia    Headache    HPV (human papilloma virus)  anogenital infection    Infection    recurrent yeast infections throughout pregnancy   Medical history non-contributory    Pregnancy induced hypertension     SURGICAL HISTORY: Past Surgical History:  Procedure Laterality Date   hemorrhoid ligation     WISDOM TOOTH EXTRACTION      SOCIAL HISTORY: Social History   Socioeconomic History   Marital status: Single    Spouse name: Not on file   Number of children: Not on file   Years of education: Not on file   Highest education level: Not on file  Occupational History   Not on file  Social Needs   Financial resource strain: Not on file   Food insecurity    Worry: Not on file    Inability: Not on file   Transportation needs    Medical: Not on file    Non-medical: Not on file  Tobacco Use   Smoking status: Never Smoker   Smokeless tobacco: Never Used  Substance and Sexual Activity   Alcohol use: Never    Frequency: Never   Drug use: Never   Sexual activity: Not Currently  Lifestyle   Physical activity    Days per week: Not on file    Minutes per session: Not on file   Stress: Not on file  Relationships   Social connections    Talks on phone: Not on file    Gets together: Not on file    Attends religious service: Not on file    Active member of club or organization: Not on file    Attends meetings of clubs or organizations: Not on file    Relationship status: Not on file   Intimate partner violence    Fear of current or ex partner: Not on file    Emotionally abused: Not on file    Physically abused: Not on file    Forced sexual activity: Not on file  Other Topics Concern   Not on file  Social History Narrative   Not on file    FAMILY HISTORY: No family history on file.  ALLERGIES:  is allergic to fentanyl.  MEDICATIONS:  Current Outpatient Medications  Medication Sig Dispense Refill   amLODipine (NORVASC) 5 MG tablet Take 1 tablet (5 mg total) by mouth daily. 30 tablet 0    butalbital-acetaminophen-caffeine (FIORICET, ESGIC) 50-325-40 MG tablet Take 1 tablet by mouth every 6 (six) hours as needed for headache (Try tylenol first). 14 tablet 0   ferrous sulfate (FERROUSUL) 325 (65 FE) MG tablet Take 1 tablet (325 mg total) by mouth 2 (two) times daily. 60 tablet 1   iron polysaccharides (NIFEREX) 150 MG capsule Take 1 capsule (150 mg total) by mouth 2 (two) times daily. 60 capsule 5   No current facility-administered medications for this visit.     REVIEW OF  SYSTEMS:    10 Point review of Systems was done is negative except as noted above.  PHYSICAL EXAMINATION: ECOG PERFORMANCE STATUS: 2 - Symptomatic, <50% confined to bed  . Vitals:   12/16/18 1342  BP: 134/88  Pulse: 85  Resp: 18  Temp: 98.9 F (37.2 C)  SpO2: 100%   Filed Weights   12/16/18 1342  Weight: 216 lb 4.8 oz (98.1 kg)   .Body mass index is 32.89 kg/m.  GENERAL:alert, in no acute distress and comfortable SKIN: no acute rashes, no significant lesions EYES: conjunctiva are pink and non-injected, sclera anicteric OROPHARYNX: MMM, no exudates, no oropharyngeal erythema or ulceration NECK: supple, no JVD LYMPH:  no palpable lymphadenopathy in the cervical, axillary or inguinal regions LUNGS: clear to auscultation b/l with normal respiratory effort HEART: regular rate & rhythm ABDOMEN:  normoactive bowel sounds , non tender, not distended. Extremity: no pedal edema PSYCH: alert & oriented x 3 with fluent speech NEURO: no focal motor/sensory deficits  LABORATORY DATA:  I have reviewed the data as listed  . CBC Latest Ref Rng & Units 12/16/2018 04/26/2018 03/14/2018  WBC 4.0 - 10.5 K/uL 7.6 6.5 9.3  Hemoglobin 12.0 - 15.0 g/dL 4.0(J9.6(L) 8.1(X9.7(L) 9.1(Y8.2(L)  Hematocrit 36.0 - 46.0 % 31.3(L) 33.4(L) 26.8(L)  Platelets 150 - 400 K/uL 348 357 314    . CMP Latest Ref Rng & Units 12/16/2018 03/14/2018 03/13/2018  Glucose 70 - 99 mg/dL 89 97 91  BUN 6 - 20 mg/dL 12 15 17   Creatinine 0.44 -  1.00 mg/dL 7.820.83 9.560.81 2.13(Y1.03(H)  Sodium 135 - 145 mmol/L 141 138 138  Potassium 3.5 - 5.1 mmol/L 3.7 3.8 3.8  Chloride 98 - 111 mmol/L 108 108 107  CO2 22 - 32 mmol/L 25 22 22   Calcium 8.9 - 10.3 mg/dL 9.3 8.6(V7.8(L) 7.8(I8.7(L)  Total Protein 6.5 - 8.1 g/dL 7.3 6.2(L) 7.0  Total Bilirubin 0.3 - 1.2 mg/dL 0.3 0.6 0.4  Alkaline Phos 38 - 126 U/L 86 109 124  AST 15 - 41 U/L 11(L) 56(H) 71(H)  ALT 0 - 44 U/L 9 53(H) 63(H)     RADIOGRAPHIC STUDIES: I have personally reviewed the radiological images as listed and agreed with the findings in the report. No results found.  ASSESSMENT & PLAN:   1) Iron deficiency Anemia- due to menorrhagia. . Lab Results  Component Value Date   IRON 16 (L) 12/16/2018   TIBC 393 12/16/2018   IRONPCTSAT 4 (L) 12/16/2018   (Iron and TIBC)  Lab Results  Component Value Date   FERRITIN 6 (L) 12/16/2018   PLAN: -Discussed patient's most recent labs from 10/13/2018, WBC at 6.9K, RBC at 4.71, Hgb at 9.9, HCT at 34.3, MCV at 73, MCH at 21, MCHC at 28.9, PLTs at 423K, RDW at 18.7, MPV at 12.1, Neutro Rel at 60.1, Lymphs Rel at 30.0, Monos Rel at 8.3, Eos Rel at 0.7, Basos Rel at 0.6, Sodium] at 138, Potassium at 4.4, Chloride at 104, CO2 at 27.0, Anion Gap at 7.00, Glucose at 95, BUN at 12, Creatinine at 1.0, BUN/Creatinine Ratio at 12.0, Total Protein at 7.1, Albumin at 4.5, Globulin at 2.6, A/G Ratio at 1.7, Total Bilirubin at 0.300, CA at 9.5, Alkaline Phosphatase at 84, AST at 11, ALT at 10, GFR at 70.54. -Discussed 10/13/2018 Ferritin at 12.20  -Discussed 10/13/2018 Iron at 18, UIBC at 508.00, TIBC at 526.00  -Discussed PO Iron polysaccharide vs. IV Iron  -Recommended pt take PO Iron polysaccharide BID -Pt advised  to contact if there are any issues taking PO Iron polysaccharide 150mg  po BID -Will get labs today  -Will see back in 10 weeks with labs  FOLLOW UP: Labs today RTC with Dr with labs in 10 weeks  All of the patients questions were answered with  apparent satisfaction. The patient knows to call the clinic with any problems, questions or concerns.  I spent 25 mins counseling the patient face to face. The total time spent in the appointment was 35 minutes and more than 50% was on counseling and direct patient cares.    Candise Che MD MS AAHIVMS [facility] Hematology/Oncology Physician [facility]  (Office):       [phone] (Work cell):  [phone] (Fax):           [phone]  12/16/2018 3:49 PM  I, 12/18/2018, am acting as a scribe for Dr. Carollee Herter.   .I have reviewed the above documentation for accuracy and completeness, and I agree with the above. Wyvonnia Lora MD

## 2018-12-16 ENCOUNTER — Inpatient Hospital Stay: Payer: Medicaid Other

## 2018-12-16 ENCOUNTER — Other Ambulatory Visit: Payer: Self-pay

## 2018-12-16 ENCOUNTER — Inpatient Hospital Stay: Payer: Medicaid Other | Attending: Hematology | Admitting: Hematology

## 2018-12-16 VITALS — BP 134/88 | HR 85 | Temp 98.9°F | Resp 18 | Ht 68.0 in | Wt 216.3 lb

## 2018-12-16 DIAGNOSIS — D509 Iron deficiency anemia, unspecified: Secondary | ICD-10-CM

## 2018-12-16 DIAGNOSIS — N920 Excessive and frequent menstruation with regular cycle: Secondary | ICD-10-CM

## 2018-12-16 DIAGNOSIS — D500 Iron deficiency anemia secondary to blood loss (chronic): Secondary | ICD-10-CM | POA: Insufficient documentation

## 2018-12-16 DIAGNOSIS — Z79899 Other long term (current) drug therapy: Secondary | ICD-10-CM | POA: Insufficient documentation

## 2018-12-16 DIAGNOSIS — Z8249 Family history of ischemic heart disease and other diseases of the circulatory system: Secondary | ICD-10-CM | POA: Diagnosis not present

## 2018-12-16 DIAGNOSIS — K5900 Constipation, unspecified: Secondary | ICD-10-CM | POA: Insufficient documentation

## 2018-12-16 LAB — IRON AND TIBC
Iron: 16 ug/dL — ABNORMAL LOW (ref 41–142)
Saturation Ratios: 4 % — ABNORMAL LOW (ref 21–57)
TIBC: 393 ug/dL (ref 236–444)
UIBC: 376 ug/dL (ref 120–384)

## 2018-12-16 LAB — CBC WITH DIFFERENTIAL/PLATELET
Abs Immature Granulocytes: 0.01 10*3/uL (ref 0.00–0.07)
Basophils Absolute: 0 10*3/uL (ref 0.0–0.1)
Basophils Relative: 0 %
Eosinophils Absolute: 0.1 10*3/uL (ref 0.0–0.5)
Eosinophils Relative: 1 %
HCT: 31.3 % — ABNORMAL LOW (ref 36.0–46.0)
Hemoglobin: 9.6 g/dL — ABNORMAL LOW (ref 12.0–15.0)
Immature Granulocytes: 0 %
Lymphocytes Relative: 25 %
Lymphs Abs: 1.9 10*3/uL (ref 0.7–4.0)
MCH: 22 pg — ABNORMAL LOW (ref 26.0–34.0)
MCHC: 30.7 g/dL (ref 30.0–36.0)
MCV: 71.8 fL — ABNORMAL LOW (ref 80.0–100.0)
Monocytes Absolute: 0.6 10*3/uL (ref 0.1–1.0)
Monocytes Relative: 8 %
Neutro Abs: 5 10*3/uL (ref 1.7–7.7)
Neutrophils Relative %: 66 %
Platelets: 348 10*3/uL (ref 150–400)
RBC: 4.36 MIL/uL (ref 3.87–5.11)
RDW: 17.4 % — ABNORMAL HIGH (ref 11.5–15.5)
WBC: 7.6 10*3/uL (ref 4.0–10.5)
nRBC: 0 % (ref 0.0–0.2)

## 2018-12-16 LAB — CMP (CANCER CENTER ONLY)
ALT: 9 U/L (ref 0–44)
AST: 11 U/L — ABNORMAL LOW (ref 15–41)
Albumin: 4 g/dL (ref 3.5–5.0)
Alkaline Phosphatase: 86 U/L (ref 38–126)
Anion gap: 8 (ref 5–15)
BUN: 12 mg/dL (ref 6–20)
CO2: 25 mmol/L (ref 22–32)
Calcium: 9.3 mg/dL (ref 8.9–10.3)
Chloride: 108 mmol/L (ref 98–111)
Creatinine: 0.83 mg/dL (ref 0.44–1.00)
GFR, Est AFR Am: >60 mL/min (ref >60)
GFR, Estimated: >60 mL/min (ref >60)
Glucose, Bld: 89 mg/dL (ref 70–99)
Potassium: 3.7 mmol/L (ref 3.5–5.1)
Sodium: 141 mmol/L (ref 135–145)
Total Bilirubin: 0.3 mg/dL (ref 0.3–1.2)
Total Protein: 7.3 g/dL (ref 6.5–8.1)

## 2018-12-16 LAB — FERRITIN: Ferritin: 6 ng/mL — ABNORMAL LOW (ref 11–307)

## 2018-12-16 LAB — VITAMIN B12: Vitamin B-12: 369 pg/mL (ref 180–914)

## 2018-12-16 MED ORDER — POLYSACCHARIDE IRON COMPLEX 150 MG PO CAPS: 150.0000 mg | ORAL_CAPSULE | Freq: Two times a day (BID) | ORAL | 5 refills | Status: DC

## 2018-12-17 ENCOUNTER — Telehealth: Payer: Self-pay | Admitting: Hematology

## 2018-12-17 NOTE — Telephone Encounter (Signed)
I talk with patient regarding schedule  

## 2019-01-05 DIAGNOSIS — G4700 Insomnia, unspecified: Secondary | ICD-10-CM | POA: Diagnosis not present

## 2019-01-05 DIAGNOSIS — E611 Iron deficiency: Secondary | ICD-10-CM | POA: Diagnosis not present

## 2019-01-06 DIAGNOSIS — Z124 Encounter for screening for malignant neoplasm of cervix: Secondary | ICD-10-CM | POA: Diagnosis not present

## 2019-02-12 DIAGNOSIS — R102 Pelvic and perineal pain: Secondary | ICD-10-CM | POA: Diagnosis not present

## 2019-02-23 ENCOUNTER — Telehealth: Payer: Self-pay | Admitting: Hematology

## 2019-02-23 NOTE — Telephone Encounter (Signed)
Returned patient's phone call regarding rescheduling 12/22 appointment, per patient's request appointment has moved to 01/19.

## 2019-02-24 ENCOUNTER — Inpatient Hospital Stay: Payer: Medicaid Other | Admitting: Hematology

## 2019-02-24 ENCOUNTER — Inpatient Hospital Stay: Payer: Medicaid Other

## 2019-03-20 ENCOUNTER — Telehealth: Payer: Self-pay | Admitting: Hematology

## 2019-03-20 NOTE — Telephone Encounter (Signed)
Returned patient's phone call regarding cancelling 01/19 appointment, per patient's request appointment has been cancelled. 

## 2019-03-24 ENCOUNTER — Inpatient Hospital Stay: Payer: Medicaid Other | Admitting: Hematology

## 2019-03-24 ENCOUNTER — Inpatient Hospital Stay: Payer: Medicaid Other

## 2019-07-21 DIAGNOSIS — D509 Iron deficiency anemia, unspecified: Secondary | ICD-10-CM | POA: Diagnosis not present

## 2019-07-24 DIAGNOSIS — D509 Iron deficiency anemia, unspecified: Secondary | ICD-10-CM | POA: Diagnosis not present

## 2019-07-24 DIAGNOSIS — E559 Vitamin D deficiency, unspecified: Secondary | ICD-10-CM | POA: Diagnosis not present

## 2019-07-24 DIAGNOSIS — R7303 Prediabetes: Secondary | ICD-10-CM | POA: Diagnosis not present

## 2019-07-24 DIAGNOSIS — E0590 Thyrotoxicosis, unspecified without thyrotoxic crisis or storm: Secondary | ICD-10-CM | POA: Diagnosis not present

## 2019-09-17 DIAGNOSIS — U071 COVID-19: Secondary | ICD-10-CM | POA: Diagnosis not present

## 2019-09-17 DIAGNOSIS — D509 Iron deficiency anemia, unspecified: Secondary | ICD-10-CM | POA: Diagnosis not present

## 2019-09-25 DIAGNOSIS — G5622 Lesion of ulnar nerve, left upper limb: Secondary | ICD-10-CM | POA: Diagnosis not present

## 2019-09-25 DIAGNOSIS — E559 Vitamin D deficiency, unspecified: Secondary | ICD-10-CM | POA: Diagnosis not present

## 2020-02-28 DIAGNOSIS — Z03818 Encounter for observation for suspected exposure to other biological agents ruled out: Secondary | ICD-10-CM | POA: Diagnosis not present

## 2020-02-28 DIAGNOSIS — Z20822 Contact with and (suspected) exposure to covid-19: Secondary | ICD-10-CM | POA: Diagnosis not present

## 2020-04-06 DIAGNOSIS — Z20822 Contact with and (suspected) exposure to covid-19: Secondary | ICD-10-CM | POA: Diagnosis not present

## 2020-04-13 DIAGNOSIS — Z20822 Contact with and (suspected) exposure to covid-19: Secondary | ICD-10-CM | POA: Diagnosis not present

## 2020-04-18 ENCOUNTER — Ambulatory Visit (INDEPENDENT_AMBULATORY_CARE_PROVIDER_SITE_OTHER): Payer: Medicaid Other | Admitting: Family Medicine

## 2020-04-18 ENCOUNTER — Encounter: Payer: Self-pay | Admitting: Family Medicine

## 2020-04-18 ENCOUNTER — Other Ambulatory Visit: Payer: Self-pay

## 2020-04-18 VITALS — BP 138/77 | HR 104 | Temp 98.5°F | Ht 68.0 in | Wt 216.8 lb

## 2020-04-18 DIAGNOSIS — Z8616 Personal history of COVID-19: Secondary | ICD-10-CM | POA: Diagnosis not present

## 2020-04-18 DIAGNOSIS — Z0289 Encounter for other administrative examinations: Secondary | ICD-10-CM | POA: Diagnosis not present

## 2020-04-18 DIAGNOSIS — U071 COVID-19: Secondary | ICD-10-CM

## 2020-04-18 NOTE — Patient Instructions (Addendum)
  Health Maintenance, Female Adopting a healthy lifestyle and getting preventive care are important in promoting health and wellness. Ask your health care provider about:  The right schedule for you to have regular tests and exams.  Things you can do on your own to prevent diseases and keep yourself healthy. What should I know about diet, weight, and exercise? Eat a healthy diet  Eat a diet that includes plenty of vegetables, fruits, low-fat dairy products, and lean protein.  Do not eat a lot of foods that are high in solid fats, added sugars, or sodium.   Maintain a healthy weight Body mass index (BMI) is used to identify weight problems. It estimates body fat based on height and weight. Your health care provider can help determine your BMI and help you achieve or maintain a healthy weight. Get regular exercise Get regular exercise. This is one of the most important things you can do for your health. Most adults should:  Exercise for at least 150 minutes each week. The exercise should increase your heart rate and make you sweat (moderate-intensity exercise).  Do strengthening exercises at least twice a week. This is in addition to the moderate-intensity exercise.  Spend less time sitting. Even light physical activity can be beneficial. Watch cholesterol and blood lipids Have your blood tested for lipids and cholesterol at 29 years of age, then have this test every 5 years. Have your cholesterol levels checked more often if:  Your lipid or cholesterol levels are high.  You are older than 29 years of age.  You are at high risk for heart disease. What should I know about cancer screening? Depending on your health history and family history, you may need to have cancer screening at various ages. This may include screening for:  Breast cancer.  Cervical cancer.  Colorectal cancer.  Skin cancer.  Lung cancer. What should I know about heart disease, diabetes, and high blood  pressure? Blood pressure and heart disease  High blood pressure causes heart disease and increases the risk of stroke. This is more likely to develop in people who have high blood pressure readings, are of African descent, or are overweight.  Have your blood pressure checked: ? Every 3-5 years if you are 18-39 years of age. ? Every year if you are 40 years old or older. Diabetes Have regular diabetes screenings. This checks your fasting blood sugar level. Have the screening done:  Once every three years after age 40 if you are at a normal weight and have a low risk for diabetes.  More often and at a younger age if you are overweight or have a high risk for diabetes. What should I know about preventing infection? Hepatitis B If you have a higher risk for hepatitis B, you should be screened for this virus. Talk with your health care provider to find out if you are at risk for hepatitis B infection. Hepatitis C Testing is recommended for:  Everyone born from 1945 through 1965.  Anyone with known risk factors for hepatitis C. Sexually transmitted infections (STIs)  Get screened for STIs, including gonorrhea and chlamydia, if: ? You are sexually active and are younger than 29 years of age. ? You are older than 29 years of age and your health care provider tells you that you are at risk for this type of infection. ? Your sexual activity has changed since you were last screened, and you are at increased risk for chlamydia or gonorrhea. Ask your health   care provider if you are at risk.  Ask your health care provider about whether you are at high risk for HIV. Your health care provider may recommend a prescription medicine to help prevent HIV infection. If you choose to take medicine to prevent HIV, you should first get tested for HIV. You should then be tested every 3 months for as long as you are taking the medicine. Pregnancy  If you are about to stop having your period (premenopausal) and  you may become pregnant, seek counseling before you get pregnant.  Take 400 to 800 micrograms (mcg) of folic acid every day if you become pregnant.  Ask for birth control (contraception) if you want to prevent pregnancy. Osteoporosis and menopause Osteoporosis is a disease in which the bones lose minerals and strength with aging. This can result in bone fractures. If you are 65 years old or older, or if you are at risk for osteoporosis and fractures, ask your health care provider if you should:  Be screened for bone loss.  Take a calcium or vitamin D supplement to lower your risk of fractures.  Be given hormone replacement therapy (HRT) to treat symptoms of menopause. Follow these instructions at home: Lifestyle  Do not use any products that contain nicotine or tobacco, such as cigarettes, e-cigarettes, and chewing tobacco. If you need help quitting, ask your health care provider.  Do not use street drugs.  Do not share needles.  Ask your health care provider for help if you need support or information about quitting drugs. Alcohol use  Do not drink alcohol if: ? Your health care provider tells you not to drink. ? You are pregnant, may be pregnant, or are planning to become pregnant.  If you drink alcohol: ? Limit how much you use to 0-1 drink a day. ? Limit intake if you are breastfeeding.  Be aware of how much alcohol is in your drink. In the [place] one drink equals one 12 oz bottle of beer (355 mL), one 5 oz glass of wine (148 mL), or one 1 oz glass of hard liquor (44 mL). General instructions  Schedule regular health, dental, and eye exams.  Stay current with your vaccines.  Tell your health care provider if: ? You often feel depressed. ? You have ever been abused or do not feel safe at home. Summary  Adopting a healthy lifestyle and getting preventive care are important in promoting health and wellness.  Follow your health care provider's instructions about healthy  diet, exercising, and getting tested or screened for diseases.  Follow your health care provider's instructions on monitoring your cholesterol and blood pressure. This information is not intended to replace advice given to you by your health care provider. Make sure you discuss any questions you have with your health care provider. Document Revised: 02/12/2018 Document Reviewed: 02/12/2018 Elsevier Patient Education  2021 Elsevier Inc.   If you have lab work done today you will be contacted with your lab results within the next 2 weeks.  If you have not heard from us then please contact us. The fastest way to get your results is to register for My Chart.   IF you received an x-ray today, you will receive an invoice from [facility]. Please contact [facility] at [phone] with questions or concerns regarding your invoice.   IF you received labwork today, you will receive an invoice from [facility]. Please contact [facility] at [phone] with questions or concerns regarding your invoice.   Our billing   staff will not be able to assist you with questions regarding bills from these companies.  You will be contacted with the lab results as soon as they are available. The fastest way to get your results is to activate your My Chart account. Instructions are located on the last page of this paperwork. If you have not heard from us regarding the results in 2 weeks, please contact this office.      

## 2020-04-18 NOTE — Progress Notes (Signed)
2/14/20224:08 PM  Samantha Alexander 23-Oct-1990, 30 y.o., female [id number]  Chief Complaint  Patient presents with  . FORMS     POST COVID FORM - work accommodation     HPI:   Patient is a 30 y.o. female with past medical history significant for anemia who presents today for form completion post covid.  Works at [employer] positive for covid 2/3 Chills body aches sweats at that time Since then symptoms have resolved  Anemia managed by hematology Declines labs at this time   Depression screen [facility] 2/9 04/18/2020  Decreased Interest 0  Down, Depressed, Hopeless 0  PHQ - 2 Score 0    Fall Risk  04/18/2020  Falls in the past year? 0  Number falls in past yr: 0  Injury with Fall? 0  Follow up Falls evaluation completed     Allergies  Allergen Reactions  . Fentanyl Itching    Prior to Admission medications   Medication Sig Start Date End Date Taking? Authorizing Provider  LOSARTAN POTASSIUM PO Take 25 mg by mouth daily.   Yes [provider]    Past Medical History:  Diagnosis Date  . Abscess of vulva   . Anemia   . Headache   . HPV (human papilloma virus) anogenital infection   . Infection    recurrent yeast infections throughout pregnancy  . Medical history non-contributory   . Pregnancy induced hypertension     Past Surgical History:  Procedure Laterality Date  . hemorrhoid ligation    . WISDOM TOOTH EXTRACTION      Social History   Tobacco Use  . Smoking status: Never Smoker  . Smokeless tobacco: Never Used  Substance Use Topics  . Alcohol use: Never    History reviewed. No pertinent family history.  Review of Systems  Constitutional: Negative for chills, fever and malaise/fatigue.  Eyes: Negative for blurred vision and double vision.  Respiratory: Negative for cough, shortness of breath and wheezing.   Cardiovascular: Negative for chest pain, palpitations and leg swelling.  Gastrointestinal: Negative for abdominal pain, blood  in stool, constipation, diarrhea, heartburn, nausea and vomiting.  Genitourinary: Negative for dysuria, frequency and hematuria.  Musculoskeletal: Negative for back pain and joint pain.  Skin: Negative for rash.  Neurological: Negative for dizziness, weakness and headaches.     OBJECTIVE:  Today's Vitals   04/18/20 1442  BP: 138/77  Pulse: (!) 104  Temp: 98.5 F (36.9 C)  TempSrc: Temporal  SpO2: 99%  Weight: 216 lb 12.8 oz (98.3 kg)  Height: 5\' 8"  (1.727 m)   Body mass index is 32.96 kg/m.   Physical Exam Constitutional:      General: She is not in acute distress.    Appearance: Normal appearance. She is not ill-appearing.  HENT:     Head: Normocephalic.  Cardiovascular:     Rate and Rhythm: Normal rate and regular rhythm.     Pulses: Normal pulses.     Heart sounds: Normal heart sounds. No murmur heard. No friction rub. No gallop.   Pulmonary:     Effort: Pulmonary effort is normal. No respiratory distress.     Breath sounds: Normal breath sounds. No stridor. No wheezing, rhonchi or rales.  Abdominal:     General: Bowel sounds are normal.     Palpations: Abdomen is soft.     Tenderness: There is no abdominal tenderness.  Musculoskeletal:     Right lower leg: No edema.     Left lower leg: No  edema.  Skin:    General: Skin is warm and dry.  Neurological:     Mental Status: She is alert and oriented to person, place, and time.  Psychiatric:        Mood and Affect: Mood normal.        Behavior: Behavior normal.     No results found for this or any previous visit (from the past 24 hour(s)).  No results found.   ASSESSMENT and PLAN  Problem List Items Addressed This Visit   None   Visit Diagnoses    COVID    -  Primary   Encounter for completion of form with patient          Plan . Forms completed, will place a copy in chart . RTC/ED precautions provided . All questions answered   Return if symptoms worsen or fail to improve.    Macario Carls  Samantha Barrientes, FNP-BC Primary Care at [facility] [address] [place], [place] [address] Ph.  [phone] Fax [phone]

## 2020-09-02 DIAGNOSIS — Z419 Encounter for procedure for purposes other than remedying health state, unspecified: Secondary | ICD-10-CM | POA: Diagnosis not present

## 2020-10-03 DIAGNOSIS — Z419 Encounter for procedure for purposes other than remedying health state, unspecified: Secondary | ICD-10-CM | POA: Diagnosis not present

## 2020-10-04 ENCOUNTER — Other Ambulatory Visit: Payer: Self-pay

## 2020-10-04 ENCOUNTER — Ambulatory Visit (INDEPENDENT_AMBULATORY_CARE_PROVIDER_SITE_OTHER): Payer: Medicaid Other

## 2020-10-04 ENCOUNTER — Encounter: Payer: Self-pay | Admitting: Family Medicine

## 2020-10-04 VITALS — BP 124/97 | HR 87 | Ht 68.0 in | Wt 220.0 lb

## 2020-10-04 DIAGNOSIS — Z3201 Encounter for pregnancy test, result positive: Secondary | ICD-10-CM | POA: Diagnosis not present

## 2020-10-04 LAB — POCT PREGNANCY, URINE: Preg Test, Ur: POSITIVE — AB

## 2020-10-04 NOTE — Progress Notes (Addendum)
Pt here today for UPT. UPT in office today was positive. Results given to pt. Pt verbalized understanding.  LMP 08/28/20- exact.  EDD 06/04/21 [redacted]w[redacted]d  Pt denies any vaginal bleeding, cramps or abd pain at this time.  Pt states is taking PNV. Pt advised to continue to take PNV and call OB office to establish care. Pt is seen at [facility]. Pt agreeable to plan of care.   Judeth Cornfield, RN    Chart reviewed for nurse visit. Agree with plan of care.   Currie Paris, NP 10/04/2020 1:31 PM

## 2020-10-11 DIAGNOSIS — O139 Gestational [pregnancy-induced] hypertension without significant proteinuria, unspecified trimester: Secondary | ICD-10-CM | POA: Diagnosis not present

## 2020-10-24 DIAGNOSIS — Z0130 Encounter for examination of blood pressure without abnormal findings: Secondary | ICD-10-CM | POA: Diagnosis not present

## 2020-10-31 ENCOUNTER — Other Ambulatory Visit: Payer: Self-pay

## 2020-10-31 ENCOUNTER — Encounter (HOSPITAL_COMMUNITY): Payer: Self-pay | Admitting: Obstetrics and Gynecology

## 2020-10-31 ENCOUNTER — Inpatient Hospital Stay (HOSPITAL_COMMUNITY)
Admission: AD | Admit: 2020-10-31 | Discharge: 2020-11-01 | Disposition: A | Discharge status: Home / Self Care | Payer: Medicaid Other | Attending: Obstetrics and Gynecology | Admitting: Obstetrics and Gynecology

## 2020-10-31 DIAGNOSIS — K5900 Constipation, unspecified: Secondary | ICD-10-CM

## 2020-10-31 DIAGNOSIS — R109 Unspecified abdominal pain: Secondary | ICD-10-CM | POA: Diagnosis not present

## 2020-10-31 DIAGNOSIS — Z885 Allergy status to narcotic agent status: Secondary | ICD-10-CM | POA: Diagnosis not present

## 2020-10-31 DIAGNOSIS — Z79899 Other long term (current) drug therapy: Secondary | ICD-10-CM | POA: Insufficient documentation

## 2020-10-31 DIAGNOSIS — O99611 Diseases of the digestive system complicating pregnancy, first trimester: Secondary | ICD-10-CM | POA: Insufficient documentation

## 2020-10-31 DIAGNOSIS — O26891 Other specified pregnancy related conditions, first trimester: Secondary | ICD-10-CM | POA: Diagnosis not present

## 2020-10-31 DIAGNOSIS — O219 Vomiting of pregnancy, unspecified: Secondary | ICD-10-CM

## 2020-10-31 DIAGNOSIS — O30042 Twin pregnancy, dichorionic/diamniotic, second trimester: Secondary | ICD-10-CM | POA: Diagnosis not present

## 2020-10-31 DIAGNOSIS — O30041 Twin pregnancy, dichorionic/diamniotic, first trimester: Secondary | ICD-10-CM | POA: Diagnosis not present

## 2020-10-31 DIAGNOSIS — O98812 Other maternal infectious and parasitic diseases complicating pregnancy, second trimester: Secondary | ICD-10-CM | POA: Diagnosis not present

## 2020-10-31 DIAGNOSIS — Z3A09 9 weeks gestation of pregnancy: Secondary | ICD-10-CM | POA: Diagnosis not present

## 2020-10-31 DIAGNOSIS — Z3A16 16 weeks gestation of pregnancy: Secondary | ICD-10-CM | POA: Diagnosis not present

## 2020-10-31 DIAGNOSIS — B3731 Acute candidiasis of vulva and vagina: Secondary | ICD-10-CM | POA: Diagnosis not present

## 2020-10-31 DIAGNOSIS — Z3A10 10 weeks gestation of pregnancy: Secondary | ICD-10-CM | POA: Diagnosis not present

## 2020-10-31 LAB — COMPREHENSIVE METABOLIC PANEL
ALT: 22 U/L (ref 0–44)
AST: 16 U/L (ref 15–41)
Albumin: 3.6 g/dL (ref 3.5–5.0)
Alkaline Phosphatase: 98 U/L (ref 38–126)
Anion gap: 9 (ref 5–15)
BUN: 10 mg/dL (ref 6–20)
CO2: 23 mmol/L (ref 22–32)
Calcium: 9.5 mg/dL (ref 8.9–10.3)
Chloride: 102 mmol/L (ref 98–111)
Creatinine, Ser: 0.74 mg/dL (ref 0.44–1.00)
GFR, Estimated: >60 mL/min (ref >60)
Glucose, Bld: 85 mg/dL (ref 70–99)
Potassium: 3.7 mmol/L (ref 3.5–5.1)
Sodium: 134 mmol/L — ABNORMAL LOW (ref 135–145)
Total Bilirubin: 0.3 mg/dL (ref 0.3–1.2)
Total Protein: 7.3 g/dL (ref 6.5–8.1)

## 2020-10-31 LAB — URINALYSIS, ROUTINE W REFLEX MICROSCOPIC
Bilirubin Urine: NEGATIVE
Glucose, UA: NEGATIVE mg/dL
Hgb urine dipstick: NEGATIVE
Ketones, ur: NEGATIVE mg/dL
Nitrite: NEGATIVE
Protein, ur: NEGATIVE mg/dL
Specific Gravity, Urine: 1.018 (ref 1.005–1.030)
pH: 6 (ref 5.0–8.0)

## 2020-10-31 LAB — CBC
HCT: 33.9 % — ABNORMAL LOW (ref 36.0–46.0)
Hemoglobin: 10.3 g/dL — ABNORMAL LOW (ref 12.0–15.0)
MCH: 22.1 pg — ABNORMAL LOW (ref 26.0–34.0)
MCHC: 30.4 g/dL (ref 30.0–36.0)
MCV: 72.6 fL — ABNORMAL LOW (ref 80.0–100.0)
Platelets: 412 10*3/uL — ABNORMAL HIGH (ref 150–400)
RBC: 4.67 MIL/uL (ref 3.87–5.11)
RDW: 19.3 % — ABNORMAL HIGH (ref 11.5–15.5)
WBC: 12.3 10*3/uL — ABNORMAL HIGH (ref 4.0–10.5)
nRBC: 0 % (ref 0.0–0.2)

## 2020-10-31 MED ORDER — LACTATED RINGERS IV BOLUS
1000.0000 mL | Freq: Once | INTRAVENOUS | Status: AC
  Administered 2020-10-31: 1000 mL via INTRAVENOUS

## 2020-10-31 MED ORDER — METOCLOPRAMIDE HCL 5 MG/ML IJ SOLN
10.0000 mg | Freq: Once | INTRAMUSCULAR | Status: AC
  Administered 2020-10-31: 10 mg via INTRAVENOUS
  Filled 2020-10-31: qty 2

## 2020-10-31 MED ORDER — FAMOTIDINE IN NACL 20-0.9 MG/50ML-% IV SOLN
20.0000 mg | Freq: Once | INTRAVENOUS | Status: AC
  Administered 2020-10-31: 20 mg via INTRAVENOUS
  Filled 2020-10-31: qty 50

## 2020-10-31 NOTE — MAU Provider Note (Signed)
Chief Complaint:  Nausea, Emesis, and Constipation   Event Date/Time   First Provider Initiated Contact with Patient 10/31/20 2040     HPI: Samantha Alexander is a 30 y.o. G2P1001 at [redacted]w[redacted]d who presents to maternity admissions reporting nausea for the past 3 weeks. Was on diclegis but that did not work, phenergan makes it impossible for her to work. She has been taking zofran which only barely controls the nausea/vomiting but is also making her constipated. Can keep some food down, can only sip water very infrequently (took her two days to finish an 11oz water bottle). Has vomited 2-3x in the past 24hrs. No bowel movement in 2 days, last one was very small and hard. Has been trying to take oral meds and dulcolax suppositories without relief. Now thinks the constipation may be making her nausea worse. Denies abdominal pain or cramping, vaginal bleeding/discharge or any other recent illness. No other physical complaints.    Pregnancy Course: Receives care at [facility]  Past Medical History:  Diagnosis Date   Abscess of vulva    Anemia    Headache    HPV (human papilloma virus) anogenital infection    Infection    recurrent yeast infections throughout pregnancy   Medical history non-contributory    Pregnancy induced hypertension    OB History  Gravida Para Term Preterm AB Living  2 1 1     1   SAB IAB Ectopic Multiple Live Births        0 1    # Outcome Date GA Lbr Len/2nd Weight Sex Delivery Anes PTL Lv  2 Current           1 Term 03/08/18 [redacted]w[redacted]d 02:35 / 00:30 6 lb 11.8 oz (3.056 kg) F Vag-Spont None  LIV   Past Surgical History:  Procedure Laterality Date   hemorrhoid ligation     WISDOM TOOTH EXTRACTION     History reviewed. No pertinent family history. Social History   Tobacco Use   Smoking status: Never   Smokeless tobacco: Never  Vaping Use   Vaping Use: Never used  Substance Use Topics   Alcohol use: Never   Drug use: Never   Allergies  Allergen Reactions    Fentanyl Itching   Medications Prior to Admission  Medication Sig Dispense Refill Last Dose   labetalol (NORMODYNE) 100 MG tablet Take 100 mg by mouth 2 (two) times daily.   Past Month   ondansetron (ZOFRAN) 4 MG tablet Take 4 mg by mouth 2 (two) times daily.   10/31/2020 at 0800   Prenatal Vit-Fe Fumarate-FA (PREPLUS) 27-1 MG TABS Take by mouth.   10/30/2020   LOSARTAN POTASSIUM PO Take 25 mg by mouth daily.      I have reviewed patient's Past Medical Hx, Surgical Hx, Family Hx, Social Hx, medications and allergies.   ROS:  Review of Systems  Constitutional:  Positive for fatigue. Negative for fever.  HENT:  Negative for congestion and sore throat.   Eyes:  Negative for visual disturbance.  Respiratory:  Negative for cough and shortness of breath.   Gastrointestinal:  Positive for constipation, nausea and vomiting.  Genitourinary:  Negative for vaginal bleeding and vaginal discharge.  Neurological:  Negative for light-headedness and headaches.   Physical Exam  Patient Vitals for the past 24 hrs:  BP Temp Temp src Pulse Resp SpO2 Height Weight  10/31/20 1955 124/68 -- -- 71 15 100 % -- --  10/31/20 1756 127/85 98.7 F (37.1 C) Oral  62 16 100 % -- --  10/31/20 1753 -- -- -- -- -- -- 5\' 8"  (1.727 m) 218 lb 3.2 oz (99 kg)   Constitutional: Well-developed, well-nourished female in no acute distress, but appears fatigued Cardiovascular: normal rate & rhythm, no murmur Respiratory: normal effort, lung sounds clear throughout GI: Abd soft, non-tender, gravid appropriate for gestational age. Pos BS x 4 MS: Extremities nontender, no edema, normal ROM Neurologic: Alert and oriented x 4.  GU: no CVA tenderness Pelvic: exam deferred   Labs: Results for orders placed or performed during the hospital encounter of 10/31/20 (from the past 24 hour(s))  Urinalysis, Routine w reflex microscopic Urine, Clean Catch     Status: Abnormal   Collection Time: 10/31/20  5:22 PM  Result Value Ref  Range   Color, Urine YELLOW YELLOW   APPearance CLEAR CLEAR   Specific Gravity, Urine 1.018 1.005 - 1.030   pH 6.0 5.0 - 8.0   Glucose, UA NEGATIVE NEGATIVE mg/dL   Hgb urine dipstick NEGATIVE NEGATIVE   Bilirubin Urine NEGATIVE NEGATIVE   Ketones, ur NEGATIVE NEGATIVE mg/dL   Protein, ur NEGATIVE NEGATIVE mg/dL   Nitrite NEGATIVE NEGATIVE   Leukocytes,Ua TRACE (A) NEGATIVE   RBC / HPF 0-5 0 - 5 RBC/hpf   WBC, UA 0-5 0 - 5 WBC/hpf   Bacteria, UA RARE (A) NONE SEEN   Squamous Epithelial / LPF 0-5 0 - 5   Mucus PRESENT   CBC     Status: Abnormal   Collection Time: 10/31/20  9:24 PM  Result Value Ref Range   WBC 12.3 (H) 4.0 - 10.5 K/uL   RBC 4.67 3.87 - 5.11 MIL/uL   Hemoglobin 10.3 (L) 12.0 - 15.0 g/dL   HCT 11/02/20 (L) 41.2 - 87.8 %   MCV 72.6 (L) 80.0 - 100.0 fL   MCH 22.1 (L) 26.0 - 34.0 pg   MCHC 30.4 30.0 - 36.0 g/dL   RDW 67.6 (H) 72.0 - 94.7 %   Platelets 412 (H) 150 - 400 K/uL   nRBC 0.0 0.0 - 0.2 %  Comprehensive metabolic panel     Status: Abnormal   Collection Time: 10/31/20  9:24 PM  Result Value Ref Range   Sodium 134 (L) 135 - 145 mmol/L   Potassium 3.7 3.5 - 5.1 mmol/L   Chloride 102 98 - 111 mmol/L   CO2 23 22 - 32 mmol/L   Glucose, Bld 85 70 - 99 mg/dL   BUN 10 6 - 20 mg/dL   Creatinine, Ser 11/02/20 0.44 - 1.00 mg/dL   Calcium 9.5 8.9 - 2.83 mg/dL   Total Protein 7.3 6.5 - 8.1 g/dL   Albumin 3.6 3.5 - 5.0 g/dL   AST 16 15 - 41 U/L   ALT 22 0 - 44 U/L   Alkaline Phosphatase 98 38 - 126 U/L   Total Bilirubin 0.3 0.3 - 1.2 mg/dL   GFR, Estimated 66.2 >94 mL/min   Anion gap 9 5 - 15   Imaging:  No results found.  MAU Course: Orders Placed This Encounter  Procedures   Urinalysis, Routine w reflex microscopic Urine, Clean Catch   CBC   Comprehensive metabolic panel   Soap suds enema   Discharge patient   Meds ordered this encounter  Medications   lactated ringers bolus 1,000 mL   metoCLOPramide (REGLAN) injection 10 mg   famotidine (PEPCID) IVPB 20  mg premix   metoCLOPramide (REGLAN) 10 MG tablet    Sig: Take 1  tablet (10 mg total) by mouth every 6 (six) hours as needed for nausea.    Dispense:  40 tablet    Refill:  2    Order Specific Question:   Supervising Provider    Answer:   Samara Snide   MDM: Pt prefers not to try meds that may make her sleepy. Amenable to rehydration, reglan and pepcid. Soap suds enema ordered. Will see how she tolerates all of the above and PO test.  Only tolerated half the enema before she was able to have a large bowel movement. Fluids and meds provided good relief of nausea. Will send script for reglan and instructions on a bowel regimen to prevent further constipation.  Assessment: 1. Nausea and vomiting during pregnancy   2. Constipation during pregnancy in first trimester    Plan: Discharge home in stable condition with return precautions.     Follow-up Information     Ob/Gyn, [place]. Go to.   Specialty: Obstetrics and Gynecology Why: as scheduled for ongoing prenatal care Contact information: [address] [place] [address] [phone]                 Allergies as of 11/01/2020       Reactions   Fentanyl Itching        Medication List     STOP taking these medications    LOSARTAN POTASSIUM PO       TAKE these medications    labetalol 100 MG tablet Commonly known as: NORMODYNE Take 100 mg by mouth 2 (two) times daily.   metoCLOPramide 10 MG tablet Commonly known as: Reglan Take 1 tablet (10 mg total) by mouth every 6 (six) hours as needed for nausea.   ondansetron 4 MG tablet Commonly known as: ZOFRAN Take 4 mg by mouth 2 (two) times daily.   PrePLUS 27-1 MG Tabs Take by mouth.       Edd Arbour, CNM, MSN, IBCLC Certified Nurse Midwife, [facility]

## 2020-10-31 NOTE — MAU Note (Signed)
Samantha Alexander is a 31 y.o. at [redacted]w[redacted]d here in MAU reporting: nausea for the past 3 weeks. States she has been so sick that she cannot get up. Was Rx zofran and states last dose was around 0800 this morning. This medicine has been making her constipated. Last BM was 2 days ago. 2-3 episodes of emesis in the past 24 hours.  Onset of complaint: ongoing  Pain score: 0/10  Vitals:   10/31/20 1756  BP: 127/85  Pulse: 62  Resp: 16  Temp: 98.7 F (37.1 C)  SpO2: 100%     Lab orders placed from triage: UA

## 2020-11-01 DIAGNOSIS — K5900 Constipation, unspecified: Secondary | ICD-10-CM

## 2020-11-01 DIAGNOSIS — Z3A09 9 weeks gestation of pregnancy: Secondary | ICD-10-CM | POA: Diagnosis not present

## 2020-11-01 DIAGNOSIS — O219 Vomiting of pregnancy, unspecified: Secondary | ICD-10-CM

## 2020-11-01 DIAGNOSIS — O99611 Diseases of the digestive system complicating pregnancy, first trimester: Secondary | ICD-10-CM | POA: Diagnosis not present

## 2020-11-01 MED ORDER — METOCLOPRAMIDE HCL 10 MG PO TABS: 10.0000 mg | ORAL_TABLET | Freq: Four times a day (QID) | ORAL | 2 refills | Status: DC | PRN

## 2020-11-01 NOTE — Discharge Instructions (Signed)

## 2020-11-03 DIAGNOSIS — Z419 Encounter for procedure for purposes other than remedying health state, unspecified: Secondary | ICD-10-CM | POA: Diagnosis not present

## 2020-11-03 DIAGNOSIS — N912 Amenorrhea, unspecified: Secondary | ICD-10-CM | POA: Diagnosis not present

## 2020-11-03 DIAGNOSIS — R030 Elevated blood-pressure reading, without diagnosis of hypertension: Secondary | ICD-10-CM | POA: Diagnosis not present

## 2020-11-03 DIAGNOSIS — Z3A09 9 weeks gestation of pregnancy: Secondary | ICD-10-CM | POA: Diagnosis not present

## 2020-11-03 DIAGNOSIS — O3680X9 Pregnancy with inconclusive fetal viability, other fetus: Secondary | ICD-10-CM | POA: Diagnosis not present

## 2020-11-03 DIAGNOSIS — N925 Other specified irregular menstruation: Secondary | ICD-10-CM | POA: Diagnosis not present

## 2020-11-03 DIAGNOSIS — Z113 Encounter for screening for infections with a predominantly sexual mode of transmission: Secondary | ICD-10-CM | POA: Diagnosis not present

## 2020-11-03 DIAGNOSIS — Z331 Pregnant state, incidental: Secondary | ICD-10-CM | POA: Diagnosis not present

## 2020-11-03 LAB — OB RESULTS CONSOLE RPR: RPR: NONREACTIVE

## 2020-11-03 LAB — OB RESULTS CONSOLE ANTIBODY SCREEN: Antibody Screen: NEGATIVE

## 2020-11-03 LAB — OB RESULTS CONSOLE HEPATITIS B SURFACE ANTIGEN: Hepatitis B Surface Ag: NEGATIVE

## 2020-11-03 LAB — OB RESULTS CONSOLE RUBELLA ANTIBODY, IGM: Rubella: IMMUNE

## 2020-11-03 LAB — OB RESULTS CONSOLE GC/CHLAMYDIA
Chlamydia: NEGATIVE
Gonorrhea: NEGATIVE

## 2020-11-03 LAB — OB RESULTS CONSOLE ABO/RH: RH Type: POSITIVE

## 2020-11-03 LAB — OB RESULTS CONSOLE HIV ANTIBODY (ROUTINE TESTING): HIV: NONREACTIVE

## 2020-11-03 LAB — HEPATITIS C ANTIBODY: HCV Ab: NEGATIVE

## 2020-11-08 ENCOUNTER — Encounter (HOSPITAL_COMMUNITY): Payer: Self-pay | Admitting: Obstetrics and Gynecology

## 2020-11-08 ENCOUNTER — Inpatient Hospital Stay (HOSPITAL_COMMUNITY)
Admission: AD | Admit: 2020-11-08 | Discharge: 2020-11-08 | Disposition: A | Discharge status: Home / Self Care | Payer: Medicaid Other | Attending: Obstetrics and Gynecology | Admitting: Obstetrics and Gynecology

## 2020-11-08 ENCOUNTER — Other Ambulatory Visit: Payer: Self-pay

## 2020-11-08 DIAGNOSIS — Z79899 Other long term (current) drug therapy: Secondary | ICD-10-CM | POA: Insufficient documentation

## 2020-11-08 DIAGNOSIS — O219 Vomiting of pregnancy, unspecified: Secondary | ICD-10-CM | POA: Diagnosis not present

## 2020-11-08 DIAGNOSIS — O30001 Twin pregnancy, unspecified number of placenta and unspecified number of amniotic sacs, first trimester: Secondary | ICD-10-CM

## 2020-11-08 DIAGNOSIS — Z3A10 10 weeks gestation of pregnancy: Secondary | ICD-10-CM | POA: Diagnosis not present

## 2020-11-08 DIAGNOSIS — O30041 Twin pregnancy, dichorionic/diamniotic, first trimester: Secondary | ICD-10-CM | POA: Insufficient documentation

## 2020-11-08 DIAGNOSIS — O26891 Other specified pregnancy related conditions, first trimester: Secondary | ICD-10-CM | POA: Insufficient documentation

## 2020-11-08 LAB — URINALYSIS, ROUTINE W REFLEX MICROSCOPIC
Bilirubin Urine: NEGATIVE
Glucose, UA: NEGATIVE mg/dL
Hgb urine dipstick: NEGATIVE
Ketones, ur: NEGATIVE mg/dL
Leukocytes,Ua: NEGATIVE
Nitrite: NEGATIVE
Protein, ur: NEGATIVE mg/dL
Specific Gravity, Urine: 1.02 (ref 1.005–1.030)
pH: 6 (ref 5.0–8.0)

## 2020-11-08 MED ORDER — ACETAMINOPHEN 500 MG PO TABS
1000.0000 mg | ORAL_TABLET | Freq: Once | ORAL | Status: AC
  Administered 2020-11-08: 1000 mg via ORAL
  Filled 2020-11-08: qty 2

## 2020-11-08 MED ORDER — LACTATED RINGERS IV BOLUS
1000.0000 mL | Freq: Once | INTRAVENOUS | Status: AC
  Administered 2020-11-08: 1000 mL via INTRAVENOUS

## 2020-11-08 MED ORDER — IBUPROFEN 600 MG PO TABS
600.0000 mg | ORAL_TABLET | Freq: Once | ORAL | Status: AC
  Administered 2020-11-08: 600 mg via ORAL
  Filled 2020-11-08: qty 1

## 2020-11-08 MED ORDER — FAMOTIDINE IN NACL 20-0.9 MG/50ML-% IV SOLN
20.0000 mg | Freq: Once | INTRAVENOUS | Status: AC
  Administered 2020-11-08: 20 mg via INTRAVENOUS
  Filled 2020-11-08: qty 50

## 2020-11-08 NOTE — MAU Provider Note (Signed)
History     CSN: [id number]  Arrival date and time: 11/08/20 1627   Event Date/Time   First Provider Initiated Contact with Patient 11/08/20 1704      Chief Complaint  Patient presents with   Abdominal Pain   Emesis   Nausea   HPI  [name] is a 30 y.o. female G2P1001 @ [redacted]w[redacted]d with di/di twins here with lower abdominal pain that started last night while vomiting. She reports worsening pain with movement. With improvement in pain with rest. The pain is in the lower part of her abdomen and at times in the LLQ. The pain comes and goes. She has not tried anything for the pain. No bleeding.  She denies nausea and vomiting currently.   OB History     Gravida  2   Para  1   Term  1   Preterm      AB      Living  1      SAB      IAB      Ectopic      Multiple  0   Live Births  1           Past Medical History:  Diagnosis Date   Abscess of vulva    Anemia    Headache    HPV (human papilloma virus) anogenital infection    Infection    recurrent yeast infections throughout pregnancy   Medical history non-contributory    Pregnancy induced hypertension     Past Surgical History:  Procedure Laterality Date   hemorrhoid ligation     WISDOM TOOTH EXTRACTION      History reviewed. No pertinent family history.  Social History   Tobacco Use   Smoking status: Never   Smokeless tobacco: Never  Vaping Use   Vaping Use: Never used  Substance Use Topics   Alcohol use: Never   Drug use: Never    Allergies:  Allergies  Allergen Reactions   Fentanyl Itching    Medications Prior to Admission  Medication Sig Dispense Refill Last Dose   labetalol (NORMODYNE) 100 MG tablet Take 100 mg by mouth 2 (two) times daily.   Past Week   metoCLOPramide (REGLAN) 10 MG tablet Take 1 tablet (10 mg total) by mouth every 6 (six) hours as needed for nausea. 40 tablet 2 11/08/2020   ondansetron (ZOFRAN) 4 MG tablet Take 4 mg by mouth 2 (two) times daily.    11/08/2020   Prenatal Vit-Fe Fumarate-FA (PREPLUS) 27-1 MG TABS Take by mouth.   11/07/2020   Results for orders placed or performed during the hospital encounter of 11/08/20 (from the past 72 hour(s))  Urinalysis, Routine w reflex microscopic Urine, Clean Catch     Status: Abnormal   Collection Time: 11/08/20  4:48 PM  Result Value Ref Range   Color, Urine YELLOW YELLOW   APPearance HAZY (A) CLEAR   Specific Gravity, Urine 1.020 1.005 - 1.030   pH 6.0 5.0 - 8.0   Glucose, UA NEGATIVE NEGATIVE mg/dL   Hgb urine dipstick NEGATIVE NEGATIVE   Bilirubin Urine NEGATIVE NEGATIVE   Ketones, ur NEGATIVE NEGATIVE mg/dL   Protein, ur NEGATIVE NEGATIVE mg/dL   Nitrite NEGATIVE NEGATIVE   Leukocytes,Ua NEGATIVE NEGATIVE    Comment: Performed at [facility], [address], [place], [place] [address]    Review of Systems  Constitutional:  Negative for fever.  Gastrointestinal:  Positive for abdominal pain.  Genitourinary:  Negative for  dysuria and flank pain.  Physical Exam   Blood pressure 136/75, pulse (!) 105, temperature 98.5 F (36.9 C), temperature source Oral, resp. rate 20, height 5\' 7"  (1.702 m), weight 98 kg, last menstrual period 08/28/2020, SpO2 98 %, unknown if currently breastfeeding.  Physical Exam Constitutional:      General: She is not in acute distress.    Appearance: She is well-developed. She is not ill-appearing or toxic-appearing.  HENT:     Head: Normocephalic.  Abdominal:     Tenderness: There is generalized abdominal tenderness and tenderness in the right lower quadrant, suprapubic area and left lower quadrant.  Skin:    General: Skin is warm.  Neurological:     Mental Status: She is alert and oriented to person, place, and time.  Psychiatric:        Mood and Affect: Mood normal.   MAU Course  Procedures Pt informed that the ultrasound is considered a limited OB ultrasound and is not intended to be a complete ultrasound exam.  Patient also informed that  the ultrasound is not being completed with the intent of assessing for fetal or placental anomalies or any pelvic abnormalities.  Explained that the purpose of today's ultrasound is to assess for  viability; twin gestation with fetal heartbeat x 2.   Patient acknowledges the purpose of the exam and the limitations of the study.     MDM  Cervix long and closed, Ibuprofen given 600 mg. Some relief.  Lr bolus & pepcid given She reports she feels well enough to go home. Likely round ligament pain given twin gestation vs. Mulled muscles in abdomen 2/2 vomiting.   Assessment and Plan   A:  1. Nausea and vomiting in pregnancy   2. [redacted] weeks gestation of pregnancy   3. Twin gestation in first trimester, unspecified multiple gestation type      P:  Discharge home in stable condition Small, frequent meals Continue Zofran as needed Recommended pregnancy support belt Return to MAU if symptoms worsen  Finola Rosal, 08/30/2020, NP 11/09/2020 1:21 PM

## 2020-11-08 NOTE — MAU Note (Signed)
Presents with c/o lower abdominal pain that began last night.  Denies VB.  Also reports N/V that began last night, but none since 1400 this afternoon.  Has kept food and fluids down since 1400 this afternoon.

## 2020-11-17 DIAGNOSIS — O139 Gestational [pregnancy-induced] hypertension without significant proteinuria, unspecified trimester: Secondary | ICD-10-CM | POA: Diagnosis not present

## 2020-11-17 DIAGNOSIS — R110 Nausea: Secondary | ICD-10-CM | POA: Diagnosis not present

## 2020-11-17 DIAGNOSIS — Z3402 Encounter for supervision of normal first pregnancy, second trimester: Secondary | ICD-10-CM | POA: Diagnosis not present

## 2020-11-17 DIAGNOSIS — O30009 Twin pregnancy, unspecified number of placenta and unspecified number of amniotic sacs, unspecified trimester: Secondary | ICD-10-CM | POA: Diagnosis not present

## 2020-11-17 DIAGNOSIS — K219 Gastro-esophageal reflux disease without esophagitis: Secondary | ICD-10-CM | POA: Diagnosis not present

## 2020-12-03 DIAGNOSIS — Z419 Encounter for procedure for purposes other than remedying health state, unspecified: Secondary | ICD-10-CM | POA: Diagnosis not present

## 2020-12-20 ENCOUNTER — Other Ambulatory Visit: Payer: Self-pay

## 2020-12-20 ENCOUNTER — Inpatient Hospital Stay (HOSPITAL_COMMUNITY)
Admission: AD | Admit: 2020-12-20 | Discharge: 2020-12-20 | Disposition: A | Discharge status: Home / Self Care | Payer: Medicaid Other | Attending: Obstetrics & Gynecology | Admitting: Obstetrics & Gynecology

## 2020-12-20 ENCOUNTER — Encounter (HOSPITAL_COMMUNITY): Payer: Self-pay | Admitting: Obstetrics & Gynecology

## 2020-12-20 DIAGNOSIS — O98812 Other maternal infectious and parasitic diseases complicating pregnancy, second trimester: Secondary | ICD-10-CM | POA: Diagnosis not present

## 2020-12-20 DIAGNOSIS — O26892 Other specified pregnancy related conditions, second trimester: Secondary | ICD-10-CM | POA: Diagnosis not present

## 2020-12-20 DIAGNOSIS — R109 Unspecified abdominal pain: Secondary | ICD-10-CM | POA: Insufficient documentation

## 2020-12-20 DIAGNOSIS — O30042 Twin pregnancy, dichorionic/diamniotic, second trimester: Secondary | ICD-10-CM | POA: Diagnosis not present

## 2020-12-20 DIAGNOSIS — Z3A16 16 weeks gestation of pregnancy: Secondary | ICD-10-CM | POA: Diagnosis not present

## 2020-12-20 DIAGNOSIS — Z885 Allergy status to narcotic agent status: Secondary | ICD-10-CM | POA: Insufficient documentation

## 2020-12-20 DIAGNOSIS — B3731 Acute candidiasis of vulva and vagina: Secondary | ICD-10-CM | POA: Insufficient documentation

## 2020-12-20 LAB — WET PREP, GENITAL
Clue Cells Wet Prep HPF POC: NONE SEEN
Sperm: NONE SEEN
Trich, Wet Prep: NONE SEEN
Yeast Wet Prep HPF POC: NONE SEEN

## 2020-12-20 LAB — URINALYSIS, ROUTINE W REFLEX MICROSCOPIC
Bilirubin Urine: NEGATIVE
Glucose, UA: NEGATIVE mg/dL
Hgb urine dipstick: NEGATIVE
Ketones, ur: 20 mg/dL — AB
Leukocytes,Ua: NEGATIVE
Nitrite: NEGATIVE
Protein, ur: NEGATIVE mg/dL
Specific Gravity, Urine: 1.021 (ref 1.005–1.030)
pH: 5 (ref 5.0–8.0)

## 2020-12-20 MED ORDER — TERCONAZOLE 0.4 % VA CREA: 1.0000 | TOPICAL_CREAM | Freq: Every day | VAGINAL | 0 refills | Status: DC

## 2020-12-20 NOTE — MAU Provider Note (Signed)
History     CSN: [id number]  Arrival date and time: 12/20/20 1753   Event Date/Time   First Provider Initiated Contact with Patient 12/20/20 1837      Chief Complaint  Patient presents with   Vaginal Bleeding   Vaginal Discharge   Abdominal Pain   HPI Samantha Alexander is a 30 y.o. G2P1001 at [redacted]w[redacted]d with di/di twins who presents with abdominal cramping, vaginal bleeding, & vaginal discharge. Symptoms started this morning. Reports pink spotting on toilet paper that has resolved. Noticed lower abdominal cramping started after she saw the blood. Rates pain 7/10. Hasn't treated symptoms. Noticed green thick discharge today. Also complains of vulva feeling raw & sore. No recent intercourse.   OB History     Gravida  2   Para  1   Term  1   Preterm      AB      Living  1      SAB      IAB      Ectopic      Multiple  0   Live Births  1           Past Medical History:  Diagnosis Date   Abscess of vulva    Anemia    Headache    HPV (human papilloma virus) anogenital infection    Pregnancy induced hypertension     Past Surgical History:  Procedure Laterality Date   hemorrhoid ligation     WISDOM TOOTH EXTRACTION      Family History  Problem Relation Age of Onset   Hypertension Mother    Diabetes Mother    Hypertension Father    Diabetes Father    Diabetes Sister    Lupus Sister     Social History   Tobacco Use   Smoking status: Never   Smokeless tobacco: Never  Vaping Use   Vaping Use: Never used  Substance Use Topics   Alcohol use: Never   Drug use: Never    Allergies:  Allergies  Allergen Reactions   Fentanyl Itching    Medications Prior to Admission  Medication Sig Dispense Refill Last Dose   BABY ASPIRIN PO Take 81 mg by mouth daily.      cholecalciferol (VITAMIN D3) 25 MCG (1000 UNIT) tablet Take 1,000 Units by mouth daily.      Doxylamine-Pyridoxine (DICLEGIS) 10-10 MG TBEC Take by mouth.   12/19/2020   omeprazole  (PRILOSEC) 10 MG capsule Take 10 mg by mouth daily.      promethazine (PHENERGAN) 25 MG tablet Take 25 mg by mouth every 6 (six) hours as needed for nausea or vomiting.      labetalol (NORMODYNE) 100 MG tablet Take 100 mg by mouth 2 (two) times daily.      metoCLOPramide (REGLAN) 10 MG tablet Take 1 tablet (10 mg total) by mouth every 6 (six) hours as needed for nausea. 40 tablet 2    ondansetron (ZOFRAN) 4 MG tablet Take 4 mg by mouth 2 (two) times daily.      Prenatal Vit-Fe Fumarate-FA (PREPLUS) 27-1 MG TABS Take by mouth.       Review of Systems  Constitutional: Negative.   Gastrointestinal:  Positive for abdominal pain, nausea and vomiting. Negative for diarrhea.  Genitourinary:  Positive for vaginal bleeding, vaginal discharge and vaginal pain. Negative for dysuria, genital sores and hematuria.  Physical Exam   Blood pressure 129/72, pulse (!) 102, temperature 98.4 F (36.9 C), temperature source Oral, resp. rate  18, height 5\' 8"  (1.727 m), weight 97.9 kg, last menstrual period 08/28/2020, SpO2 99 %, unknown if currently breastfeeding.  Physical Exam Vitals and nursing note reviewed. Exam conducted with a chaperone present.  Constitutional:      Appearance: She is well-developed. She is not ill-appearing.  HENT:     Head: Normocephalic and atraumatic.  Eyes:     General: No scleral icterus. Pulmonary:     Effort: Pulmonary effort is normal. No respiratory distress.  Abdominal:     General: Abdomen is flat.     Palpations: Abdomen is soft.     Tenderness: There is no abdominal tenderness.  Genitourinary:    Vagina: Vaginal discharge and erythema present. No bleeding.     Cervix: Normal.     Comments: Vulva & vagina erythematous. Moderate amount of green tinged clumpy discharge. No blood. Cervix closed/thick Skin:    General: Skin is warm and dry.  Neurological:     General: No focal deficit present.     Mental Status: She is alert.  Psychiatric:        Mood and Affect:  Mood normal.        Behavior: Behavior normal.    Pt informed that the ultrasound is considered a limited OB ultrasound and is not intended to be a complete ultrasound exam.  Patient also informed that the ultrasound is not being completed with the intent of assessing for fetal or placental anomalies or any pelvic abnormalities.  Explained that the purpose of today's ultrasound is to assess for  viability.  Patient acknowledges the purpose of the exam and the limitations of the study.   Di/di twins, active, cardiac activity present  MAU Course  Procedures Results for orders placed or performed during the hospital encounter of 12/20/20 (from the past 24 hour(s))  Wet prep, genital     Status: Abnormal   Collection Time: 12/20/20  6:56 PM   Specimen: Vaginal  Result Value Ref Range   Yeast Wet Prep HPF POC NONE SEEN NONE SEEN   Trich, Wet Prep NONE SEEN NONE SEEN   Clue Cells Wet Prep HPF POC NONE SEEN NONE SEEN   WBC, Wet Prep HPF POC MANY (A) NONE SEEN   Sperm NONE SEEN     MDM Vaginal discharge on exam is consistent with yeast infection - will rx terazol. Wet prep & GC/CT collected.   No blood seen on exam. Likely related to yeast infection. She is RH positive.   Cervix closed/thick. Nurse thinks she heard 2 separate babies on doppler but requesting ultrasound to be sure. See documentation for BSUS  No urinary complaints. U/a backed up in lab - will d/c patient and contact as needed depending on results. Ob urine culture added as well.  Assessment and Plan   1. Vaginal yeast infection   2. Dichorionic diamniotic twin pregnancy in second trimester   3. [redacted] weeks gestation of pregnancy   -Rx terazol -urine culture pending -reviewed reasons to return to MAU   12/22/20 12/20/2020, 7:53 PM

## 2020-12-20 NOTE — MAU Note (Signed)
Having spotting and pain, started around noon. Ongoing nausea. Having some d/c- "very discolored, with some yellow and green".  Denies recent intercourse

## 2020-12-21 LAB — GC/CHLAMYDIA PROBE AMP (~~LOC~~) NOT AT ARMC
Chlamydia: NEGATIVE
Comment: NEGATIVE
Comment: NORMAL
Neisseria Gonorrhea: NEGATIVE

## 2020-12-21 LAB — CULTURE, OB URINE
Culture: NO GROWTH
Special Requests: NORMAL

## 2021-01-03 ENCOUNTER — Encounter (HOSPITAL_COMMUNITY): Payer: Self-pay | Admitting: Obstetrics & Gynecology

## 2021-01-03 ENCOUNTER — Other Ambulatory Visit: Payer: Self-pay

## 2021-01-03 ENCOUNTER — Inpatient Hospital Stay (HOSPITAL_COMMUNITY)
Admission: AD | Admit: 2021-01-03 | Discharge: 2021-01-03 | Disposition: A | Discharge status: Home / Self Care | Payer: Medicaid Other | Attending: Obstetrics & Gynecology | Admitting: Obstetrics & Gynecology

## 2021-01-03 DIAGNOSIS — R42 Dizziness and giddiness: Secondary | ICD-10-CM | POA: Diagnosis not present

## 2021-01-03 DIAGNOSIS — O99019 Anemia complicating pregnancy, unspecified trimester: Secondary | ICD-10-CM

## 2021-01-03 DIAGNOSIS — O99012 Anemia complicating pregnancy, second trimester: Secondary | ICD-10-CM

## 2021-01-03 DIAGNOSIS — D649 Anemia, unspecified: Secondary | ICD-10-CM

## 2021-01-03 DIAGNOSIS — Z3A18 18 weeks gestation of pregnancy: Secondary | ICD-10-CM | POA: Diagnosis not present

## 2021-01-03 DIAGNOSIS — O26892 Other specified pregnancy related conditions, second trimester: Secondary | ICD-10-CM | POA: Diagnosis not present

## 2021-01-03 DIAGNOSIS — Z419 Encounter for procedure for purposes other than remedying health state, unspecified: Secondary | ICD-10-CM | POA: Diagnosis not present

## 2021-01-03 LAB — URINALYSIS, ROUTINE W REFLEX MICROSCOPIC
Bilirubin Urine: NEGATIVE
Glucose, UA: NEGATIVE mg/dL
Hgb urine dipstick: NEGATIVE
Ketones, ur: 5 mg/dL — AB
Leukocytes,Ua: NEGATIVE
Nitrite: NEGATIVE
Protein, ur: NEGATIVE mg/dL
Specific Gravity, Urine: 1.025 (ref 1.005–1.030)
pH: 5 (ref 5.0–8.0)

## 2021-01-03 LAB — CBC
HCT: 27.1 % — ABNORMAL LOW (ref 36.0–46.0)
Hemoglobin: 8.3 g/dL — ABNORMAL LOW (ref 12.0–15.0)
MCH: 22 pg — ABNORMAL LOW (ref 26.0–34.0)
MCHC: 30.6 g/dL (ref 30.0–36.0)
MCV: 71.9 fL — ABNORMAL LOW (ref 80.0–100.0)
Platelets: 363 10*3/uL (ref 150–400)
RBC: 3.77 MIL/uL — ABNORMAL LOW (ref 3.87–5.11)
RDW: 16.9 % — ABNORMAL HIGH (ref 11.5–15.5)
WBC: 12 10*3/uL — ABNORMAL HIGH (ref 4.0–10.5)
nRBC: 0 % (ref 0.0–0.2)

## 2021-01-03 MED ORDER — SODIUM CHLORIDE 0.9 % IV SOLN: INTRAVENOUS | Status: DC

## 2021-01-03 MED ORDER — SCOPOLAMINE 1 MG/3DAYS TD PT72
1.0000 | MEDICATED_PATCH | TRANSDERMAL | Status: DC
  Administered 2021-01-03: 1.5 mg via TRANSDERMAL
  Filled 2021-01-03: qty 1

## 2021-01-03 MED ORDER — PROCHLORPERAZINE MALEATE 10 MG PO TABS
10.0000 mg | ORAL_TABLET | Freq: Four times a day (QID) | ORAL | Status: DC | PRN
  Administered 2021-01-03: 10 mg via ORAL
  Filled 2021-01-03 (×2): qty 1

## 2021-01-03 MED ORDER — SCOPOLAMINE 1 MG/3DAYS TD PT72: 1.0000 | MEDICATED_PATCH | TRANSDERMAL | 12 refills | Status: DC

## 2021-01-03 MED ORDER — PROCHLORPERAZINE MALEATE 10 MG PO TABS: 10.0000 mg | ORAL_TABLET | Freq: Four times a day (QID) | ORAL | 0 refills | Status: DC | PRN

## 2021-01-03 MED ORDER — SODIUM CHLORIDE 0.9 % IV SOLN
510.0000 mg | Freq: Once | INTRAVENOUS | Status: AC
  Administered 2021-01-03: 510 mg via INTRAVENOUS
  Filled 2021-01-03: qty 17

## 2021-01-03 NOTE — Progress Notes (Signed)
Patient declined new IV for infusion. Provider made aware.

## 2021-01-03 NOTE — MAU Note (Signed)
Presents c/o dizziness.  States her heart rate is increased and oxygen levels are dropping, reports using Apple watch to monitor.  Also reports nausea, took Diclegis this morning.  Reports not actively vomiting. Denies VB or LOF.

## 2021-01-03 NOTE — Discharge Instructions (Signed)

## 2021-01-03 NOTE — MAU Provider Note (Signed)
History     CSN: [id number]  Arrival date and time: 01/03/21 5284   Event Date/Time   First Provider Initiated Contact with Patient 01/03/21 1054      Chief Complaint  Patient presents with   Dizziness   HPI Samantha Alexander is a 30 y.o. G2P1001 at [redacted]w[redacted]d who presents with dizziness. She states she was at work and felt dizzy and lightheaded while she was walking around. She reports that she hasn't drank any water but did eat breakfast. She reports she is struggling with nausea and vomiting so she isn't eating much. She states she take diclegis with no relief and phenergan with no relief. She denies any bleeding or leaking. Reports feeling movement.   OB History     Gravida  2   Para  1   Term  1   Preterm      AB      Living  1      SAB      IAB      Ectopic      Multiple  0   Live Births  1           Past Medical History:  Diagnosis Date   Abscess of vulva    Anemia    Headache    HPV (human papilloma virus) anogenital infection    Pregnancy induced hypertension     Past Surgical History:  Procedure Laterality Date   hemorrhoid ligation     WISDOM TOOTH EXTRACTION      Family History  Problem Relation Age of Onset   Hypertension Mother    Diabetes Mother    Hypertension Father    Diabetes Father    Diabetes Sister    Lupus Sister     Social History   Tobacco Use   Smoking status: Never   Smokeless tobacco: Never  Vaping Use   Vaping Use: Never used  Substance Use Topics   Alcohol use: Never   Drug use: Never    Allergies:  Allergies  Allergen Reactions   Fentanyl Itching    Medications Prior to Admission  Medication Sig Dispense Refill Last Dose   BABY ASPIRIN PO Take 81 mg by mouth daily.   01/03/2021   cholecalciferol (VITAMIN D3) 25 MCG (1000 UNIT) tablet Take 1,000 Units by mouth daily.   01/02/2021   Doxylamine-Pyridoxine 10-10 MG TBEC Take by mouth.   01/03/2021   labetalol (NORMODYNE) 100 MG tablet Take 100 mg by  mouth 2 (two) times daily.   01/03/2021   Prenatal Vit-Fe Fumarate-FA (PREPLUS) 27-1 MG TABS Take by mouth.   01/02/2021   omeprazole (PRILOSEC) 10 MG capsule Take 10 mg by mouth daily.      ondansetron (ZOFRAN) 4 MG tablet Take 4 mg by mouth 2 (two) times daily.      promethazine (PHENERGAN) 25 MG tablet Take 25 mg by mouth every 6 (six) hours as needed for nausea or vomiting.      terconazole (TERAZOL 7) 0.4 % vaginal cream Place 1 applicator vaginally at bedtime. Use for seven days 45 g 0     Review of Systems  Constitutional: Negative.  Negative for fatigue and fever.  HENT: Negative.    Respiratory: Negative.  Negative for shortness of breath.   Cardiovascular: Negative.  Negative for chest pain.  Gastrointestinal:  Positive for nausea and vomiting. Negative for abdominal pain, constipation and diarrhea.  Genitourinary: Negative.  Negative for dysuria, vaginal bleeding and vaginal discharge.  Neurological:  Positive for dizziness and light-headedness. Negative for headaches.  Physical Exam   Blood pressure (!) 91/55, pulse (!) 103, temperature 98.3 F (36.8 C), temperature source Oral, resp. rate 18, height 5\' 8"  (1.727 m), weight 101.5 kg, last menstrual period 08/28/2020, SpO2 100 %, unknown if currently breastfeeding.  Physical Exam Vitals and nursing note reviewed.  Constitutional:      General: She is not in acute distress.    Appearance: She is well-developed.  HENT:     Head: Normocephalic.  Eyes:     Pupils: Pupils are equal, round, and reactive to light.  Cardiovascular:     Rate and Rhythm: Normal rate and regular rhythm.     Heart sounds: Normal heart sounds.  Pulmonary:     Effort: Pulmonary effort is normal. No respiratory distress.     Breath sounds: Normal breath sounds.  Abdominal:     General: Bowel sounds are normal. There is no distension.     Palpations: Abdomen is soft.     Tenderness: There is no abdominal tenderness.  Skin:    General: Skin is warm  and dry.  Neurological:     Mental Status: She is alert and oriented to person, place, and time.  Psychiatric:        Mood and Affect: Mood normal.        Behavior: Behavior normal.        Thought Content: Thought content normal.        Judgment: Judgment normal.    MAU Course  Procedures Results for orders placed or performed during the hospital encounter of 01/03/21 (from the past 24 hour(s))  Urinalysis, Routine w reflex microscopic Urine, Clean Catch     Status: Abnormal   Collection Time: 01/03/21 10:26 AM  Result Value Ref Range   Color, Urine AMBER (A) YELLOW   APPearance HAZY (A) CLEAR   Specific Gravity, Urine 1.025 1.005 - 1.030   pH 5.0 5.0 - 8.0   Glucose, UA NEGATIVE NEGATIVE mg/dL   Hgb urine dipstick NEGATIVE NEGATIVE   Bilirubin Urine NEGATIVE NEGATIVE   Ketones, ur 5 (A) NEGATIVE mg/dL   Protein, ur NEGATIVE NEGATIVE mg/dL   Nitrite NEGATIVE NEGATIVE   Leukocytes,Ua NEGATIVE NEGATIVE  CBC     Status: Abnormal   Collection Time: 01/03/21 10:49 AM  Result Value Ref Range   WBC 12.0 (H) 4.0 - 10.5 K/uL   RBC 3.77 (L) 3.87 - 5.11 MIL/uL   Hemoglobin 8.3 (L) 12.0 - 15.0 g/dL   HCT 13/01/22 (L) 76.1 - 95.0 %   MCV 71.9 (L) 80.0 - 100.0 fL   MCH 22.0 (L) 26.0 - 34.0 pg   MCHC 30.6 30.0 - 36.0 g/dL   RDW 93.2 (H) 67.1 - 24.5 %   Platelets 363 150 - 400 K/uL   nRBC 0.0 0.0 - 0.2 %    MDM UA  CBC Scopolamine patch Compazine PO  Recommended IV iron replacement in MAU. Patient initially agreeable to plan of care but refused after unsuccessful IV attempts. Discussed PO iron at length.   Assessment and Plan   1. Anemia during pregnancy   2. [redacted] weeks gestation of pregnancy   3. Dizziness    -Discharge home in stable condition -Rx for scopolamine patches and compazine sent to patient's pharmacy -Second trimester precautions discussed -Patient advised to follow-up with OB as scheduled for prenatal care -Patient may return to MAU as needed or if her condition  were to change or worsen  Rolm Bookbinder CNM 01/03/2021, 10:54 AM

## 2021-01-06 ENCOUNTER — Other Ambulatory Visit: Payer: Self-pay | Admitting: Student

## 2021-01-19 DIAGNOSIS — R030 Elevated blood-pressure reading, without diagnosis of hypertension: Secondary | ICD-10-CM | POA: Diagnosis not present

## 2021-01-19 DIAGNOSIS — Z0373 Encounter for suspected fetal anomaly ruled out: Secondary | ICD-10-CM | POA: Diagnosis not present

## 2021-01-19 DIAGNOSIS — Z3493 Encounter for supervision of normal pregnancy, unspecified, third trimester: Secondary | ICD-10-CM | POA: Diagnosis not present

## 2021-01-19 DIAGNOSIS — Z3A20 20 weeks gestation of pregnancy: Secondary | ICD-10-CM | POA: Diagnosis not present

## 2021-01-24 ENCOUNTER — Telehealth: Payer: Self-pay | Admitting: Hematology

## 2021-01-24 NOTE — Telephone Encounter (Signed)
Scheduled appt per 11/21 referral. Pt is aware of appt date and time.  

## 2021-02-01 ENCOUNTER — Other Ambulatory Visit: Payer: Self-pay

## 2021-02-01 DIAGNOSIS — O9081 Anemia of the puerperium: Secondary | ICD-10-CM

## 2021-02-02 ENCOUNTER — Other Ambulatory Visit: Payer: Self-pay

## 2021-02-02 ENCOUNTER — Inpatient Hospital Stay: Payer: Medicaid Other

## 2021-02-02 ENCOUNTER — Inpatient Hospital Stay: Payer: Medicaid Other | Attending: Hematology | Admitting: Hematology

## 2021-02-02 VITALS — BP 131/79 | HR 106 | Temp 97.2°F | Resp 20 | Wt 229.5 lb

## 2021-02-02 DIAGNOSIS — O9081 Anemia of the puerperium: Secondary | ICD-10-CM

## 2021-02-02 DIAGNOSIS — O99012 Anemia complicating pregnancy, second trimester: Secondary | ICD-10-CM | POA: Insufficient documentation

## 2021-02-02 DIAGNOSIS — Z3A23 23 weeks gestation of pregnancy: Secondary | ICD-10-CM | POA: Diagnosis not present

## 2021-02-02 DIAGNOSIS — D500 Iron deficiency anemia secondary to blood loss (chronic): Secondary | ICD-10-CM | POA: Insufficient documentation

## 2021-02-02 DIAGNOSIS — D509 Iron deficiency anemia, unspecified: Secondary | ICD-10-CM | POA: Diagnosis not present

## 2021-02-02 DIAGNOSIS — Z419 Encounter for procedure for purposes other than remedying health state, unspecified: Secondary | ICD-10-CM | POA: Diagnosis not present

## 2021-02-02 LAB — CBC WITH DIFFERENTIAL (CANCER CENTER ONLY)
Abs Immature Granulocytes: 0.05 10*3/uL (ref 0.00–0.07)
Basophils Absolute: 0 10*3/uL (ref 0.0–0.1)
Basophils Relative: 0 %
Eosinophils Absolute: 0.1 10*3/uL (ref 0.0–0.5)
Eosinophils Relative: 1 %
HCT: 27.4 % — ABNORMAL LOW (ref 36.0–46.0)
Hemoglobin: 8.3 g/dL — ABNORMAL LOW (ref 12.0–15.0)
Immature Granulocytes: 1 %
Lymphocytes Relative: 16 %
Lymphs Abs: 1.7 10*3/uL (ref 0.7–4.0)
MCH: 21.4 pg — ABNORMAL LOW (ref 26.0–34.0)
MCHC: 30.3 g/dL (ref 30.0–36.0)
MCV: 70.8 fL — ABNORMAL LOW (ref 80.0–100.0)
Monocytes Absolute: 0.9 10*3/uL (ref 0.1–1.0)
Monocytes Relative: 8 %
Neutro Abs: 8 10*3/uL — ABNORMAL HIGH (ref 1.7–7.7)
Neutrophils Relative %: 74 %
Platelet Count: 352 10*3/uL (ref 150–400)
RBC: 3.87 MIL/uL (ref 3.87–5.11)
RDW: 16.5 % — ABNORMAL HIGH (ref 11.5–15.5)
WBC Count: 10.6 10*3/uL — ABNORMAL HIGH (ref 4.0–10.5)
nRBC: 0 % (ref 0.0–0.2)

## 2021-02-02 LAB — CMP (CANCER CENTER ONLY)
ALT: 74 U/L — ABNORMAL HIGH (ref 0–44)
AST: 36 U/L (ref 15–41)
Albumin: 2.9 g/dL — ABNORMAL LOW (ref 3.5–5.0)
Alkaline Phosphatase: 203 U/L — ABNORMAL HIGH (ref 38–126)
Anion gap: 9 (ref 5–15)
BUN: 8 mg/dL (ref 6–20)
CO2: 22 mmol/L (ref 22–32)
Calcium: 8.8 mg/dL — ABNORMAL LOW (ref 8.9–10.3)
Chloride: 106 mmol/L (ref 98–111)
Creatinine: 0.7 mg/dL (ref 0.44–1.00)
GFR, Estimated: >60 mL/min (ref >60)
Glucose, Bld: 85 mg/dL (ref 70–99)
Potassium: 3.7 mmol/L (ref 3.5–5.1)
Sodium: 137 mmol/L (ref 135–145)
Total Bilirubin: 0.3 mg/dL (ref 0.3–1.2)
Total Protein: 6.9 g/dL (ref 6.5–8.1)

## 2021-02-02 LAB — IRON AND TIBC
Iron: 24 ug/dL — ABNORMAL LOW (ref 41–142)
Saturation Ratios: 4 % — ABNORMAL LOW (ref 21–57)
TIBC: 606 ug/dL — ABNORMAL HIGH (ref 236–444)
UIBC: 582 ug/dL — ABNORMAL HIGH (ref 120–384)

## 2021-02-02 LAB — FERRITIN: Ferritin: 6 ng/mL — ABNORMAL LOW (ref 11–307)

## 2021-02-02 LAB — VITAMIN B12: Vitamin B-12: 267 pg/mL (ref 180–914)

## 2021-02-02 MED ORDER — POLYSACCHARIDE IRON COMPLEX 150 MG PO CAPS: 150.0000 mg | ORAL_CAPSULE | Freq: Every day | ORAL | 5 refills | Status: DC

## 2021-02-03 ENCOUNTER — Telehealth: Payer: Self-pay | Admitting: Hematology

## 2021-02-03 NOTE — Telephone Encounter (Signed)
Scheduled follow-up appointment per 12/1 los. Patient is aware. 

## 2021-02-06 ENCOUNTER — Telehealth: Payer: Self-pay

## 2021-02-06 NOTE — Telephone Encounter (Signed)
Iron infusion#1 set up for pt at [facility]. Pt contacted and made aware 02/16/21 at 8 am. Pt acknowledged date and time.

## 2021-02-08 ENCOUNTER — Other Ambulatory Visit: Payer: Self-pay | Admitting: Obstetrics and Gynecology

## 2021-02-08 DIAGNOSIS — Z3689 Encounter for other specified antenatal screening: Secondary | ICD-10-CM

## 2021-02-08 NOTE — Progress Notes (Addendum)
HEMATOLOGY/ONCOLOGY CLINIC NOTE  Date of Service: .02/02/2021   Patient Care Team: Patient, No Pcp Per (Inactive) as PCP - General (General Practice)  CHIEF COMPLAINTS/PURPOSE OF CONSULTATION:  Patient with delayed follow-up for iron deficiency anemia after being lost to follow-up  HISTORY OF PRESENTING ILLNESS:   Samantha Alexander is a wonderful 30 y.o. female who has been referred to [name] by Dr Francisca December for evaluation and management of iron deficiency. The pt reports that she is doing well overall.  The pt reports that she was first told that she was borderline anemic when she was about 30 years old. She was never given a reason for her anemia. Pt reports that over the last few years her period has gotten heavier. Her periods are typically 5-6 days and are very heavy for about 3 days. Pt has been given PO Ferrous Sulfate but is not very consistent in taking it, typically 2-3 times per week. She simply forgets or chooses not to take them. She has experienced constipation with PO Iron. The last time she took one was about a month ago. Pt has never had an IV Iron infusion but has been offered blood transfusions multiple times, with her last being offered 9 months ago around childbirth. She did not choose to go through with a blood transfusion at that time as she was concerned about the complications. Pt has had one confirmed pregnancy and has one child. Her Hgb was between 8.7-8.3 around the time of birth. Pt has been seeing Nigel Bridgeman, CNM under the supervision of Dr. Su Hilt at [facility] and will see her again next month. She is not currently on birth control and was not given any information on why her periods are heavy. She does not have any dietary restrictions and eats a fairly balanced diet. She is currently being weaned off of Amlodipine, taking a dose about every 3 days. She was placed on Amlodipine due to gestational HTN and eventually developed pre-eclampsia. They do  not think that she has HTN at baseline. Pt breastfed for a month after birth. Her weight has been fluctuating and she denies any unexpected weight loss.   Her fatigue is limiting and keeps her from certain activities. She does note some ice cravings and regularly eats 32 oz of ice at a time. Pt has hemorrhoids but they are not actively bleeding. She denies any bloody/black stools or other bleeding concerns.   Pt's 53 y/o sister has had Leukemia for about 10 years. There is a family history of anemia in women in her family, there are no other known blood/clotting disorders. She also has an aunt with CHF. Pt is a non-smoker, has no history of drug use and does not drink EtOH outside of social situations.   On review of systems, pt reports fatigue and denies abdominal pain, urinary problems, bleeding hemorrhoids, bloody/black stools, other bleeding concerns, unexpected weight loss and any other symptoms.   On PMHx the pt reports gestational HTN, pre-eclampsia, Menorrhagia. On Social Hx the pt reports she is a non-smoker, has no history of drug use and does not drink EtOH outside of social situations On Family Hx the pt reports a sister with Leukemia and an aunt with CHF  INTERVAL HISTORY  .Samantha Alexander has been referred back to [name] for evaluation and management of iron deficiency anemia.  She was seen by [name] in clinic once in the past on 12/16/2018 for iron deficiency anemia due to menorrhagia.  Patient had opted  to pursue oral iron and was on iron polysaccharide 150 mg p.o. twice daily and was to return in 10 weeks but did not follow-up.  She has been referred back by to [name] by Donnel Saxon CNM for management of iron deficiency anemia during pregnancy.  Patient is currently nearly [redacted] weeks pregnant with twins. She likely entered the pregnancy being quite iron deficient. CBC done on 01/03/2021 with her OB/GYN team showed a hemoglobin of 8.3 with an MCV of 71.9 platelet count of 363k and WBC count of  12k Patient's hemoglobin was 10.3 on 10/31/2020. Ferritin and iron profile is not available.  Patient notes she is taking her prenatal vitamins which has a little bit of iron oral iron at this time.  She notes she has had issues with increased nausea with her twin pregnancy.  We discussed options for replacement of her iron both orally and IV in details. Patient is very enthusiastic about receiving IV iron to correct her iron deficiency anemia.  MEDICAL HISTORY:  Past Medical History:  Diagnosis Date   Abscess of vulva    Anemia    Headache    HPV (human papilloma virus) anogenital infection    Pregnancy induced hypertension     SURGICAL HISTORY: Past Surgical History:  Procedure Laterality Date   hemorrhoid ligation     WISDOM TOOTH EXTRACTION      SOCIAL HISTORY: Social History   Socioeconomic History   Marital status: Single    Spouse name: Not on file   Number of children: Not on file   Years of education: Not on file   Highest education level: Not on file  Occupational History   Not on file  Tobacco Use   Smoking status: Never   Smokeless tobacco: Never  Vaping Use   Vaping Use: Never used  Substance and Sexual Activity   Alcohol use: Never   Drug use: Never   Sexual activity: Not Currently  Other Topics Concern   Not on file  Social History Narrative   Not on file   Social Determinants of Health   Financial Resource Strain: Not on file  Food Insecurity: Not on file  Transportation Needs: Not on file  Physical Activity: Not on file  Stress: Not on file  Social Connections: Not on file  Intimate Partner Violence: Not on file    FAMILY HISTORY: Family History  Problem Relation Age of Onset   Hypertension Mother    Diabetes Mother    Hypertension Father    Diabetes Father    Diabetes Sister    Lupus Sister     ALLERGIES:  is allergic to fentanyl.  MEDICATIONS:  Current Outpatient Medications  Medication Sig Dispense Refill   BABY ASPIRIN  PO Take 81 mg by mouth daily.     cholecalciferol (VITAMIN D3) 25 MCG (1000 UNIT) tablet Take 1,000 Units by mouth daily.     Doxylamine-Pyridoxine 10-10 MG TBEC Diclegis 10 mg-10 mg tablet,delayed release  Take 1 tablet twice a day by oral route.     iron polysaccharides (NIFEREX) 150 MG capsule Take 1 capsule (150 mg total) by mouth daily. 30 capsule 5   labetalol (NORMODYNE) 100 MG tablet Take 100 mg by mouth 2 (two) times daily.     omeprazole (PRILOSEC) 10 MG capsule Take 10 mg by mouth daily.     ondansetron (ZOFRAN) 4 MG tablet Take 4 mg by mouth 2 (two) times daily.     Prenatal Vit-Fe Fumarate-FA (PREPLUS) 27-1 MG TABS  Take by mouth.     prochlorperazine (COMPAZINE) 10 MG tablet Take 1 tablet (10 mg total) by mouth every 6 (six) hours as needed for nausea or vomiting. 30 tablet 0   promethazine (PHENERGAN) 25 MG tablet Take 25 mg by mouth every 6 (six) hours as needed for nausea or vomiting.     scopolamine (TRANSDERM-SCOP) 1 MG/3DAYS Place 1 patch (1.5 mg total) onto the skin every 3 (three) days. 10 patch 12   terconazole (TERAZOL 7) 0.4 % vaginal cream INSERT 1 APPLICATORFUL VAGINALLY  AT BEDTIME FOR 7 DAYS 45 g 0   Doxylamine-Pyridoxine 10-10 MG TBEC Take by mouth.     No current facility-administered medications for this visit.    REVIEW OF SYSTEMS:    .10 Point review of Systems was done is negative except as noted above.   PHYSICAL EXAMINATION: ECOG PERFORMANCE STATUS: 2 - Symptomatic, <50% confined to bed  . Vitals:   02/02/21 0950  BP: 131/79  Pulse: (!) 106  Resp: 20  Temp: (!) 97.2 F (36.2 C)  SpO2: 100%   Filed Weights   02/02/21 0950  Weight: 229 lb 8 oz (104.1 kg)   .Body mass index is 34.9 kg/m. Marland Kitchen GENERAL:alert, in no acute distress and comfortable SKIN: no acute rashes, no significant lesions EYES: conjunctiva are pink and non-injected, sclera anicteric OROPHARYNX: MMM, no exudates, no oropharyngeal erythema or ulceration NECK: supple, no  JVD LYMPH:  no palpable lymphadenopathy in the cervical, axillary or inguinal regions LUNGS: clear to auscultation b/l with normal respiratory effort HEART: regular rate & rhythm ABDOMEN:  normoactive bowel sounds , non tender, gravid uterus Extremity: 1+ bilateral pedal edema PSYCH: alert & oriented x 3 with fluent speech NEURO: no focal motor/sensory deficits   LABORATORY DATA:  I have reviewed the data as listed  . CBC Latest Ref Rng & Units 02/02/2021 01/03/2021 10/31/2020  WBC 4.0 - 10.5 K/uL 10.6(H) 12.0(H) 12.3(H)  Hemoglobin 12.0 - 15.0 g/dL 8.3(L) 8.3(L) 10.3(L)  Hematocrit 36.0 - 46.0 % 27.4(L) 27.1(L) 33.9(L)  Platelets 150 - 400 K/uL 352 363 412(H)    . CMP Latest Ref Rng & Units 02/02/2021 10/31/2020 12/16/2018  Glucose 70 - 99 mg/dL 85 85 89  BUN 6 - 20 mg/dL 8 10 12   Creatinine 0.44 - 1.00 mg/dL 0.70 0.74 0.83  Sodium 135 - 145 mmol/L 137 134(L) 141  Potassium 3.5 - 5.1 mmol/L 3.7 3.7 3.7  Chloride 98 - 111 mmol/L 106 102 108  CO2 22 - 32 mmol/L 22 23 25   Calcium 8.9 - 10.3 mg/dL 8.8(L) 9.5 9.3  Total Protein 6.5 - 8.1 g/dL 6.9 7.3 7.3  Total Bilirubin 0.3 - 1.2 mg/dL 0.3 0.3 0.3  Alkaline Phos 38 - 126 U/L 203(H) 98 86  AST 15 - 41 U/L 36 16 11(L)  ALT 0 - 44 U/L 74(H) 22 9   . Lab Results  Component Value Date   IRON 24 (L) 02/02/2021   TIBC 606 (H) 02/02/2021   IRONPCTSAT 4 (L) 02/02/2021   (Iron and TIBC)  Lab Results  Component Value Date   FERRITIN 6 (L) 02/02/2021   B12 - 267   RADIOGRAPHIC STUDIES: I have personally reviewed the radiological images as listed and agreed with the findings in the report. No results found.  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  1) Iron deficiency anemia due  to previous history of menorrhagia plus additional iron requirements currently with twin pregnancy at [redacted] weeks  gestation . Lab Results  Component Value Date   IRON 24 (L) 02/02/2021   TIBC 606 (H) 02/02/2021   IRONPCTSAT 4 (L) 02/02/2021   (Iron and TIBC)  Lab Results  Component Value Date   FERRITIN 6 (L) 02/02/2021   2) mild B12 deficiency B12 267 PLAN: -We discussed available labs in details. -Patient's current hemoglobin is 8.3 with a ferritin of 6 and iron saturation of 4% suggesting severe iron deficiency. -She entered this during pregnancy being iron deficient at baseline and has increasing iron needs in her second and more so in her third trimester pregnancy and is likely to require transfusion support unless her iron is replaced aggressively. -Thus far she has been on prenatal vitamin with a little iron which will not be enough. -We discussed pros and cons and potential adverse effects and alternative forms of iron replacement. -We talked about the role of IV iron replacement and different types of iron preparations. -Patient is enthusiastic about proceeding with IV iron given her significant anemia fatigue and significant increase iron needs in the setting of twin pregnancy and since she is still having nausea and may not tolerate high-dose oral iron. -We shall set her up for IV Venofer 300 mg weekly for 3 doses at [facility] day center with Tylenol 650 mg and loratadine 10 mg as premedications. -Also recommended patient's start taking iron polysaccharide 150 mg p.o. daily with food . -Continue prenatal vitamins -Would add vitamin B12 500 to 1000 mcg p.o. daily.  FOLLOW UP: Labs today IV Venofer weekly x3 doses at [facility] day center as soon as possible Return to clinic with Dr. Irene Limbo with labs in 8 weeks  All of the patients questions were answered with apparent satisfaction. The patient knows to call the clinic with any problems, questions or concerns.    Sullivan Lone MD [place] AAHIVMS [facility] Hematology/Oncology Physician [facility]

## 2021-02-10 NOTE — Addendum Note (Signed)
Addended by: Wyvonnia Lora on: 02/10/2021 09:04 AM   Modules accepted: Orders

## 2021-02-15 ENCOUNTER — Encounter: Payer: Self-pay | Admitting: Cardiology

## 2021-02-15 ENCOUNTER — Ambulatory Visit (INDEPENDENT_AMBULATORY_CARE_PROVIDER_SITE_OTHER): Payer: Medicaid Other

## 2021-02-15 ENCOUNTER — Ambulatory Visit (INDEPENDENT_AMBULATORY_CARE_PROVIDER_SITE_OTHER): Payer: Medicaid Other | Admitting: Cardiology

## 2021-02-15 ENCOUNTER — Other Ambulatory Visit: Payer: Self-pay

## 2021-02-15 VITALS — BP 120/60 | HR 111 | Ht 67.0 in | Wt 231.0 lb

## 2021-02-15 DIAGNOSIS — R0602 Shortness of breath: Secondary | ICD-10-CM

## 2021-02-15 DIAGNOSIS — R42 Dizziness and giddiness: Secondary | ICD-10-CM | POA: Diagnosis not present

## 2021-02-15 DIAGNOSIS — R55 Syncope and collapse: Secondary | ICD-10-CM

## 2021-02-15 DIAGNOSIS — O10919 Unspecified pre-existing hypertension complicating pregnancy, unspecified trimester: Secondary | ICD-10-CM

## 2021-02-15 DIAGNOSIS — Z8759 Personal history of other complications of pregnancy, childbirth and the puerperium: Secondary | ICD-10-CM | POA: Diagnosis not present

## 2021-02-15 NOTE — Patient Instructions (Addendum)
Medication Instructions:  °Your physician recommends that you continue on your current medications as directed. Please refer to the Current Medication list given to you today.  °*If you need a refill on your cardiac medications before your next appointment, please call your pharmacy* ° ° °Lab Work: °None °If you have labs (blood work) drawn today and your tests are completely normal, you will receive your results only by: °MyChart Message (if you have MyChart) OR °A paper copy in the mail °If you have any lab test that is abnormal or we need to change your treatment, we will call you to review the results. ° ° °Testing/Procedures: °Your physician has requested that you have an echocardiogram. Echocardiography is a painless test that uses sound waves to create images of your heart. It provides your doctor with information about the size and shape of your heart and how well your heart’s chambers and valves are working. This procedure takes approximately one hour. There are no restrictions for this procedure. ° °ZIO XT- Long Term Monitor Instructions ° °Your physician has requested you wear a ZIO patch monitor for 14 days.  °This is a single patch monitor. [employer] supplies one patch monitor per enrollment. Additional °stickers are not available. Please do not apply patch if you will be having a Nuclear Stress Test,  °Echocardiogram, Cardiac CT, MRI, or Chest Xray during the period you would be wearing the  °monitor. The patch cannot be worn during these tests. You cannot remove and re-apply the  °ZIO XT patch monitor.  °Your ZIO patch monitor will be mailed 3 day USPS to your address on file. It may take 3-5 days  °to receive your monitor after you have been enrolled.  °Once you have received your monitor, please review the enclosed instructions. Your monitor  °has already been registered assigning a specific monitor serial # to you. ° °Billing and Patient Assistance Program Information ° °We have supplied [employer] with  any of your insurance information on file for billing purposes. °[employer] offers a sliding scale Patient Assistance Program for patients that do not have  °insurance, or whose insurance does not completely cover the cost of the ZIO monitor.  °You must apply for the Patient Assistance Program to qualify for this discounted rate.  °To apply, please call [employer] at [phone], select option 4, select option 2, ask to apply for  °Patient Assistance Program. [employer] will ask your household income, and how many people  °are in your household. They will quote your out-of-pocket cost based on that information.  °[employer] will also be able to set up a 12-month, interest-free payment plan if needed. ° °Applying the monitor °  °Shave hair from upper left chest.  °Hold abrader disc by orange tab. Rub abrader in 40 strokes over the upper left chest as  °indicated in your monitor instructions.  °Clean area with 4 enclosed alcohol pads. Let dry.  °Apply patch as indicated in monitor instructions. Patch will be placed under collarbone on left  °side of chest with arrow pointing upward.  °Rub patch adhesive wings for 2 minutes. Remove white label marked "1". Remove the white  °label marked "2". Rub patch adhesive wings for 2 additional minutes.  °While looking in a mirror, press and release button in center of patch. A small green light will  °flash 3-4 times. This will be your only indicator that the monitor has been turned on.  °Do not shower for the first 24 hours. You may shower after the first   24 hours.  Press the button if you feel a symptom. You will hear a small click. Record Date, Time and  Symptom in the Patient Logbook.  When you are ready to remove the patch, follow instructions on the last 2 pages of Patient  Logbook. Stick patch monitor onto the last page of Patient Logbook.  Place Patient Logbook in the blue and white box. Use locking tab on box and tape box closed  securely. The blue and white box has prepaid  postage on it. Please place it in the mailbox as  soon as possible. Your physician should have your test results approximately 7 days after the  monitor has been mailed back to [facility].  Call [facility] Customer Care at [phone] if you have questions regarding  your ZIO XT patch monitor. Call them immediately if you see an orange light blinking on your  monitor.  If your monitor falls off in less than 4 days, contact our Monitor department at [phone].  If your monitor becomes loose or falls off after 4 days call [employer] at [phone] for  suggestions on securing your monitor.    Follow-Up: At [facility], you and your health needs are our priority.  As part of our continuing mission to provide you with exceptional heart care, we have created designated Provider Care Teams.  These Care Teams include your primary Cardiologist (physician) and Advanced Practice Providers (APPs -  Physician Assistants and Nurse Practitioners) who all work together to provide you with the care you need, when you need it.  We recommend signing up for the patient portal called "MyChart".  Sign up information is provided on this After Visit Summary.  MyChart is used to connect with patients for Virtual Visits (Telemedicine).  Patients are able to view lab/test results, encounter notes, upcoming appointments, etc.  Non-urgent messages can be sent to your provider as well.   To learn more about what you can do with MyChart, go to [email or web address].    Your next appointment:   10 week(s)  The format for your next appointment:   In Person  Provider:   Thomasene Ripple, DO   Other Instructions

## 2021-02-15 NOTE — Progress Notes (Signed)
[facility]  New Evaluation  Date:  02/15/2021   ID:  Samantha Alexander, DOB September 05, 1990, MRN [id number]  PCP:  Patient, No Pcp Per (Inactive)   [facility] Providers Cardiologist:  None  Electrophysiologist:  None       Referring MD: Nigel Bridgeman, CNM   Chief Complaint: " I am doing we  History of Present Illness:    Samantha Alexander is a 30 y.o. female [G2P1001] who is being seen today for the evaluation of shortness of breath and presyncope at the request of Nigel Bridgeman, CNM.   She has a medical chronic anemia, chronic hypertension in pregnancy, history of preeclampsia was referred by her OB provider to be evaluated for presyncope, palpitation shortness of breath.  The patient tells me that this is her second pregnancy, first pregnancy was placated by preeclampsia in the last trimester at which time she was induced.  She delivered safely.  After that she developed hypertension and has since been on antihypertensive medications.  Transition to labetalol when she became pregnant with recently.  She tells me that recently she has been experiencing significant shortness of breath and exertion.  She tells me she works at [employer] and part of her job is to work around the store complaints.  She was able to do that without any problems during her last pregnancy and even up to recently.  She tells me that when she walks some distance she feels as if she is going to pass out.  She feels short of breath and she has significant palpitations.  She notes her heart rate is going up into the 140s.  She is concerned.  She fell recently she had a significant episode where she felt significantly lightheaded, abrupt onset of fast heart rate and then she got short of breath.  She is felt like she started to blackout.  She did not pass out.  But she really believes she was close to passing out.  No other complaints at this time.   Prior CV Studies Reviewed: The following studies were  reviewed today:  Nuclear stress 2020 CLINICAL DATA:  Chest pain and family history of cardiac disease.   EXAM: MYOCARDIAL IMAGING WITH SPECT (REST AND PHARMACOLOGIC-STRESS)   GATED LEFT VENTRICULAR WALL MOTION STUDY   LEFT VENTRICULAR EJECTION FRACTION   TECHNIQUE: Standard myocardial SPECT imaging was performed after resting intravenous injection of 10 mCi Tc-48m tetrofosmin. Subsequently, intravenous infusion of Lexiscan was performed under the supervision of the Cardiology staff. At peak effect of the drug, 30 mCi Tc-92m tetrofosmin was injected intravenously and standard myocardial SPECT imaging was performed. Quantitative gated imaging was also performed to evaluate left ventricular wall motion, and estimate left ventricular ejection fraction.   COMPARISON:  None.   FINDINGS: Perfusion: Apparent decreased activity anteriorly is likely due to chest wall artifact. With stress, there is a moderate size area of mild reversibility involving the apex and apical to mid inferior wall.   Wall Motion: Global mild hypokinesis, most apparent within the anterior wall. Mild left ventricular dilatation.   Left Ventricular Ejection Fraction: 54 %   End diastolic volume 121 ml   End systolic volume 56 ml   IMPRESSION: 1. Suspicion of mild reversibility involving the apex and apical to mid inferior wall. Cannot exclude inducible ischemia.   2. Global hypokinesis, most apparent anteriorly.   3. Left ventricular ejection fraction 54%   4. Non invasive risk stratification*: Intermediate.   *2012 Appropriate Use Criteria for Coronary Revascularization Focused Update: J  Am Coll Cardiol. 2012;59(9):857-881. http://content.[email or web address].aspx?articleid=[email or web address]    Past Medical History:  Diagnosis Date   Abscess of vulva    Anemia    Headache    HPV (human papilloma virus) anogenital infection    Pregnancy induced hypertension     Past Surgical History:  Procedure  Laterality Date   hemorrhoid ligation     WISDOM TOOTH EXTRACTION        OB History     Gravida  2   Para  1   Term  1   Preterm      AB      Living  1      SAB      IAB      Ectopic      Multiple  0   Live Births  1               Current Medications: Current Meds  Medication Sig   BABY ASPIRIN PO Take 81 mg by mouth daily.   cholecalciferol (VITAMIN D3) 25 MCG (1000 UNIT) tablet Take 1,000 Units by mouth daily.   Doxylamine-Pyridoxine 10-10 MG TBEC Diclegis 10 mg-10 mg tablet,delayed release  Take 1 tablet twice a day by oral route.   iron polysaccharides (NIFEREX) 150 MG capsule Take 1 capsule (150 mg total) by mouth daily.   labetalol (NORMODYNE) 100 MG tablet Take 100 mg by mouth 2 (two) times daily.   omeprazole (PRILOSEC) 10 MG capsule Take 10 mg by mouth daily.   ondansetron (ZOFRAN) 4 MG tablet Take 4 mg by mouth 2 (two) times daily.   Prenatal Vit-Fe Fumarate-FA (PREPLUS) 27-1 MG TABS Take by mouth.   prochlorperazine (COMPAZINE) 10 MG tablet Take 1 tablet (10 mg total) by mouth every 6 (six) hours as needed for nausea or vomiting.   promethazine (PHENERGAN) 25 MG tablet Take 25 mg by mouth every 6 (six) hours as needed for nausea or vomiting.   scopolamine (TRANSDERM-SCOP) 1 MG/3DAYS Place 1 patch (1.5 mg total) onto the skin every 3 (three) days.   terconazole (TERAZOL 7) 0.4 % vaginal cream INSERT 1 APPLICATORFUL VAGINALLY  AT BEDTIME FOR 7 DAYS   [DISCONTINUED] Doxylamine-Pyridoxine 10-10 MG TBEC Take by mouth.     Allergies:   Fentanyl   Social History   Socioeconomic History   Marital status: Single    Spouse name: Not on file   Number of children: Not on file   Years of education: Not on file   Highest education level: Not on file  Occupational History   Not on file  Tobacco Use   Smoking status: Never   Smokeless tobacco: Never  Vaping Use   Vaping Use: Never used  Substance and Sexual Activity   Alcohol use: Never   Drug  use: Never   Sexual activity: Not Currently  Other Topics Concern   Not on file  Social History Narrative   Not on file   Social Determinants of Health   Financial Resource Strain: Not on file  Food Insecurity: Not on file  Transportation Needs: Not on file  Physical Activity: Not on file  Stress: Not on file  Social Connections: Not on file      Family History  Problem Relation Age of Onset   Hypertension Mother    Diabetes Mother    Hypertension Father    Diabetes Father    Diabetes Sister    Lupus Sister       ROS:   Review of Systems  Constitution: Negative for decreased appetite, fever and weight gain.  HENT: Negative for congestion, ear discharge, hoarse voice and sore throat.   Eyes: Negative for discharge, redness, vision loss in right eye and visual halos.  Cardiovascular: Negative for chest pain, dyspnea on exertion, leg swelling, orthopnea and palpitations.  Respiratory: Negative for cough, hemoptysis, shortness of breath and snoring.   Endocrine: Negative for heat intolerance and polyphagia.  Hematologic/Lymphatic: Negative for bleeding problem. Does not bruise/bleed easily.  Skin: Negative for flushing, nail changes, rash and suspicious lesions.  Musculoskeletal: Negative for arthritis, joint pain, muscle cramps, myalgias, neck pain and stiffness.  Gastrointestinal: Negative for abdominal pain, bowel incontinence, diarrhea and excessive appetite.  Genitourinary: Negative for decreased libido, genital sores and incomplete emptying.  Neurological: Negative for brief paralysis, focal weakness, headaches and loss of balance.  Psychiatric/Behavioral: Negative for altered mental status, depression and suicidal ideas.  Allergic/Immunologic: Negative for HIV exposure and persistent infections.    Labs/EKG Reviewed:    EKG:   EKG is was ordered today.  The ekg ordered today demonstrates sinus tachycardia, heart rate 111 bpm with T wave abnormality cannot rule out  old inferior wall ischemia.  No prior EKG for comparison.  Recent Labs: 02/02/2021: ALT 74; BUN 8; Creatinine 0.70; Hemoglobin 8.3; Platelet Count 352; Potassium 3.7; Sodium 137   Recent Lipid Panel No results found for: CHOL, TRIG, HDL, CHOLHDL, LDLCALC, LDLDIRECT  Physical Exam:    VS:  BP 120/60 (BP Location: Right Arm, Patient Position: Sitting, Cuff Size: Large)    Pulse (!) 111    Ht  (1.702 m)    Wt 231 lb (104.8 kg)    LMP 08/28/2020 (Exact Date)    BMI 36.18 kg/m     Wt Readings from Last 3 Encounters:  02/15/21 231 lb (104.8 kg)  02/02/21 229 lb 8 oz (104.1 kg)  01/03/21 223 lb 11.2 oz (101.5 kg)     GEN:  Well nourished, well developed in no acute distress HEENT: Normal NECK: No JVD; No carotid bruits LYMPHATICS: No lymphadenopathy CARDIAC: RRR, no murmurs, rubs, gallops RESPIRATORY:  Clear to auscultation without rales, wheezing or rhonchi  ABDOMEN: Soft, non-tender, non-distended MUSCULOSKELETAL:  No edema; No deformity  SKIN: Warm and dry NEUROLOGIC:  Alert and oriented x 3 PSYCHIATRIC:  Normal affect    Risk Assessment/Risk Calculators:     CARPREG II Risk Prediction Index Score:  1.  The patient's risk for a primary cardiac event is 5%.            ASSESSMENT & PLAN:    Shortness of breath Palpitation Chronic hypertension pregnancy History of preeclampsia  I would like to rule out a cardiovascular etiology of this palpitation, therefore at this time I would like to placed a zio patch for   14  days. In additon given her shortness of breath and presyncope a transthoracic echocardiogram will be ordered to assess LV/RV function and any structural abnormalities.  Her nuclear stress test in 2020 showed an EF of 54%.  Her blood pressure is acceptable in the office today we will continue her current dose of labetalol.  In terms of her EKG which she will T wave inversions, the patient had a pharmacologic nuclear stress test back in 2020 she had a  nuclear stress test back in 2020 could not exclude ischemia.  Thankfully she is not having any anginal symptoms t.  If needed we could repeat ischemic evaluation in the postpartum period.   Above educated the  patient about taking her blood pressure daily and notify my office if her blood pressure is greater than 140/90.  She has a blood pressure cuff at home and expresses understanding.  Please continue her aspirin 81 mg for her prophylaxis for preeclampsia.   Patient Instructions  Medication Instructions:  Your physician recommends that you continue on your current medications as directed. Please refer to the Current Medication list given to you today.  *If you need a refill on your cardiac medications before your next appointment, please call your pharmacy*   Lab Work: None If you have labs (blood work) drawn today and your tests are completely normal, you will receive your results only by: MyChart Message (if you have MyChart) OR A paper copy in the mail If you have any lab test that is abnormal or we need to change your treatment, we will call you to review the results.   Testing/Procedures: Your physician has requested that you have an echocardiogram. Echocardiography is a painless test that uses sound waves to create images of your heart. It provides your doctor with information about the size and shape of your heart and how well your hearts chambers and valves are working. This procedure takes approximately one hour. There are no restrictions for this procedure.  ZIO XT- Long Term Monitor Instructions  Your physician has requested you wear a ZIO patch monitor for 14 days.  This is a single patch monitor. [employer] supplies one patch monitor per enrollment. Additional stickers are not available. Please do not apply patch if you will be having a Nuclear Stress Test,  Echocardiogram, Cardiac CT, MRI, or Chest Xray during the period you would be wearing the  monitor. The patch cannot  be worn during these tests. You cannot remove and re-apply the  ZIO XT patch monitor.  Your ZIO patch monitor will be mailed 3 day USPS to your address on file. It may take 3-5 days  to receive your monitor after you have been enrolled.  Once you have received your monitor, please review the enclosed instructions. Your monitor  has already been registered assigning a specific monitor serial # to you.  Billing and Patient Assistance Program Information  We have supplied [employer] with any of your insurance information on file for billing purposes. [employer] offers a sliding scale Patient Assistance Program for patients that do not have  insurance, or whose insurance does not completely cover the cost of the ZIO monitor.  You must apply for the Patient Assistance Program to qualify for this discounted rate.  To apply, please call [employer] at [phone], select option 4, select option 2, ask to apply for  Patient Assistance Program. Meredeth Ide will ask your household income, and how many people  are in your household. They will quote your out-of-pocket cost based on that information.  [employer] will also be able to set up a 70-month, interest-free payment plan if needed.  Applying the monitor   Shave hair from upper left chest.  Hold abrader disc by orange tab. Rub abrader in 40 strokes over the upper left chest as  indicated in your monitor instructions.  Clean area with 4 enclosed alcohol pads. Let dry.  Apply patch as indicated in monitor instructions. Patch will be placed under collarbone on left  side of chest with arrow pointing upward.  Rub patch adhesive wings for 2 minutes. Remove white label marked "1". Remove the white  label marked "2". Rub patch adhesive wings for 2 additional minutes.  While looking  in a mirror, press and release button in center of patch. A small green light will  flash 3-4 times. This will be your only indicator that the monitor has been turned on.  Do not shower  for the first 24 hours. You may shower after the first 24 hours.  Press the button if you feel a symptom. You will hear a small click. Record Date, Time and  Symptom in the Patient Logbook.  When you are ready to remove the patch, follow instructions on the last 2 pages of Patient  Logbook. Stick patch monitor onto the last page of Patient Logbook.  Place Patient Logbook in the blue and white box. Use locking tab on box and tape box closed  securely. The blue and white box has prepaid postage on it. Please place it in the mailbox as  soon as possible. Your physician should have your test results approximately 7 days after the  monitor has been mailed back to [facility].  Call [facility] Customer Care at [phone] if you have questions regarding  your ZIO XT patch monitor. Call them immediately if you see an orange light blinking on your  monitor.  If your monitor falls off in less than 4 days, contact our Monitor department at [phone].  If your monitor becomes loose or falls off after 4 days call [employer] at [phone] for  suggestions on securing your monitor.    Follow-Up: At [facility], you and your health needs are our priority.  As part of our continuing mission to provide you with exceptional heart care, we have created designated Provider Care Teams.  These Care Teams include your primary Cardiologist (physician) and Advanced Practice Providers (APPs -  Physician Assistants and Nurse Practitioners) who all work together to provide you with the care you need, when you need it.  We recommend signing up for the patient portal called "MyChart".  Sign up information is provided on this After Visit Summary.  MyChart is used to connect with patients for Virtual Visits (Telemedicine).  Patients are able to view lab/test results, encounter notes, upcoming appointments, etc.  Non-urgent messages can be sent to your provider as well.   To learn more about what you can do  with MyChart, go to [email or web address].    Your next appointment:   10 week(s)  The format for your next appointment:   In Person  Provider:   Thomasene Ripple, DO   Other Instructions     Dispo:  No follow-ups on file.   Medication Adjustments/Labs and Tests Ordered: Current medicines are reviewed at length with the patient today.  Concerns regarding medicines are outlined above.  Tests Ordered: Orders Placed This Encounter  Procedures   LONG TERM MONITOR (3-14 DAYS)   EKG 12-Lead   ECHOCARDIOGRAM COMPLETE   Medication Changes: No orders of the defined types were placed in this encounter.

## 2021-02-15 NOTE — Progress Notes (Unsigned)
Enrolled patient for a 14 day Zio XT  monitor to be mailed to patients home  °

## 2021-02-16 ENCOUNTER — Inpatient Hospital Stay (HOSPITAL_COMMUNITY): Admission: RE | Admit: 2021-02-16 | Payer: Medicaid Other | Source: Ambulatory Visit

## 2021-02-16 ENCOUNTER — Encounter (HOSPITAL_COMMUNITY): Payer: Self-pay

## 2021-02-17 DIAGNOSIS — Z3A24 24 weeks gestation of pregnancy: Secondary | ICD-10-CM | POA: Diagnosis not present

## 2021-02-17 DIAGNOSIS — R7401 Elevation of levels of liver transaminase levels: Secondary | ICD-10-CM | POA: Diagnosis not present

## 2021-02-17 DIAGNOSIS — Z363 Encounter for antenatal screening for malformations: Secondary | ICD-10-CM | POA: Diagnosis not present

## 2021-02-20 DIAGNOSIS — R42 Dizziness and giddiness: Secondary | ICD-10-CM | POA: Diagnosis not present

## 2021-02-20 DIAGNOSIS — R55 Syncope and collapse: Secondary | ICD-10-CM | POA: Diagnosis not present

## 2021-02-23 ENCOUNTER — Encounter (HOSPITAL_COMMUNITY)
Admission: RE | Admit: 2021-02-23 | Discharge: 2021-02-23 | Disposition: A | Discharge status: Home / Self Care | Payer: Medicaid Other | Source: Ambulatory Visit | Attending: Hematology | Admitting: Hematology

## 2021-02-23 DIAGNOSIS — O99012 Anemia complicating pregnancy, second trimester: Secondary | ICD-10-CM | POA: Insufficient documentation

## 2021-02-23 DIAGNOSIS — D509 Iron deficiency anemia, unspecified: Secondary | ICD-10-CM | POA: Diagnosis not present

## 2021-02-23 MED ORDER — LORATADINE 10 MG PO TABS
ORAL_TABLET | ORAL | Status: AC
  Administered 2021-02-23: 09:00:00 10 mg via ORAL
  Filled 2021-02-23: qty 1

## 2021-02-23 MED ORDER — SODIUM CHLORIDE 0.9 % IV SOLN
300.0000 mg | INTRAVENOUS | Status: DC
  Administered 2021-02-23: 10:00:00 300 mg via INTRAVENOUS
  Filled 2021-02-23: qty 300

## 2021-02-23 MED ORDER — LORATADINE 10 MG PO TABS: 10.0000 mg | ORAL_TABLET | Freq: Every day | ORAL | Status: DC

## 2021-02-23 MED ORDER — ACETAMINOPHEN 325 MG PO TABS: 650.0000 mg | ORAL_TABLET | Freq: Four times a day (QID) | ORAL | Status: DC | PRN

## 2021-02-23 MED ORDER — ACETAMINOPHEN 325 MG PO TABS
ORAL_TABLET | ORAL | Status: AC
  Administered 2021-02-23: 09:00:00 650 mg via ORAL
  Filled 2021-02-23: qty 2

## 2021-03-02 ENCOUNTER — Encounter (HOSPITAL_COMMUNITY)
Admission: RE | Admit: 2021-03-02 | Discharge: 2021-03-02 | Disposition: A | Discharge status: Home / Self Care | Payer: Medicaid Other | Source: Ambulatory Visit | Attending: Hematology | Admitting: Hematology

## 2021-03-02 ENCOUNTER — Encounter (HOSPITAL_COMMUNITY): Payer: Medicaid Other

## 2021-03-02 DIAGNOSIS — D509 Iron deficiency anemia, unspecified: Secondary | ICD-10-CM

## 2021-03-02 DIAGNOSIS — O99012 Anemia complicating pregnancy, second trimester: Secondary | ICD-10-CM

## 2021-03-02 MED ORDER — EPINEPHRINE PF 1 MG/ML IJ SOLN: 0.3000 mg | Freq: Once | INTRAMUSCULAR | Status: DC | PRN

## 2021-03-02 MED ORDER — LORATADINE 10 MG PO TABS
10.0000 mg | ORAL_TABLET | Freq: Every day | ORAL | Status: DC
  Administered 2021-03-02: 08:00:00 10 mg via ORAL

## 2021-03-02 MED ORDER — DIPHENHYDRAMINE HCL 50 MG/ML IJ SOLN: 25.0000 mg | Freq: Once | INTRAMUSCULAR | Status: DC | PRN

## 2021-03-02 MED ORDER — SODIUM CHLORIDE 0.9 % IV SOLN
300.0000 mg | INTRAVENOUS | Status: DC
  Administered 2021-03-02: 09:00:00 300 mg via INTRAVENOUS
  Filled 2021-03-02: qty 15

## 2021-03-02 MED ORDER — LORATADINE 10 MG PO TABS
ORAL_TABLET | ORAL | Status: AC
  Filled 2021-03-02: qty 1

## 2021-03-02 MED ORDER — METHYLPREDNISOLONE SODIUM SUCC 125 MG IJ SOLR: 125.0000 mg | Freq: Once | INTRAMUSCULAR | Status: DC | PRN

## 2021-03-02 MED ORDER — SODIUM CHLORIDE 0.9 % IV BOLUS: 500.0000 mL | Freq: Once | INTRAVENOUS | Status: DC | PRN

## 2021-03-02 MED ORDER — ACETAMINOPHEN 325 MG PO TABS
ORAL_TABLET | ORAL | Status: AC
  Filled 2021-03-02: qty 2

## 2021-03-02 MED ORDER — ALBUTEROL SULFATE (2.5 MG/3ML) 0.083% IN NEBU: 2.5000 mg | INHALATION_SOLUTION | Freq: Once | RESPIRATORY_TRACT | Status: DC | PRN

## 2021-03-02 MED ORDER — ACETAMINOPHEN 325 MG PO TABS
650.0000 mg | ORAL_TABLET | Freq: Four times a day (QID) | ORAL | Status: DC | PRN
  Administered 2021-03-02: 08:00:00 650 mg via ORAL

## 2021-03-03 ENCOUNTER — Ambulatory Visit (HOSPITAL_COMMUNITY): Payer: Medicaid Other | Attending: Cardiovascular Disease

## 2021-03-05 DIAGNOSIS — Z419 Encounter for procedure for purposes other than remedying health state, unspecified: Secondary | ICD-10-CM | POA: Diagnosis not present

## 2021-03-07 ENCOUNTER — Encounter (HOSPITAL_COMMUNITY): Payer: Self-pay | Admitting: Cardiology

## 2021-03-09 ENCOUNTER — Inpatient Hospital Stay (HOSPITAL_COMMUNITY): Admission: RE | Admit: 2021-03-09 | Payer: Medicaid Other | Source: Ambulatory Visit

## 2021-03-10 ENCOUNTER — Ambulatory Visit: Payer: Medicaid Other

## 2021-03-13 ENCOUNTER — Other Ambulatory Visit: Payer: Self-pay

## 2021-03-13 ENCOUNTER — Ambulatory Visit (HOSPITAL_COMMUNITY): Payer: Medicaid Other | Attending: Cardiovascular Disease

## 2021-03-13 DIAGNOSIS — R0602 Shortness of breath: Secondary | ICD-10-CM | POA: Insufficient documentation

## 2021-03-13 DIAGNOSIS — R55 Syncope and collapse: Secondary | ICD-10-CM | POA: Diagnosis not present

## 2021-03-13 DIAGNOSIS — R42 Dizziness and giddiness: Secondary | ICD-10-CM | POA: Insufficient documentation

## 2021-03-13 LAB — ECHOCARDIOGRAM COMPLETE
Area-P 1/2: 4.37 cm2
S' Lateral: 2.5 cm

## 2021-03-16 DIAGNOSIS — Z23 Encounter for immunization: Secondary | ICD-10-CM | POA: Diagnosis not present

## 2021-03-16 DIAGNOSIS — Z3A28 28 weeks gestation of pregnancy: Secondary | ICD-10-CM | POA: Diagnosis not present

## 2021-03-16 DIAGNOSIS — R7401 Elevation of levels of liver transaminase levels: Secondary | ICD-10-CM | POA: Diagnosis not present

## 2021-03-16 DIAGNOSIS — Z331 Pregnant state, incidental: Secondary | ICD-10-CM | POA: Diagnosis not present

## 2021-03-16 DIAGNOSIS — O30009 Twin pregnancy, unspecified number of placenta and unspecified number of amniotic sacs, unspecified trimester: Secondary | ICD-10-CM | POA: Diagnosis not present

## 2021-03-20 ENCOUNTER — Encounter (HOSPITAL_COMMUNITY)
Admission: RE | Admit: 2021-03-20 | Discharge: 2021-03-20 | Disposition: A | Discharge status: Home / Self Care | Payer: Medicaid Other | Source: Ambulatory Visit | Attending: Hematology | Admitting: Hematology

## 2021-03-20 DIAGNOSIS — O99012 Anemia complicating pregnancy, second trimester: Secondary | ICD-10-CM | POA: Diagnosis not present

## 2021-03-20 DIAGNOSIS — D509 Iron deficiency anemia, unspecified: Secondary | ICD-10-CM | POA: Diagnosis not present

## 2021-03-20 DIAGNOSIS — Z3A00 Weeks of gestation of pregnancy not specified: Secondary | ICD-10-CM | POA: Insufficient documentation

## 2021-03-20 MED ORDER — ACETAMINOPHEN 325 MG PO TABS
ORAL_TABLET | ORAL | Status: AC
  Administered 2021-03-20: 650 mg
  Filled 2021-03-20: qty 2

## 2021-03-20 MED ORDER — DIPHENHYDRAMINE HCL 50 MG/ML IJ SOLN: 25.0000 mg | Freq: Once | INTRAMUSCULAR | Status: DC | PRN

## 2021-03-20 MED ORDER — ACETAMINOPHEN 325 MG PO TABS: 650.0000 mg | ORAL_TABLET | Freq: Four times a day (QID) | ORAL | Status: DC | PRN

## 2021-03-20 MED ORDER — EPINEPHRINE PF 1 MG/ML IJ SOLN: 0.3000 mg | Freq: Once | INTRAMUSCULAR | Status: DC | PRN

## 2021-03-20 MED ORDER — LORATADINE 10 MG PO TABS: 10.0000 mg | ORAL_TABLET | Freq: Every day | ORAL | Status: DC

## 2021-03-20 MED ORDER — SODIUM CHLORIDE 0.9 % IV SOLN
300.0000 mg | INTRAVENOUS | Status: DC
  Administered 2021-03-20: 300 mg via INTRAVENOUS
  Filled 2021-03-20: qty 300

## 2021-03-20 MED ORDER — ALBUTEROL SULFATE (2.5 MG/3ML) 0.083% IN NEBU: 2.5000 mg | INHALATION_SOLUTION | Freq: Once | RESPIRATORY_TRACT | Status: DC | PRN

## 2021-03-20 MED ORDER — SODIUM CHLORIDE 0.9 % IV BOLUS: 500.0000 mL | Freq: Once | INTRAVENOUS | Status: DC | PRN

## 2021-03-20 MED ORDER — LORATADINE 10 MG PO TABS
ORAL_TABLET | ORAL | Status: AC
  Administered 2021-03-20: 10 mg
  Filled 2021-03-20: qty 1

## 2021-03-20 MED ORDER — METHYLPREDNISOLONE SODIUM SUCC 125 MG IJ SOLR: 125.0000 mg | Freq: Once | INTRAMUSCULAR | Status: DC | PRN

## 2021-03-28 ENCOUNTER — Other Ambulatory Visit: Payer: Self-pay | Admitting: Obstetrics and Gynecology

## 2021-03-28 ENCOUNTER — Ambulatory Visit: Payer: Medicaid Other | Attending: Obstetrics and Gynecology

## 2021-03-28 ENCOUNTER — Encounter: Payer: Self-pay | Admitting: *Deleted

## 2021-03-28 ENCOUNTER — Ambulatory Visit (HOSPITAL_BASED_OUTPATIENT_CLINIC_OR_DEPARTMENT_OTHER): Payer: Medicaid Other | Admitting: Obstetrics

## 2021-03-28 ENCOUNTER — Ambulatory Visit: Payer: Medicaid Other | Admitting: *Deleted

## 2021-03-28 ENCOUNTER — Other Ambulatory Visit: Payer: Self-pay

## 2021-03-28 VITALS — BP 130/77 | HR 106

## 2021-03-28 DIAGNOSIS — Z3A29 29 weeks gestation of pregnancy: Secondary | ICD-10-CM

## 2021-03-28 DIAGNOSIS — Z3689 Encounter for other specified antenatal screening: Secondary | ICD-10-CM

## 2021-03-28 DIAGNOSIS — O30003 Twin pregnancy, unspecified number of placenta and unspecified number of amniotic sacs, third trimester: Secondary | ICD-10-CM

## 2021-03-28 DIAGNOSIS — O10013 Pre-existing essential hypertension complicating pregnancy, third trimester: Secondary | ICD-10-CM

## 2021-03-28 DIAGNOSIS — O99213 Obesity complicating pregnancy, third trimester: Secondary | ICD-10-CM

## 2021-03-28 DIAGNOSIS — O30043 Twin pregnancy, dichorionic/diamniotic, third trimester: Secondary | ICD-10-CM | POA: Diagnosis not present

## 2021-03-28 DIAGNOSIS — Z3A30 30 weeks gestation of pregnancy: Secondary | ICD-10-CM | POA: Insufficient documentation

## 2021-03-28 DIAGNOSIS — O365931 Maternal care for other known or suspected poor fetal growth, third trimester, fetus 1: Secondary | ICD-10-CM | POA: Diagnosis not present

## 2021-03-28 DIAGNOSIS — O10913 Unspecified pre-existing hypertension complicating pregnancy, third trimester: Secondary | ICD-10-CM | POA: Diagnosis not present

## 2021-03-28 DIAGNOSIS — O09293 Supervision of pregnancy with other poor reproductive or obstetric history, third trimester: Secondary | ICD-10-CM | POA: Diagnosis not present

## 2021-03-29 ENCOUNTER — Other Ambulatory Visit: Payer: Self-pay | Admitting: *Deleted

## 2021-03-29 DIAGNOSIS — O365932 Maternal care for other known or suspected poor fetal growth, third trimester, fetus 2: Secondary | ICD-10-CM

## 2021-03-29 DIAGNOSIS — O30043 Twin pregnancy, dichorionic/diamniotic, third trimester: Secondary | ICD-10-CM

## 2021-03-30 NOTE — Progress Notes (Signed)
MFM Note   Samantha Alexander was seen for an ultrasound exam and consultation due to fetal growth discordance noted between the two fetuses in a spontaneously conceived dichorionic, diamniotic twin gestation.  She denies any other problems in her current pregnancy and reports feeling vigorous fetal movements of both fetuses throughout the day.  She also has a history of chronic hypertension that is currently treated with labetalol.  She has declined all screening tests for fetal aneuploidy in her current pregnancy.  Twin A: EFW 3 pounds 6 ounces (50th percentile for her gestational age). Normal amniotic fluid.  Doppler studies of the umbilical arteries showed continued normal forward flow.  There were no signs of absent or reversed end-diastolic flow noted.    Twin B: EFW 2 pounds 13 ounces (9th percentile for her gestational age, indicating IUGR).  Normal amniotic fluid.  Doppler studies of the umbilical arteries showed continued normal forward flow.  There were no signs of absent or reversed end-diastolic flow noted.    The growth discordance between the two fetuses was 17%. Fetal movements of both twin A and twin B were noted throughout today's exam.  The implications and management of fetal growth restriction was discussed with the patient.  She was advised regarding the increased risk of an adverse pregnancy outcome such as stillbirth associated with IUGR, especially if the umbilical artery Doppler studies are abnormal.    She was reassured that fetal growth restriction is a common finding and that most cases result in the delivery of a healthy infant close to term.  Due to fetal growth restriction in a dichorionic twin gestation, we will continue to follow her with weekly biophysical profiles and umbilical artery Doppler studies.  We will reassess the fetal growth again in 3 weeks.  As long as the weekly fetal testing and umbilical artery Doppler studies are reassuring, delivery should  probably occur at between 36 to 37 weeks.  A biophysical profile and umbilical artery Doppler study was scheduled in our office in 1 week.    She was advised to continue taking labetalol for blood pressure control for the remainder of her pregnancy.  At the end of the consultation, the patient stated that all of her questions have been answered to her complete satisfaction.    A total of 30 minutes was spent counseling and coordinating the care for this patient.  Greater than 50% of the time was spent in direct face-to-face contact.    Recommendations:  Weekly fetal testing and umbilical artery Doppler studies Continue labetalol for blood pressure control Delivery at between 36 to 37 weeks should IUGR continue to be noted in twin B

## 2021-03-31 ENCOUNTER — Inpatient Hospital Stay: Payer: Medicaid Other | Attending: Hematology | Admitting: Hematology

## 2021-03-31 ENCOUNTER — Inpatient Hospital Stay: Payer: Medicaid Other

## 2021-04-05 DIAGNOSIS — Z419 Encounter for procedure for purposes other than remedying health state, unspecified: Secondary | ICD-10-CM | POA: Diagnosis not present

## 2021-04-06 ENCOUNTER — Encounter: Payer: Self-pay | Admitting: *Deleted

## 2021-04-06 ENCOUNTER — Ambulatory Visit: Payer: Medicaid Other | Admitting: *Deleted

## 2021-04-06 ENCOUNTER — Ambulatory Visit: Payer: Medicaid Other | Attending: Obstetrics

## 2021-04-06 ENCOUNTER — Other Ambulatory Visit: Payer: Self-pay

## 2021-04-06 VITALS — BP 121/75 | HR 96

## 2021-04-06 DIAGNOSIS — Z3A31 31 weeks gestation of pregnancy: Secondary | ICD-10-CM | POA: Diagnosis not present

## 2021-04-06 DIAGNOSIS — O30043 Twin pregnancy, dichorionic/diamniotic, third trimester: Secondary | ICD-10-CM | POA: Insufficient documentation

## 2021-04-06 DIAGNOSIS — O10913 Unspecified pre-existing hypertension complicating pregnancy, third trimester: Secondary | ICD-10-CM | POA: Diagnosis not present

## 2021-04-06 DIAGNOSIS — O365932 Maternal care for other known or suspected poor fetal growth, third trimester, fetus 2: Secondary | ICD-10-CM | POA: Diagnosis not present

## 2021-04-06 DIAGNOSIS — O10013 Pre-existing essential hypertension complicating pregnancy, third trimester: Secondary | ICD-10-CM | POA: Diagnosis not present

## 2021-04-12 DIAGNOSIS — K8020 Calculus of gallbladder without cholecystitis without obstruction: Secondary | ICD-10-CM | POA: Diagnosis not present

## 2021-04-12 DIAGNOSIS — O30009 Twin pregnancy, unspecified number of placenta and unspecified number of amniotic sacs, unspecified trimester: Secondary | ICD-10-CM | POA: Diagnosis not present

## 2021-04-12 DIAGNOSIS — Z362 Encounter for other antenatal screening follow-up: Secondary | ICD-10-CM | POA: Diagnosis not present

## 2021-04-12 DIAGNOSIS — O365999 Maternal care for other known or suspected poor fetal growth, unspecified trimester, other fetus: Secondary | ICD-10-CM | POA: Diagnosis not present

## 2021-04-12 DIAGNOSIS — O321XX9 Maternal care for breech presentation, other fetus: Secondary | ICD-10-CM | POA: Diagnosis not present

## 2021-04-12 DIAGNOSIS — Z331 Pregnant state, incidental: Secondary | ICD-10-CM | POA: Diagnosis not present

## 2021-04-12 DIAGNOSIS — R7401 Elevation of levels of liver transaminase levels: Secondary | ICD-10-CM | POA: Diagnosis not present

## 2021-04-12 DIAGNOSIS — Z113 Encounter for screening for infections with a predominantly sexual mode of transmission: Secondary | ICD-10-CM | POA: Diagnosis not present

## 2021-04-12 DIAGNOSIS — Z8759 Personal history of other complications of pregnancy, childbirth and the puerperium: Secondary | ICD-10-CM | POA: Diagnosis not present

## 2021-04-12 DIAGNOSIS — O1413 Severe pre-eclampsia, third trimester: Secondary | ICD-10-CM | POA: Diagnosis not present

## 2021-04-12 DIAGNOSIS — Z91410 Personal history of adult physical and sexual abuse: Secondary | ICD-10-CM | POA: Diagnosis not present

## 2021-04-12 DIAGNOSIS — R000 Tachycardia, unspecified: Secondary | ICD-10-CM | POA: Diagnosis not present

## 2021-04-14 ENCOUNTER — Ambulatory Visit: Payer: Medicaid Other | Attending: Obstetrics

## 2021-04-14 ENCOUNTER — Ambulatory Visit: Payer: Medicaid Other

## 2021-04-20 ENCOUNTER — Ambulatory Visit: Payer: Medicaid Other | Admitting: *Deleted

## 2021-04-20 ENCOUNTER — Other Ambulatory Visit: Payer: Self-pay

## 2021-04-20 ENCOUNTER — Ambulatory Visit: Payer: Medicaid Other | Attending: Obstetrics

## 2021-04-20 ENCOUNTER — Encounter: Payer: Self-pay | Admitting: *Deleted

## 2021-04-20 ENCOUNTER — Ambulatory Visit (HOSPITAL_BASED_OUTPATIENT_CLINIC_OR_DEPARTMENT_OTHER): Payer: Medicaid Other | Admitting: Obstetrics

## 2021-04-20 VITALS — BP 125/75 | HR 110

## 2021-04-20 DIAGNOSIS — O10913 Unspecified pre-existing hypertension complicating pregnancy, third trimester: Secondary | ICD-10-CM | POA: Diagnosis not present

## 2021-04-20 DIAGNOSIS — Z3A33 33 weeks gestation of pregnancy: Secondary | ICD-10-CM

## 2021-04-20 DIAGNOSIS — O30043 Twin pregnancy, dichorionic/diamniotic, third trimester: Secondary | ICD-10-CM

## 2021-04-20 DIAGNOSIS — O365932 Maternal care for other known or suspected poor fetal growth, third trimester, fetus 2: Secondary | ICD-10-CM | POA: Diagnosis not present

## 2021-04-20 DIAGNOSIS — O365931 Maternal care for other known or suspected poor fetal growth, third trimester, fetus 1: Secondary | ICD-10-CM

## 2021-04-20 NOTE — Progress Notes (Signed)
MFM Note  Samantha Alexander was seen due to IUGR of twin B in a dichorionic, diamniotic twin gestation.  Her pregnancy has also been complicated by chronic hypertension treated with labetalol.  She denies any problems since her last exam and reports feeling fetal movements of both fetuses throughout the day.  Twin A: EFW 4 pounds 2 ounces (12th percentile for her gestational age). Normal amniotic fluid.  Doppler studies of the umbilical arteries showed continued normal forward flow.  There were no signs of absent or reversed end-diastolic flow noted.    Twin B: EFW 2 pounds 13 ounces (1st percentile for her gestational age, indicating IUGR).  Normal amniotic fluid.  Doppler studies of the umbilical arteries showed continued normal forward flow.  There were no signs of absent or reversed end-diastolic flow noted.    The growth discordance between the two fetuses was 16%.  Both fetuses have grown over half a pound over the past 3 weeks.  A biophysical profile performed today was 8 out of 8 for both twin A and twin B.  Due to severe IUGR of twin B in a dichorionic twin gestation, delivery is recommended at around 75 weeks.   As she will require a late preterm birth, the option to receive a course of antenatal corticosteroids was discussed today.  She was advised that a course of antenatal corticosteroids is helpful to decrease the risk of short-term respiratory morbidity.  However, their effects on long-term neurodevelopmental outcomes are inconsistent. She will consider this option and will inform us of her decision at her future visit.  If she decides not to receive a course of steroids prior to delivery, it is probably not a big deal.  We will continue to follow her with weekly BPP's and umbilical artery Doppler studies until delivery.    Fetal kick count instructions were reviewed.  A total of 20 minutes was spent counseling and coordinating the care for this patient.  Greater than 50% of the  time was spent in direct face-to-face contact.

## 2021-04-25 ENCOUNTER — Other Ambulatory Visit: Payer: Self-pay | Admitting: Obstetrics & Gynecology

## 2021-04-26 ENCOUNTER — Other Ambulatory Visit: Payer: Self-pay | Admitting: *Deleted

## 2021-04-26 ENCOUNTER — Ambulatory Visit: Payer: Medicaid Other | Admitting: *Deleted

## 2021-04-26 ENCOUNTER — Ambulatory Visit (HOSPITAL_BASED_OUTPATIENT_CLINIC_OR_DEPARTMENT_OTHER): Payer: Medicaid Other | Admitting: Obstetrics and Gynecology

## 2021-04-26 ENCOUNTER — Ambulatory Visit: Payer: Medicaid Other | Attending: Obstetrics

## 2021-04-26 ENCOUNTER — Other Ambulatory Visit: Payer: Self-pay

## 2021-04-26 ENCOUNTER — Encounter: Payer: Self-pay | Admitting: *Deleted

## 2021-04-26 ENCOUNTER — Other Ambulatory Visit: Payer: Self-pay | Admitting: Obstetrics

## 2021-04-26 VITALS — BP 110/67 | HR 104

## 2021-04-26 DIAGNOSIS — O99213 Obesity complicating pregnancy, third trimester: Secondary | ICD-10-CM | POA: Insufficient documentation

## 2021-04-26 DIAGNOSIS — O30043 Twin pregnancy, dichorionic/diamniotic, third trimester: Secondary | ICD-10-CM

## 2021-04-26 DIAGNOSIS — O365932 Maternal care for other known or suspected poor fetal growth, third trimester, fetus 2: Secondary | ICD-10-CM

## 2021-04-26 DIAGNOSIS — Z3A34 34 weeks gestation of pregnancy: Secondary | ICD-10-CM | POA: Diagnosis not present

## 2021-04-26 DIAGNOSIS — O10013 Pre-existing essential hypertension complicating pregnancy, third trimester: Secondary | ICD-10-CM | POA: Diagnosis not present

## 2021-04-26 DIAGNOSIS — O10913 Unspecified pre-existing hypertension complicating pregnancy, third trimester: Secondary | ICD-10-CM

## 2021-04-26 NOTE — Progress Notes (Signed)
°  Maternal-Fetal Medicine   Name: Samantha Alexander DOB: 29-Jul-1990 MRN: [id number] Referring Provider: Hoover Browns, MD  I had the [facility] of [facility]. She is G2 P1 at 93-weeks' gestation with dichorionic-diamniotic twin pregnancy with selective fetal growth restriction in twin B. On ultrasound performed last week, the estimated fetal weight of twin A is at the 12th percentile and that of twin B was at the 1st percentile.  Patient has chronic hypertension and takes labetalol.  Blood pressure today at her office is 110/67 mmHg.  Patient does not have symptoms and signs of severe features of preeclampsia. She does not have gestational diabetes. Obstetric history is significant for a term vaginal delivery.  Twin A: Maternal right, breech presentation, anterior placenta, female fetus.  Amniotic fluid is normal and good fetal activity seen.  Antenatal testing is reassuring.  Umbilical artery Doppler showed normal forward diastolic flow. NST is reactive. BPP 10/10.  Twin B: Maternal left, breech presentation, left lateral placenta, female fetus. Amniotic fluid is normal and good fetal activity seen.  Antenatal testing is reassuring.  Umbilical artery Doppler showed normal forward diastolic flow. NST is reactive. BPP 10/10.  I discussed timing of delivery.  Patient has twin pregnancy complicated by selective fetal growth restriction.  She has a concurrent condition of hypertension.  Interval fetal weight gains in both twins were suboptimal on growth assessment performed last week.  Based on ACOG guidelines (Committee Opinion, July 2021), considering twin pregnancy with fetal growth restriction and maternal hypertension, I recommend delivery at 20 weeks' gestation. I counseled the patient that delivery at 36 weeks' gestation increases the likelihood of NICU care for both infants, but decreases the chances of stillbirth. Patient will discuss with you and  decide.  Recommendations -BPP and UA Doppler next week. -Delivery at 36 weeks' gestation. -If patient chooses to deliver at 13 weeks' gestation, I recommend NST between 44 and 37 weeks.   Thank you for consultation.  If you have any questions or concerns, please contact me the [facility].  Consultation including face-to-face (more than 50%) counseling 30 minutes.

## 2021-04-26 NOTE — Procedures (Signed)
Samantha Alexander January 16, 1991 [redacted]w[redacted]d  Fetus A Non-Stress Test Interpretation for 04/26/21  Indication:  Di/Di twins  Fetal Heart Rate A Mode: External Baseline Rate (A): 140 bpm Variability: Moderate Accelerations: 15 x 15 Decelerations: None Multiple birth?: Yes  Uterine Activity Mode: Palpation, Toco Contraction Frequency (min): Occas Contraction Quality: Mild Resting Tone Palpated: Relaxed Resting Time: Adequate  Interpretation (Fetal Testing) Nonstress Test Interpretation: Reactive Comments: Dr. Donalee Citrin reviewed tracing. Tracing is poor quality. Attempted to hold cardio on Baby B. Unable to maintain continuous tracing on Baby B.  Samantha Alexander 11/04/1990 [redacted]w[redacted]d   Fetus B Non-Stress Test Interpretation for 04/26/21  Indication: IUGR  Fetal Heart Rate Fetus B Mode: External Baseline Rate (B): 135 BPM Variability: Moderate Accelerations: 15 x 15 Decelerations: None  Uterine Activity Mode: Palpation, Toco Contraction Frequency (min): Occas Contraction Quality: Mild Resting Tone Palpated: Relaxed Resting Time: Adequate

## 2021-04-27 ENCOUNTER — Encounter: Payer: Self-pay | Admitting: Cardiology

## 2021-04-27 ENCOUNTER — Ambulatory Visit (INDEPENDENT_AMBULATORY_CARE_PROVIDER_SITE_OTHER): Payer: Medicaid Other | Admitting: Cardiology

## 2021-04-27 VITALS — BP 122/66 | HR 103 | Ht 69.0 in | Wt 245.0 lb

## 2021-04-27 DIAGNOSIS — O10919 Unspecified pre-existing hypertension complicating pregnancy, unspecified trimester: Secondary | ICD-10-CM | POA: Diagnosis not present

## 2021-04-27 DIAGNOSIS — Z8759 Personal history of other complications of pregnancy, childbirth and the puerperium: Secondary | ICD-10-CM | POA: Diagnosis not present

## 2021-04-27 DIAGNOSIS — O99210 Obesity complicating pregnancy, unspecified trimester: Secondary | ICD-10-CM

## 2021-04-27 NOTE — Progress Notes (Signed)
Cardio-Obstetrics Clinic  Follow Up Note   Date:  04/28/2021   ID:  Samantha Alexander, DOB 1990/06/10, MRN [id number]  PCP:  Patient, No Pcp Per (Inactive)   [facility] Providers Cardiologist:  Thomasene Ripple, DO  Electrophysiologist:  None        Referring MD: No ref. provider found   Chief Complaint: " I am doing much better"  History of Present Illness:    Samantha Alexander is a 31 y.o. female [G2P1001] who returns for follow up for chronic hypertension in pregnancy.  She has a history of chronic anemia, and during this pregnancy has been receiving iron infusions, chronic hypertension in pregnancy, preeclampsia.  I first saw the patient on February 15, 2021 at that time she was experiencing significant shortness of breath and was really concerned.  She also has had elevated blood pressure by the time I saw her she had been on labetalol.  The day of her visit her blood pressure was at target so I continue her medication.  I repeated her echocardiogram because 2020 she had a nuclear stress test showing a low normal EF.  In addition a monitor was placed on the patient.  In the interim she has been able to wear her monitor with did not show any evidence of arrhythmia, she also did have the echocardiogram which was normal function.  She is continue to take her medication at home she tells me her blood pressure usually stays less than 140/90 millimeters mercury.  She is really happy because her shortness of breath has improved some and still has her palpitations.   Prior CV Studies Reviewed: The following studies were reviewed today:  TTE 03/13/2021 FINDINGS   Left Ventricle: Left ventricular ejection fraction, by estimation, is 70  to 75%. Left ventricular ejection fraction by 3D volume is 71 %. The left  ventricle has hyperdynamic function. The left ventricle has no regional  wall motion abnormalities. The  left ventricular internal cavity size was normal in size. There is no  left  ventricular hypertrophy. Left ventricular diastolic parameters were  normal.   Right Ventricle: The right ventricular size is normal. No increase in  right ventricular wall thickness. Right ventricular systolic function is  normal. There is normal pulmonary artery systolic pressure. The tricuspid  regurgitant velocity is 2.08 m/s, and   with an assumed right atrial pressure of 3 mmHg, the estimated right  ventricular systolic pressure is 20.3 mmHg.   Left Atrium: Left atrial size was normal in size.   Right Atrium: Right atrial size was normal in size.   Pericardium: There is no evidence of pericardial effusion.   Mitral Valve: The mitral valve is normal in structure. No evidence of  mitral valve regurgitation. No evidence of mitral valve stenosis.   Tricuspid Valve: The tricuspid valve is normal in structure. Tricuspid  valve regurgitation is not demonstrated. No evidence of tricuspid  stenosis.   Aortic Valve: The aortic valve is tricuspid. Aortic valve regurgitation is  not visualized. No aortic stenosis is present.   Pulmonic Valve: The pulmonic valve was not well visualized. Pulmonic valve  regurgitation is not visualized. No evidence of pulmonic stenosis.   Aorta: The aortic root and ascending aorta are structurally normal, with  no evidence of dilitation.   Venous: The inferior vena cava is normal in size with greater than 50%  respiratory variability, suggesting right atrial pressure of 3 mmHg.   IAS/Shunts: No atrial level shunt detected by color flow Doppler.  Zio monitor  Patch Wear Time:  14 days and 0 hours (2022-12-19T09:25:49-0500 to 2023-01-02T09:25:53-0500)   Patient had a min HR of 62 bpm, max HR of 152 bpm, and avg HR of 99 bpm. Predominant underlying rhythm was Sinus Rhythm. First Degree AV Block was present. Isolated SVEs were rare (<1.0%), SVE Couplets were rare (<1.0%), and no SVE Triplets were present.  Isolated VEs were rare (<1.0%), and no VE  Couplets or VE Triplets were present.   Symptoms associated with sinus rhythm and sinus tachycardia.   Conclusion: Normal/unremarkable study with no evidence of arrhythmia.  Past Medical History:  Diagnosis Date   Abscess of vulva    Anemia    Headache    HPV (human papilloma virus) anogenital infection    Pregnancy induced hypertension     Past Surgical History:  Procedure Laterality Date   hemorrhoid ligation     WISDOM TOOTH EXTRACTION        OB History     Gravida  2   Para  1   Term  1   Preterm      AB      Living  1      SAB      IAB      Ectopic      Multiple  0   Live Births  1               Current Medications: Current Meds  Medication Sig   BABY ASPIRIN PO Take 81 mg by mouth daily.   cholecalciferol (VITAMIN D3) 25 MCG (1000 UNIT) tablet Take 1,000 Units by mouth daily.   Cholecalciferol (VITAMIN D3) 50 MCG (2000 UT) capsule TAKE 2 CAPSULES BY MOUTH ONCE DAILY   Doxylamine-Pyridoxine 10-10 MG TBEC Diclegis 10 mg-10 mg tablet,delayed release  Take 1 tablet twice a day by oral route.   iron polysaccharides (NIFEREX) 150 MG capsule Take 1 capsule (150 mg total) by mouth daily.   labetalol (NORMODYNE) 100 MG tablet Take 100 mg by mouth 2 (two) times daily.   omeprazole (PRILOSEC) 10 MG capsule Take 10 mg by mouth daily.   ondansetron (ZOFRAN) 4 MG tablet Take 4 mg by mouth 2 (two) times daily.   Prenatal Vit-Fe Fumarate-FA (PREPLUS) 27-1 MG TABS Take by mouth.   prochlorperazine (COMPAZINE) 10 MG tablet Take 1 tablet (10 mg total) by mouth every 6 (six) hours as needed for nausea or vomiting.   promethazine (PHENERGAN) 25 MG tablet Take 25 mg by mouth every 6 (six) hours as needed for nausea or vomiting.   scopolamine (TRANSDERM-SCOP) 1 MG/3DAYS Place 1 patch (1.5 mg total) onto the skin every 3 (three) days.   terconazole (TERAZOL 7) 0.4 % vaginal cream INSERT 1 APPLICATORFUL VAGINALLY  AT BEDTIME FOR 7 DAYS     Allergies:   Fentanyl    Social History   Socioeconomic History   Marital status: Single    Spouse name: Not on file   Number of children: Not on file   Years of education: Not on file   Highest education level: Not on file  Occupational History   Not on file  Tobacco Use   Smoking status: Never   Smokeless tobacco: Never  Vaping Use   Vaping Use: Never used  Substance and Sexual Activity   Alcohol use: Never   Drug use: Never   Sexual activity: Not Currently  Other Topics Concern   Not on file  Social History Narrative   Not on  file   Social Determinants of Health   Financial Resource Strain: Not on file  Food Insecurity: Not on file  Transportation Needs: Not on file  Physical Activity: Not on file  Stress: Not on file  Social Connections: Not on file      Family History  Problem Relation Age of Onset   Hypertension Mother    Diabetes Mother    Hypertension Father    Diabetes Father    Diabetes Sister    Lupus Sister       ROS:   Please see the history of present illness.     All other systems reviewed and are negative.   Labs/EKG Reviewed:    EKG:   EKG is was not ordered today.    Recent Labs: 02/02/2021: ALT 74; BUN 8; Creatinine 0.70; Hemoglobin 8.3; Platelet Count 352; Potassium 3.7; Sodium 137   Recent Lipid Panel No results found for: CHOL, TRIG, HDL, CHOLHDL, LDLCALC, LDLDIRECT  Physical Exam:    VS:  BP 122/66    Pulse (!) 103    Ht 5\' 9"  (1.753 m)    Wt 245 lb (111.1 kg)    LMP 08/28/2020 (Exact Date)    SpO2 99%    BMI 36.18 kg/m     Wt Readings from Last 3 Encounters:  04/27/21 245 lb (111.1 kg)  03/20/21 242 lb (109.8 kg)  02/23/21 232 lb (105.2 kg)     GEN:  Well nourished, well developed in no acute distress HEENT: Normal NECK: No JVD; No carotid bruits LYMPHATICS: No lymphadenopathy CARDIAC: RRR, no murmurs, rubs, gallops RESPIRATORY:  Clear to auscultation without rales, wheezing or rhonchi  ABDOMEN: Soft, non-tender,  non-distended MUSCULOSKELETAL:  No edema; No deformity  SKIN: Warm and dry NEUROLOGIC:  Alert and oriented x 3 PSYCHIATRIC:  Normal affect    Risk Assessment/Risk Calculators:     CARPREG II Risk Prediction Index Score:  1.  The patient's risk for a primary cardiac event is 5%.            ASSESSMENT & PLAN:    Chronic hypertension pregnancy Obesity in pregnancy History of preeclampsia  Her blood appeared to be within normal limits.  We will continue her current medication regimen.  She is still on the aspirin 81 mg daily.  The patient understands the need to lose weight with diet and exercise. We have discussed specific strategies for this.    Patient Instructions  Medication Instructions:  Your physician recommends that you continue on your current medications as directed. Please refer to the Current Medication list given to you today.  *If you need a refill on your cardiac medications before your next appointment, please call your pharmacy*   Lab Work: None If you have labs (blood work) drawn today and your tests are completely normal, you will receive your results only by: MyChart Message (if you have MyChart) OR A paper copy in the mail If you have any lab test that is abnormal or we need to change your treatment, we will call you to review the results.   Testing/Procedures: None   Follow-Up: [facility], you and your health needs are our priority.  As part of our continuing mission to provide you with exceptional heart care, we have created designated Provider Care Teams.  These Care Teams include your primary Cardiologist (physician) and Advanced Practice Providers (APPs -  Physician Assistants and Nurse Practitioners) who all work together to provide you with the care you need, when  you need it.  We recommend signing up for the patient portal called "MyChart".  Sign up information is provided on this After Visit Summary.  MyChart is used to connect  with patients for Virtual Visits (Telemedicine).  Patients are able to view lab/test results, encounter notes, upcoming appointments, etc.  Non-urgent messages can be sent to your provider as well.   To learn more about what you can do with MyChart, go to [email or web address].    Your next appointment:   Week of March 20th  The format for your next appointment:   Virtual Visit   Provider:   Thomasene Ripple, DO     Other Instructions     Dispo:  No follow-ups on file.   Medication Adjustments/Labs and Tests Ordered: Current medicines are reviewed at length with the patient today.  Concerns regarding medicines are outlined above.  Tests Ordered: No orders of the defined types were placed in this encounter.  Medication Changes: No orders of the defined types were placed in this encounter.

## 2021-04-27 NOTE — Patient Instructions (Addendum)
Medication Instructions:  Your physician recommends that you continue on your current medications as directed. Please refer to the Current Medication list given to you today.  *If you need a refill on your cardiac medications before your next appointment, please call your pharmacy*   Lab Work: None If you have labs (blood work) drawn today and your tests are completely normal, you will receive your results only by: MyChart Message (if you have MyChart) OR A paper copy in the mail If you have any lab test that is abnormal or we need to change your treatment, we will call you to review the results.   Testing/Procedures: None   Follow-Up: At [facility], you and your health needs are our priority.  As part of our continuing mission to provide you with exceptional heart care, we have created designated Provider Care Teams.  These Care Teams include your primary Cardiologist (physician) and Advanced Practice Providers (APPs -  Physician Assistants and Nurse Practitioners) who all work together to provide you with the care you need, when you need it.  We recommend signing up for the patient portal called "MyChart".  Sign up information is provided on this After Visit Summary.  MyChart is used to connect with patients for Virtual Visits (Telemedicine).  Patients are able to view lab/test results, encounter notes, upcoming appointments, etc.  Non-urgent messages can be sent to your provider as well.   To learn more about what you can do with MyChart, go to [email or web address].    Your next appointment:   Week of March 20th  The format for your next appointment:   Virtual Visit   Provider:   Thomasene Ripple, DO     Other Instructions

## 2021-04-28 ENCOUNTER — Other Ambulatory Visit: Payer: Self-pay | Admitting: *Deleted

## 2021-04-28 DIAGNOSIS — O09299 Supervision of pregnancy with other poor reproductive or obstetric history, unspecified trimester: Secondary | ICD-10-CM

## 2021-04-28 DIAGNOSIS — O365932 Maternal care for other known or suspected poor fetal growth, third trimester, fetus 2: Secondary | ICD-10-CM

## 2021-04-28 DIAGNOSIS — O10913 Unspecified pre-existing hypertension complicating pregnancy, third trimester: Secondary | ICD-10-CM

## 2021-04-28 DIAGNOSIS — O30043 Twin pregnancy, dichorionic/diamniotic, third trimester: Secondary | ICD-10-CM

## 2021-04-28 DIAGNOSIS — O99213 Obesity complicating pregnancy, third trimester: Secondary | ICD-10-CM

## 2021-05-02 ENCOUNTER — Inpatient Hospital Stay (HOSPITAL_COMMUNITY)
Admission: AD | Admit: 2021-05-02 | Discharge: 2021-05-02 | Disposition: A | Discharge status: Home / Self Care | Payer: Medicaid Other | Attending: Obstetrics & Gynecology | Admitting: Obstetrics & Gynecology

## 2021-05-02 ENCOUNTER — Other Ambulatory Visit: Payer: Self-pay

## 2021-05-02 DIAGNOSIS — O26893 Other specified pregnancy related conditions, third trimester: Secondary | ICD-10-CM | POA: Diagnosis not present

## 2021-05-02 MED ORDER — BETAMETHASONE SOD PHOS & ACET 6 (3-3) MG/ML IJ SUSP
12.0000 mg | Freq: Once | INTRAMUSCULAR | Status: AC
  Administered 2021-05-02: 12 mg via INTRAMUSCULAR
  Filled 2021-05-02: qty 5

## 2021-05-02 NOTE — MAU Note (Signed)
Samantha Alexander is a 31 y.o. at [redacted]w[redacted]d here in MAU reporting: here for BMZ, states she is pregnant with twins and they are measuring small. Planning to deliver early. Denies contractions, bleeding, or LOF. +FM x 2  Verbal orders for BMZ were obtained from Dr Sallye Ober  Onset of complaint: today  Pain score: 0/10  Vitals:   05/02/21 1605  BP: 131/82  Pulse: 100  Resp: 18  Temp: 98.4 F (36.9 C)  SpO2: 100%     RAQ:TMAU A 133, Baby B 138  Lab orders placed from triage: none

## 2021-05-02 NOTE — MAU Provider Note (Signed)
Patient ID: Samantha Alexander, female   DOB: 10-16-1990, 31 y.o.   MRN: [id number]  [name] is a 31 y.o. G2P1001 at [redacted]w[redacted]d who presents to MAU today for 1st dose of BMZ. She is doing well, denies any complaints.   Patient Vitals for the past 24 hrs:  BP Temp Temp src Pulse Resp SpO2  05/02/21 1605 131/82 98.4 F (36.9 C) Oral 100 18 100 %   She will return for 2nd dose of BMZ tomorrow at 4 pm.  Return precautions given.   Luna Kitchens, CNM

## 2021-05-03 ENCOUNTER — Other Ambulatory Visit: Payer: Self-pay

## 2021-05-03 ENCOUNTER — Ambulatory Visit: Payer: Medicaid Other

## 2021-05-03 ENCOUNTER — Inpatient Hospital Stay (HOSPITAL_COMMUNITY)
Admission: AD | Admit: 2021-05-03 | Discharge: 2021-05-03 | Disposition: A | Discharge status: Home / Self Care | Payer: Medicaid Other | Attending: Obstetrics & Gynecology | Admitting: Obstetrics & Gynecology

## 2021-05-03 ENCOUNTER — Encounter (HOSPITAL_COMMUNITY): Payer: Self-pay | Admitting: Obstetrics & Gynecology

## 2021-05-03 DIAGNOSIS — O26893 Other specified pregnancy related conditions, third trimester: Secondary | ICD-10-CM | POA: Insufficient documentation

## 2021-05-03 DIAGNOSIS — Z419 Encounter for procedure for purposes other than remedying health state, unspecified: Secondary | ICD-10-CM | POA: Diagnosis not present

## 2021-05-03 DIAGNOSIS — Z3A35 35 weeks gestation of pregnancy: Secondary | ICD-10-CM

## 2021-05-03 DIAGNOSIS — O368190 Decreased fetal movements, unspecified trimester, not applicable or unspecified: Secondary | ICD-10-CM | POA: Insufficient documentation

## 2021-05-03 LAB — URINALYSIS, ROUTINE W REFLEX MICROSCOPIC
Bilirubin Urine: NEGATIVE
Glucose, UA: NEGATIVE mg/dL
Hgb urine dipstick: NEGATIVE
Ketones, ur: NEGATIVE mg/dL
Leukocytes,Ua: NEGATIVE
Nitrite: NEGATIVE
Protein, ur: NEGATIVE mg/dL
Specific Gravity, Urine: 1.008 (ref 1.005–1.030)
pH: 6 (ref 5.0–8.0)

## 2021-05-03 MED ORDER — BETAMETHASONE SOD PHOS & ACET 6 (3-3) MG/ML IJ SUSP
12.0000 mg | Freq: Once | INTRAMUSCULAR | Status: AC
  Administered 2021-05-03: 12 mg via INTRAMUSCULAR

## 2021-05-03 NOTE — Patient Instructions (Addendum)
Marytza Grandpre ? 05/03/2021 ? ? Your procedure is scheduled on:  05/10/2021 ? Arrive at [facility] [employer] on [address] at [facility]  and [address]. You are invited to use the FREE valet parking or use the Visitor's parking deck. ? Pick up the phone at the desk and dial [phone]. ? Call this number if you have problems the morning of surgery: [phone] ? Remember: ? ? Do not eat food:(After Midnight) Desp?s de medianoche. ? Do not drink clear liquids: (6 Hours before arrival) 6 horas ante llegada. ? Take these medicines the morning of surgery with A SIP OF WATER:  Take labetalol as prescribed.  May take phenergan and omeprozole if needed ? ? Do not wear jewelry, make-up or nail polish. ? Do not wear lotions, powders, or perfumes. Do not wear deodorant. ? Do not shave 48 hours prior to surgery. ? Do not bring valuables to the hospital.  [facility] is not  ? responsible for any belongings or valuables brought to the hospital. ? Contacts, dentures or bridgework may not be worn into surgery. ? Leave suitcase in the car. After surgery it may be brought to your room. ? For patients admitted to the hospital, checkout time is 11:00 AM the day of  ?            discharge. ? ?   ? Please read over the following fact sheets that you were given:  ?   Preparing for Surgery ? ? ?

## 2021-05-03 NOTE — MAU Provider Note (Signed)
?Chief Complaint: Contractions and Decreased Fetal Movement ? ?SUBJECTIVE ?HPI: Samantha Alexander is a 31 y.o. G2P1001 who presents to maternity admissions for her 2nd betamethasone injection. She reports decreased fetal movement, however felt movement as soon as the fetal monitors were applied. She reports an occasional contraction which is new.  ? ?Past Medical History:  ?Diagnosis Date  ? Abscess of vulva   ? Anemia   ? Headache   ? HPV (human papilloma virus) anogenital infection   ? Pregnancy induced hypertension   ? ?Past Surgical History:  ?Procedure Laterality Date  ? hemorrhoid ligation    ? WISDOM TOOTH EXTRACTION    ? ?Social History  ? ?Socioeconomic History  ? Marital status: Single  ?  Spouse name: Not on file  ? Number of children: Not on file  ? Years of education: Not on file  ? Highest education level: Not on file  ?Occupational History  ? Not on file  ?Tobacco Use  ? Smoking status: Never  ? Smokeless tobacco: Never  ?Vaping Use  ? Vaping Use: Never used  ?Substance and Sexual Activity  ? Alcohol use: Never  ? Drug use: Never  ? Sexual activity: Not Currently  ?Other Topics Concern  ? Not on file  ?Social History Narrative  ? Not on file  ? ?Social Determinants of Health  ? ?Financial Resource Strain: Not on file  ?Food Insecurity: Not on file  ?Transportation Needs: Not on file  ?Physical Activity: Not on file  ?Stress: Not on file  ?Social Connections: Not on file  ?Intimate Partner Violence: Not on file  ? ?No current facility-administered medications on file prior to encounter.  ? ?Current Outpatient Medications on File Prior to Encounter  ?Medication Sig Dispense Refill  ? BABY ASPIRIN PO Take 81 mg by mouth daily.    ? cholecalciferol (VITAMIN D3) 25 MCG (1000 UNIT) tablet Take 1,000 Units by mouth daily.    ? Cholecalciferol (VITAMIN D3) 50 MCG (2000 UT) capsule TAKE 2 CAPSULES BY MOUTH ONCE DAILY    ? Doxylamine-Pyridoxine 10-10 MG TBEC Diclegis 10 mg-10 mg tablet,delayed release ? Take 1  tablet twice a day by oral route.    ? iron polysaccharides (NIFEREX) 150 MG capsule Take 1 capsule (150 mg total) by mouth daily. 30 capsule 5  ? labetalol (NORMODYNE) 100 MG tablet Take 100 mg by mouth 2 (two) times daily.    ? omeprazole (PRILOSEC) 10 MG capsule Take 10 mg by mouth daily.    ? ondansetron (ZOFRAN) 4 MG tablet Take 4 mg by mouth 2 (two) times daily.    ? Prenatal Vit-Fe Fumarate-FA (PREPLUS) 27-1 MG TABS Take by mouth.    ? prochlorperazine (COMPAZINE) 10 MG tablet Take 1 tablet (10 mg total) by mouth every 6 (six) hours as needed for nausea or vomiting. 30 tablet 0  ? promethazine (PHENERGAN) 25 MG tablet Take 25 mg by mouth every 6 (six) hours as needed for nausea or vomiting.    ? scopolamine (TRANSDERM-SCOP) 1 MG/3DAYS Place 1 patch (1.5 mg total) onto the skin every 3 (three) days. 10 patch 12  ? terconazole (TERAZOL 7) 0.4 % vaginal cream INSERT 1 APPLICATORFUL VAGINALLY  AT BEDTIME FOR 7 DAYS 45 g 0  ? ?Allergies  ?Allergen Reactions  ? Fentanyl Itching  ? ? ?ROS:  ?Review of Systems  ?Gastrointestinal:  Positive for abdominal pain.  ?Genitourinary:  Negative for vaginal bleeding and vaginal discharge.  ? ?I have reviewed patient's Past Medical Hx,  Surgical Hx, Family Hx, Social Hx, medications and allergies.  ? ?Physical Exam  ?Patient Vitals for the past 24 hrs: ? BP Temp Temp src Pulse Resp SpO2  ?05/03/21 1635 128/74 98.5 ?F (36.9 ?C) Oral 93 18 100 %  ? ?Physical Exam ?Constitutional:   ?   General: She is not in acute distress. ?   Appearance: Normal appearance. She is not ill-appearing, toxic-appearing or diaphoretic.  ?Genitourinary: ?   Comments: Cervix: FT, Soft, posterior. ?Exam by Venia Carbon, NP  ?Neurological:  ?   Mental Status: She is alert and oriented to person, place, and time.  ?Psychiatric:     ?   Behavior: Behavior normal.  ? ?Fetal Tracing Fetus A ?Baseline: 135 bpm ?Variability: Moderate  ?Accelerations: 15x15 ?Decelerations: None ?Toco: UI ? ?Fetal Tracing Fetus  B ?Baseline: 130 bpm ?Variability: Moderate  ?Accelerations: 15x15 ?Decelerations: None ?  ? ?MDM ? ?BMZ given ?NST reactive x 2 ?Cervix FT, soft, posterior.  ? ?ASSESSMENT ?MSE Complete ?BMZ given ? ?PLAN ? ?Preterm labor precautions ?Go to your scheduled OB appointment tomorrow.  ? ?Duane Lope, NP ?05/03/2021 5:19 PM   ?

## 2021-05-03 NOTE — MAU Note (Signed)
Samantha Alexander is a 31 y.o. at [redacted]w[redacted]d here in MAU reporting: here for 2nd BMZ. States today she has been having some contractions, currently they are about every 10 min. No bleeding or LOF. DFM. ? ?Onset of complaint: today ? ?Pain score: 4/10 ? ?Vitals:  ? 05/03/21 1635  ?BP: 128/74  ?Pulse: 93  ?Resp: 18  ?Temp: 98.5 ?F (36.9 ?C)  ?SpO2: 100%  ?   ?KPQ:AESL A 143, Baby B 154 ? ?Lab orders placed from triage: UA ? ?

## 2021-05-04 ENCOUNTER — Ambulatory Visit (HOSPITAL_BASED_OUTPATIENT_CLINIC_OR_DEPARTMENT_OTHER): Payer: Medicaid Other | Admitting: *Deleted

## 2021-05-04 ENCOUNTER — Telehealth (HOSPITAL_COMMUNITY): Payer: Self-pay | Admitting: *Deleted

## 2021-05-04 ENCOUNTER — Ambulatory Visit: Payer: Medicaid Other | Admitting: *Deleted

## 2021-05-04 ENCOUNTER — Ambulatory Visit: Payer: Medicaid Other | Attending: Obstetrics

## 2021-05-04 ENCOUNTER — Other Ambulatory Visit: Payer: Self-pay | Admitting: *Deleted

## 2021-05-04 VITALS — BP 129/59 | HR 73

## 2021-05-04 DIAGNOSIS — Z3A35 35 weeks gestation of pregnancy: Secondary | ICD-10-CM

## 2021-05-04 DIAGNOSIS — O10013 Pre-existing essential hypertension complicating pregnancy, third trimester: Secondary | ICD-10-CM

## 2021-05-04 DIAGNOSIS — O10913 Unspecified pre-existing hypertension complicating pregnancy, third trimester: Secondary | ICD-10-CM | POA: Insufficient documentation

## 2021-05-04 DIAGNOSIS — O09293 Supervision of pregnancy with other poor reproductive or obstetric history, third trimester: Secondary | ICD-10-CM

## 2021-05-04 DIAGNOSIS — O365990 Maternal care for other known or suspected poor fetal growth, unspecified trimester, not applicable or unspecified: Secondary | ICD-10-CM

## 2021-05-04 DIAGNOSIS — O99013 Anemia complicating pregnancy, third trimester: Secondary | ICD-10-CM

## 2021-05-04 DIAGNOSIS — D649 Anemia, unspecified: Secondary | ICD-10-CM | POA: Diagnosis not present

## 2021-05-04 DIAGNOSIS — O99213 Obesity complicating pregnancy, third trimester: Secondary | ICD-10-CM

## 2021-05-04 DIAGNOSIS — O30043 Twin pregnancy, dichorionic/diamniotic, third trimester: Secondary | ICD-10-CM

## 2021-05-04 DIAGNOSIS — O365932 Maternal care for other known or suspected poor fetal growth, third trimester, fetus 2: Secondary | ICD-10-CM | POA: Insufficient documentation

## 2021-05-04 NOTE — Procedures (Signed)
Samantha Alexander ?05-Dec-1990 ?[redacted]w[redacted]d ? ?Fetus A Non-Stress Test Interpretation for 05/04/21 ? ?Indication: Unsatisfactory BPPPequita Alexander ?18-Jun-1990 ?[redacted]w[redacted]d ? ? ?Fetus B Non-Stress Test Interpretation for 05/04/21 ? ?Indication: Unsatisfactory BPP ? ?Fetal Heart Rate Fetus B ?Mode: External ?Baseline Rate (B): 130 BPM ?Variability: Moderate ?Accelerations: 15 x 15 ?Decelerations: None ? ?Uterine Activity ?Mode: Toco ?Contraction Frequency (min): 14 ?Contraction Duration (sec): 60-70 ?Contraction Quality: Mild ?Resting Tone Palpated: Relaxed ? ?  ? ? ? ?Fetal Heart Rate A ?Mode: External ?Baseline Rate (A): 140 bpm ?Variability: Moderate ?Accelerations: 15 x 15 ?Decelerations: None ?Multiple birth?: Yes ? ?Uterine Activity ?Mode: Toco ?Contraction Frequency (min): 14 ?Contraction Duration (sec): 60-70 ?Contraction Quality: Mild ?Resting Tone Palpated: Relaxed ? ?  ? ? ?

## 2021-05-04 NOTE — Telephone Encounter (Signed)
Preadmission screen  

## 2021-05-08 ENCOUNTER — Telehealth (HOSPITAL_COMMUNITY): Payer: Self-pay | Admitting: *Deleted

## 2021-05-08 NOTE — Telephone Encounter (Signed)
Preadmission screen  

## 2021-05-09 ENCOUNTER — Encounter (HOSPITAL_COMMUNITY): Payer: Self-pay

## 2021-05-09 DIAGNOSIS — O365999 Maternal care for other known or suspected poor fetal growth, unspecified trimester, other fetus: Secondary | ICD-10-CM | POA: Diagnosis not present

## 2021-05-10 ENCOUNTER — Other Ambulatory Visit: Payer: Self-pay | Admitting: Obstetrics and Gynecology

## 2021-05-10 ENCOUNTER — Inpatient Hospital Stay (HOSPITAL_COMMUNITY): Payer: Medicaid Other | Admitting: Anesthesiology

## 2021-05-10 ENCOUNTER — Ambulatory Visit: Payer: Medicaid Other

## 2021-05-10 ENCOUNTER — Encounter (HOSPITAL_COMMUNITY)
Admission: RE | Disposition: A | Discharge status: Home / Self Care | Payer: Self-pay | Source: Home / Self Care | Attending: Obstetrics & Gynecology

## 2021-05-10 ENCOUNTER — Encounter (HOSPITAL_COMMUNITY): Payer: Self-pay | Admitting: Obstetrics and Gynecology

## 2021-05-10 ENCOUNTER — Inpatient Hospital Stay (HOSPITAL_COMMUNITY)
Admission: RE | Admit: 2021-05-10 | Discharge: 2021-05-13 | DRG: 787 | Disposition: A | Discharge status: Home / Self Care | Payer: Medicaid Other | Attending: Obstetrics & Gynecology | Admitting: Obstetrics & Gynecology

## 2021-05-10 ENCOUNTER — Other Ambulatory Visit: Payer: Self-pay

## 2021-05-10 DIAGNOSIS — Z3A36 36 weeks gestation of pregnancy: Secondary | ICD-10-CM

## 2021-05-10 DIAGNOSIS — O99214 Obesity complicating childbirth: Secondary | ICD-10-CM | POA: Diagnosis present

## 2021-05-10 DIAGNOSIS — O365930 Maternal care for other known or suspected poor fetal growth, third trimester, not applicable or unspecified: Secondary | ICD-10-CM

## 2021-05-10 DIAGNOSIS — O30049 Twin pregnancy, dichorionic/diamniotic, unspecified trimester: Secondary | ICD-10-CM | POA: Diagnosis present

## 2021-05-10 DIAGNOSIS — O1002 Pre-existing essential hypertension complicating childbirth: Secondary | ICD-10-CM | POA: Diagnosis not present

## 2021-05-10 DIAGNOSIS — O321XX2 Maternal care for breech presentation, fetus 2: Secondary | ICD-10-CM | POA: Diagnosis not present

## 2021-05-10 DIAGNOSIS — D62 Acute posthemorrhagic anemia: Secondary | ICD-10-CM | POA: Diagnosis present

## 2021-05-10 DIAGNOSIS — O365932 Maternal care for other known or suspected poor fetal growth, third trimester, fetus 2: Secondary | ICD-10-CM | POA: Diagnosis present

## 2021-05-10 DIAGNOSIS — Z20822 Contact with and (suspected) exposure to covid-19: Secondary | ICD-10-CM | POA: Diagnosis present

## 2021-05-10 DIAGNOSIS — I87309 Chronic venous hypertension (idiopathic) without complications of unspecified lower extremity: Secondary | ICD-10-CM | POA: Diagnosis not present

## 2021-05-10 DIAGNOSIS — O99824 Streptococcus B carrier state complicating childbirth: Secondary | ICD-10-CM | POA: Diagnosis present

## 2021-05-10 DIAGNOSIS — O329XX0 Maternal care for malpresentation of fetus, unspecified, not applicable or unspecified: Secondary | ICD-10-CM | POA: Diagnosis not present

## 2021-05-10 DIAGNOSIS — O30009 Twin pregnancy, unspecified number of placenta and unspecified number of amniotic sacs, unspecified trimester: Secondary | ICD-10-CM | POA: Diagnosis present

## 2021-05-10 DIAGNOSIS — O321XX1 Maternal care for breech presentation, fetus 1: Secondary | ICD-10-CM | POA: Diagnosis not present

## 2021-05-10 DIAGNOSIS — O329XX1 Maternal care for malpresentation of fetus, unspecified, fetus 1: Secondary | ICD-10-CM | POA: Diagnosis present

## 2021-05-10 DIAGNOSIS — O9081 Anemia of the puerperium: Secondary | ICD-10-CM | POA: Diagnosis present

## 2021-05-10 DIAGNOSIS — O30043 Twin pregnancy, dichorionic/diamniotic, third trimester: Secondary | ICD-10-CM | POA: Diagnosis not present

## 2021-05-10 DIAGNOSIS — R748 Abnormal levels of other serum enzymes: Secondary | ICD-10-CM | POA: Diagnosis not present

## 2021-05-10 LAB — COMPREHENSIVE METABOLIC PANEL
ALT: 24 U/L (ref 0–44)
AST: 29 U/L (ref 15–41)
Albumin: 2.6 g/dL — ABNORMAL LOW (ref 3.5–5.0)
Alkaline Phosphatase: 183 U/L — ABNORMAL HIGH (ref 38–126)
Anion gap: 9 (ref 5–15)
BUN: 8 mg/dL (ref 6–20)
CO2: 23 mmol/L (ref 22–32)
Calcium: 8.9 mg/dL (ref 8.9–10.3)
Chloride: 103 mmol/L (ref 98–111)
Creatinine, Ser: 0.79 mg/dL (ref 0.44–1.00)
GFR, Estimated: >60 mL/min (ref >60)
Glucose, Bld: 77 mg/dL (ref 70–99)
Potassium: 4.1 mmol/L (ref 3.5–5.1)
Sodium: 135 mmol/L (ref 135–145)
Total Bilirubin: 0.4 mg/dL (ref 0.3–1.2)
Total Protein: 5.9 g/dL — ABNORMAL LOW (ref 6.5–8.1)

## 2021-05-10 LAB — CBC
HCT: 31.2 % — ABNORMAL LOW (ref 36.0–46.0)
Hemoglobin: 9.6 g/dL — ABNORMAL LOW (ref 12.0–15.0)
MCH: 22.8 pg — ABNORMAL LOW (ref 26.0–34.0)
MCHC: 30.8 g/dL (ref 30.0–36.0)
MCV: 74.1 fL — ABNORMAL LOW (ref 80.0–100.0)
Platelets: 246 10*3/uL (ref 150–400)
RBC: 4.21 MIL/uL (ref 3.87–5.11)
RDW: 22.2 % — ABNORMAL HIGH (ref 11.5–15.5)
WBC: 8.7 10*3/uL (ref 4.0–10.5)
nRBC: 0 % (ref 0.0–0.2)

## 2021-05-10 LAB — AMYLASE: Amylase: 83 U/L (ref 28–100)

## 2021-05-10 LAB — LIPASE, BLOOD: Lipase: 29 U/L (ref 11–51)

## 2021-05-10 LAB — TYPE AND SCREEN
ABO/RH(D): O POS
Antibody Screen: NEGATIVE

## 2021-05-10 LAB — RESP PANEL BY RT-PCR (FLU A&B, COVID) ARPGX2
Influenza A by PCR: NEGATIVE
Influenza B by PCR: NEGATIVE
SARS Coronavirus 2 by RT PCR: NEGATIVE

## 2021-05-10 SURGERY — Surgical Case
Anesthesia: Spinal

## 2021-05-10 MED ORDER — PHENYLEPHRINE HCL-NACL 20-0.9 MG/250ML-% IV SOLN
INTRAVENOUS | Status: DC | PRN
  Administered 2021-05-10: 60 ug/min via INTRAVENOUS

## 2021-05-10 MED ORDER — SIMETHICONE 80 MG PO CHEW: 80.0000 mg | CHEWABLE_TABLET | ORAL | Status: DC | PRN

## 2021-05-10 MED ORDER — SODIUM CHLORIDE 0.9% FLUSH: 3.0000 mL | INTRAVENOUS | Status: DC | PRN

## 2021-05-10 MED ORDER — STERILE WATER FOR IRRIGATION IR SOLN
Status: DC | PRN
  Administered 2021-05-10: 1000 mL

## 2021-05-10 MED ORDER — WITCH HAZEL-GLYCERIN EX PADS: 1.0000 "application " | MEDICATED_PAD | CUTANEOUS | Status: DC | PRN

## 2021-05-10 MED ORDER — SOD CITRATE-CITRIC ACID 500-334 MG/5ML PO SOLN
ORAL | Status: AC
  Filled 2021-05-10: qty 30

## 2021-05-10 MED ORDER — ACETAMINOPHEN 500 MG PO TABS
1000.0000 mg | ORAL_TABLET | Freq: Four times a day (QID) | ORAL | Status: DC
  Administered 2021-05-11 – 2021-05-13 (×10): 1000 mg via ORAL
  Filled 2021-05-10 (×10): qty 2

## 2021-05-10 MED ORDER — OXYCODONE HCL 5 MG PO TABS
5.0000 mg | ORAL_TABLET | ORAL | Status: DC | PRN
  Administered 2021-05-12: 10 mg via ORAL
  Administered 2021-05-12: 5 mg via ORAL
  Administered 2021-05-13: 10 mg via ORAL
  Filled 2021-05-10 (×2): qty 2
  Filled 2021-05-10: qty 1

## 2021-05-10 MED ORDER — LABETALOL HCL 100 MG PO TABS: 100.0000 mg | ORAL_TABLET | Freq: Two times a day (BID) | ORAL | Status: DC

## 2021-05-10 MED ORDER — LIDOCAINE HCL (PF) 1 % IJ SOLN
INTRAMUSCULAR | Status: AC
  Filled 2021-05-10: qty 5

## 2021-05-10 MED ORDER — KETOROLAC TROMETHAMINE 30 MG/ML IJ SOLN
30.0000 mg | Freq: Four times a day (QID) | INTRAMUSCULAR | Status: AC
  Administered 2021-05-11 (×2): 30 mg via INTRAVENOUS
  Filled 2021-05-10 (×3): qty 1

## 2021-05-10 MED ORDER — SIMETHICONE 80 MG PO CHEW
80.0000 mg | CHEWABLE_TABLET | Freq: Three times a day (TID) | ORAL | Status: DC
  Administered 2021-05-11 – 2021-05-13 (×8): 80 mg via ORAL
  Filled 2021-05-10 (×8): qty 1

## 2021-05-10 MED ORDER — ACETAMINOPHEN 10 MG/ML IV SOLN
INTRAVENOUS | Status: AC
  Filled 2021-05-10: qty 100

## 2021-05-10 MED ORDER — ONDANSETRON HCL 4 MG/2ML IJ SOLN
INTRAMUSCULAR | Status: DC | PRN
  Administered 2021-05-10: 4 mg via INTRAVENOUS

## 2021-05-10 MED ORDER — OXYCODONE HCL 5 MG PO TABS: 5.0000 mg | ORAL_TABLET | Freq: Once | ORAL | Status: DC | PRN

## 2021-05-10 MED ORDER — ONDANSETRON HCL 4 MG/2ML IJ SOLN
INTRAMUSCULAR | Status: AC
Start: 2021-05-10 — End: ?
  Filled 2021-05-10: qty 2

## 2021-05-10 MED ORDER — OXYTOCIN-SODIUM CHLORIDE 30-0.9 UT/500ML-% IV SOLN
INTRAVENOUS | Status: AC
  Filled 2021-05-10: qty 500

## 2021-05-10 MED ORDER — OXYTOCIN-SODIUM CHLORIDE 30-0.9 UT/500ML-% IV SOLN: 2.5000 [IU]/h | INTRAVENOUS | Status: AC

## 2021-05-10 MED ORDER — NALOXONE HCL 0.4 MG/ML IJ SOLN: 0.4000 mg | INTRAMUSCULAR | Status: DC | PRN

## 2021-05-10 MED ORDER — DIPHENHYDRAMINE HCL 50 MG/ML IJ SOLN: 12.5000 mg | INTRAMUSCULAR | Status: DC | PRN

## 2021-05-10 MED ORDER — LACTATED RINGERS IV SOLN: INTRAVENOUS | Status: DC

## 2021-05-10 MED ORDER — DIPHENHYDRAMINE HCL 25 MG PO CAPS
25.0000 mg | ORAL_CAPSULE | ORAL | Status: DC | PRN
  Filled 2021-05-10 (×2): qty 1

## 2021-05-10 MED ORDER — TETANUS-DIPHTH-ACELL PERTUSSIS 5-2.5-18.5 LF-MCG/0.5 IM SUSY: 0.5000 mL | PREFILLED_SYRINGE | Freq: Once | INTRAMUSCULAR | Status: DC

## 2021-05-10 MED ORDER — SOD CITRATE-CITRIC ACID 500-334 MG/5ML PO SOLN
30.0000 mL | Freq: Once | ORAL | Status: AC
  Administered 2021-05-10: 30 mL via ORAL

## 2021-05-10 MED ORDER — ACETAMINOPHEN 500 MG PO TABS: 1000.0000 mg | ORAL_TABLET | Freq: Four times a day (QID) | ORAL | Status: DC

## 2021-05-10 MED ORDER — KETOROLAC TROMETHAMINE 30 MG/ML IJ SOLN: 30.0000 mg | Freq: Four times a day (QID) | INTRAMUSCULAR | Status: DC | PRN

## 2021-05-10 MED ORDER — MORPHINE SULFATE (PF) 0.5 MG/ML IJ SOLN
INTRAMUSCULAR | Status: DC | PRN
  Administered 2021-05-10: .15 mg via EPIDURAL

## 2021-05-10 MED ORDER — DIBUCAINE (PERIANAL) 1 % EX OINT: 1.0000 "application " | TOPICAL_OINTMENT | CUTANEOUS | Status: DC | PRN

## 2021-05-10 MED ORDER — CEFAZOLIN SODIUM-DEXTROSE 2-4 GM/100ML-% IV SOLN
2.0000 g | INTRAVENOUS | Status: AC
  Administered 2021-05-10: 2 g via INTRAVENOUS

## 2021-05-10 MED ORDER — ONDANSETRON HCL 4 MG/2ML IJ SOLN: 4.0000 mg | Freq: Three times a day (TID) | INTRAMUSCULAR | Status: DC | PRN

## 2021-05-10 MED ORDER — DEXAMETHASONE SODIUM PHOSPHATE 4 MG/ML IJ SOLN
INTRAMUSCULAR | Status: AC
  Filled 2021-05-10: qty 1

## 2021-05-10 MED ORDER — METOCLOPRAMIDE HCL 5 MG/ML IJ SOLN
INTRAMUSCULAR | Status: AC
  Filled 2021-05-10: qty 2

## 2021-05-10 MED ORDER — MORPHINE SULFATE (PF) 0.5 MG/ML IJ SOLN
INTRAMUSCULAR | Status: AC
  Filled 2021-05-10: qty 10

## 2021-05-10 MED ORDER — SODIUM CHLORIDE 0.9 % IR SOLN
Status: DC | PRN
  Administered 2021-05-10: 1

## 2021-05-10 MED ORDER — SCOPOLAMINE 1 MG/3DAYS TD PT72
MEDICATED_PATCH | TRANSDERMAL | Status: AC
  Filled 2021-05-10: qty 1

## 2021-05-10 MED ORDER — COCONUT OIL OIL: 1.0000 "application " | TOPICAL_OIL | Status: DC | PRN

## 2021-05-10 MED ORDER — NALOXONE HCL 4 MG/10ML IJ SOLN
1.0000 ug/kg/h | INTRAVENOUS | Status: DC | PRN
  Filled 2021-05-10: qty 5

## 2021-05-10 MED ORDER — DIPHENHYDRAMINE HCL 25 MG PO CAPS
25.0000 mg | ORAL_CAPSULE | Freq: Four times a day (QID) | ORAL | Status: DC | PRN
  Administered 2021-05-10 – 2021-05-11 (×2): 25 mg via ORAL

## 2021-05-10 MED ORDER — SODIUM CHLORIDE 0.9 % IV SOLN: INTRAVENOUS | Status: DC | PRN

## 2021-05-10 MED ORDER — OXYTOCIN-SODIUM CHLORIDE 30-0.9 UT/500ML-% IV SOLN
INTRAVENOUS | Status: DC | PRN
  Administered 2021-05-10: 30 [IU] via INTRAVENOUS

## 2021-05-10 MED ORDER — FENTANYL CITRATE (PF) 100 MCG/2ML IJ SOLN
INTRAMUSCULAR | Status: DC | PRN
  Administered 2021-05-10: 15 ug via INTRATHECAL

## 2021-05-10 MED ORDER — CEFAZOLIN SODIUM-DEXTROSE 2-4 GM/100ML-% IV SOLN
INTRAVENOUS | Status: AC
  Filled 2021-05-10: qty 100

## 2021-05-10 MED ORDER — ONDANSETRON HCL 4 MG/2ML IJ SOLN: 4.0000 mg | Freq: Once | INTRAMUSCULAR | Status: DC | PRN

## 2021-05-10 MED ORDER — POLYSACCHARIDE IRON COMPLEX 150 MG PO CAPS
150.0000 mg | ORAL_CAPSULE | Freq: Every day | ORAL | Status: DC
  Administered 2021-05-11 – 2021-05-13 (×4): 150 mg via ORAL
  Filled 2021-05-10 (×4): qty 1

## 2021-05-10 MED ORDER — PRENATAL MULTIVITAMIN CH
1.0000 | ORAL_TABLET | Freq: Every day | ORAL | Status: DC
  Administered 2021-05-11 – 2021-05-13 (×3): 1 via ORAL
  Filled 2021-05-10 (×3): qty 1

## 2021-05-10 MED ORDER — BUPIVACAINE IN DEXTROSE 0.75-8.25 % IT SOLN
INTRATHECAL | Status: DC | PRN
  Administered 2021-05-10: 1.6 mL via INTRATHECAL

## 2021-05-10 MED ORDER — SENNOSIDES-DOCUSATE SODIUM 8.6-50 MG PO TABS
2.0000 | ORAL_TABLET | Freq: Every day | ORAL | Status: DC
  Administered 2021-05-11 – 2021-05-13 (×3): 2 via ORAL
  Filled 2021-05-10 (×3): qty 2

## 2021-05-10 MED ORDER — AMISULPRIDE (ANTIEMETIC) 5 MG/2ML IV SOLN: 10.0000 mg | Freq: Once | INTRAVENOUS | Status: DC | PRN

## 2021-05-10 MED ORDER — OXYCODONE HCL 5 MG/5ML PO SOLN: 5.0000 mg | Freq: Once | ORAL | Status: DC | PRN

## 2021-05-10 MED ORDER — LABETALOL HCL 100 MG PO TABS
100.0000 mg | ORAL_TABLET | Freq: Two times a day (BID) | ORAL | Status: DC
  Administered 2021-05-11 – 2021-05-13 (×6): 100 mg via ORAL
  Filled 2021-05-10 (×6): qty 1

## 2021-05-10 MED ORDER — METOCLOPRAMIDE HCL 5 MG/ML IJ SOLN
10.0000 mg | Freq: Once | INTRAMUSCULAR | Status: AC
  Administered 2021-05-10: 10 mg via INTRAVENOUS

## 2021-05-10 MED ORDER — MENTHOL 3 MG MT LOZG: 1.0000 | LOZENGE | OROMUCOSAL | Status: DC | PRN

## 2021-05-10 MED ORDER — ACETAMINOPHEN 10 MG/ML IV SOLN
INTRAVENOUS | Status: DC | PRN
  Administered 2021-05-10: 1000 mg via INTRAVENOUS

## 2021-05-10 MED ORDER — FENTANYL CITRATE (PF) 100 MCG/2ML IJ SOLN
INTRAMUSCULAR | Status: AC
  Filled 2021-05-10: qty 2

## 2021-05-10 MED ORDER — IBUPROFEN 600 MG PO TABS
600.0000 mg | ORAL_TABLET | Freq: Four times a day (QID) | ORAL | Status: DC
  Administered 2021-05-11 – 2021-05-13 (×9): 600 mg via ORAL
  Filled 2021-05-10 (×9): qty 1

## 2021-05-10 MED ORDER — DEXAMETHASONE SODIUM PHOSPHATE 4 MG/ML IJ SOLN
INTRAMUSCULAR | Status: DC | PRN
  Administered 2021-05-10: 4 mg via INTRAVENOUS

## 2021-05-10 MED ORDER — SCOPOLAMINE 1 MG/3DAYS TD PT72
1.0000 | MEDICATED_PATCH | Freq: Once | TRANSDERMAL | Status: DC
  Administered 2021-05-10: 1.5 mg via TRANSDERMAL

## 2021-05-10 MED ORDER — HYDROMORPHONE HCL 1 MG/ML IJ SOLN: 0.2500 mg | INTRAMUSCULAR | Status: DC | PRN

## 2021-05-10 MED ORDER — POVIDONE-IODINE 10 % EX SWAB
2.0000 "application " | Freq: Once | CUTANEOUS | Status: AC
  Administered 2021-05-10: 2 via TOPICAL

## 2021-05-10 SURGICAL SUPPLY — 39 items
BENZOIN TINCTURE PRP APPL 2/3 (GAUZE/BANDAGES/DRESSINGS) ×2 IMPLANT
CHLORAPREP W/TINT 26ML (MISCELLANEOUS) ×2 IMPLANT
CLAMP CORD UMBIL (MISCELLANEOUS) IMPLANT
CLOSURE STERI STRIP 1/2 X4 (GAUZE/BANDAGES/DRESSINGS) ×1 IMPLANT
CLOTH BEACON ORANGE TIMEOUT ST (SAFETY) ×2 IMPLANT
DRSG OPSITE POSTOP 4X10 (GAUZE/BANDAGES/DRESSINGS) ×2 IMPLANT
ELECT REM PT RETURN 9FT ADLT (ELECTROSURGICAL) ×2
ELECTRODE REM PT RTRN 9FT ADLT (ELECTROSURGICAL) ×1 IMPLANT
EXTRACTOR VACUUM M CUP 4 TUBE (SUCTIONS) IMPLANT
GLOVE BIO SURGEON STRL SZ7.5 (GLOVE) ×2 IMPLANT
GLOVE BIOGEL PI IND STRL 7.0 (GLOVE) ×1 IMPLANT
GLOVE BIOGEL PI IND STRL 7.5 (GLOVE) ×1 IMPLANT
GLOVE BIOGEL PI INDICATOR 7.0 (GLOVE) ×1
GLOVE BIOGEL PI INDICATOR 7.5 (GLOVE) ×1
GOWN STRL REUS W/TWL LRG LVL3 (GOWN DISPOSABLE) ×4 IMPLANT
KIT ABG SYR 3ML LUER SLIP (SYRINGE) IMPLANT
NDL HYPO 25X5/8 SAFETYGLIDE (NEEDLE) IMPLANT
NDL KEITH (NEEDLE) IMPLANT
NEEDLE HYPO 25X5/8 SAFETYGLIDE (NEEDLE) IMPLANT
NEEDLE KEITH (NEEDLE) ×2 IMPLANT
NS IRRIG 1000ML POUR BTL (IV SOLUTION) ×2 IMPLANT
PACK C SECTION WH (CUSTOM PROCEDURE TRAY) ×2 IMPLANT
PAD ABD 8X7 1/2 STERILE (GAUZE/BANDAGES/DRESSINGS) ×1 IMPLANT
PAD OB MATERNITY 4.3X12.25 (PERSONAL CARE ITEMS) ×2 IMPLANT
PENCIL SMOKE EVAC W/HOLSTER (ELECTROSURGICAL) ×2 IMPLANT
RTRCTR C-SECT PINK 25CM LRG (MISCELLANEOUS) ×2 IMPLANT
SPONGE GAUZE 4X4 12PLY STER LF (GAUZE/BANDAGES/DRESSINGS) ×2 IMPLANT
STRIP CLOSURE SKIN 1/2X4 (GAUZE/BANDAGES/DRESSINGS) ×2 IMPLANT
SUT CHROMIC 2 0 CT 1 (SUTURE) ×2 IMPLANT
SUT MNCRL AB 3-0 PS2 27 (SUTURE) ×2 IMPLANT
SUT PLAIN 2 0 XLH (SUTURE) ×2 IMPLANT
SUT VIC AB 0 CT1 36 (SUTURE) ×2 IMPLANT
SUT VIC AB 0 CTX 36 (SUTURE) ×3
SUT VIC AB 0 CTX36XBRD ANBCTRL (SUTURE) ×3 IMPLANT
SUT VIC AB 2-0 SH 27 (SUTURE)
SUT VIC AB 2-0 SH 27XBRD (SUTURE) IMPLANT
TOWEL OR 17X24 6PK STRL BLUE (TOWEL DISPOSABLE) ×2 IMPLANT
TRAY FOLEY W/BAG SLVR 14FR LF (SET/KITS/TRAYS/PACK) ×2 IMPLANT
WATER STERILE IRR 1000ML POUR (IV SOLUTION) ×2 IMPLANT

## 2021-05-10 NOTE — Anesthesia Procedure Notes (Signed)
Spinal ? ?Patient location during procedure: OR ?Start time: 05/10/2021 4:10 PM ?End time: 05/10/2021 4:24 PM ?Reason for block: surgical anesthesia ?Staffing ?Performed: anesthesiologist  ?Anesthesiologist: Merlinda Frederick, MD ?Preanesthetic Checklist ?Completed: patient identified, IV checked, risks and benefits discussed, surgical consent, monitors and equipment checked, pre-op evaluation and timeout performed ?Spinal Block ?Patient position: sitting ?Prep: DuraPrep ?Patient monitoring: cardiac monitor, continuous pulse ox and blood pressure ?Approach: midline ?Injection technique: single-shot ?Needle ?Needle type: Pencan  ?Needle gauge: 24 G ?Needle length: 9 cm ?Assessment ?Sensory level: T4 ?Events: CSF return ?Additional Notes ?Functioning IV was confirmed and monitors were applied. Sterile prep and drape, including hand hygiene and sterile gloves were used. The patient was positioned and the spine was prepped. The skin was anesthetized with lidocaine.  Free flow of clear CSF was obtained prior to injecting local anesthetic into the CSF.  The spinal needle aspirated freely following injection.  The needle was carefully withdrawn.  The patient tolerated the procedure well.  ? ?Three attempts given patients history of significant scoliosis and lordosis. Patient tolerated well. Norton Blizzard, MD  ? ? ? ? ?

## 2021-05-10 NOTE — H&P (Addendum)
OB ADMISSION/ HISTORY & PHYSICAL: ? ?Admission Date: 05/10/2021  1:03 PM  ?Admit Diagnosis: Primary Cesarean for Di/Di twins with breech presentation ? ?Samantha Alexander is a 31 y.o. female G2P1001 [redacted]w[redacted]d presenting for primary C/S for di/di twins with breech presentation. Dis-concordant growth, Twin B, noted at 28 weeks, 9%ile at 30 weeks, 1.2%ile at 33 weeks. Currently, endorses active FM, denies LOF and vaginal bleeding. Feels ctx every 3-5 min, similar to contractions felt prior to admission. Pregnancy complicated by IUGR of twin B <3%ile, breech presentation of Twin A and B, hypertension managed on Losartan before pregnancy and Labetalol 100 mg PO BID during pregnancy,elevated liver enzymes, proteinuria, and hx of severe pre-eclampsia in prev pregnancy.  ? ?History of current pregnancy: ?G2P1001   ?Patient entered care with [facility] at 9+4 wks.   ?EDC 06/07/21 by [name] @ 9+1 wk ?Anatomy scan:  20+1 wks was limited, anatomy complete @ 24+2 wks w/ fundal placenta for Twin A and posterior placenta for Twin B placenta.   ?Antenatal testing: for twin gest started at 28 weeks ? ?Last evaluation: 35+1  wks win A breech, anterior placenta, AFI 4 cm, < 3%ile, BPP 6/8 (no FBM), NST reactive, UA doppler WNL Twin B vtx, left lateral placenta, AFI WNL, BPP 6/8 (no FBM), UA dopplers WNL ? ?Significant prenatal events:  ?Patient Active Problem List  ? Diagnosis Date Noted  ? Chronic peripheral venous hypertension 05/10/2021  ? Twin gestation, dichorionic diamniotic 05/10/2021  ? Discordant fetal growth in twin gestation 05/10/2021  ? Breech presentation 05/10/2021  ? Obesity in pregnancy 04/27/2021  ? Chronic hypertension in pregnancy 02/15/2021  ? History of pre-eclampsia 02/15/2021  ? ? ?Prenatal Labs: ?ABO, Rh: O/Positive/-- (09/01 0000) ?Antibody: Negative (09/01 0000) ?Rubella: Immune (09/01 0000)  ?RPR: Nonreactive (09/01 0000)  ?HBsAg: Negative (09/01 0000)  ?HIV: Non-reactive (09/01 0000)  ?GTT: normal 1 hr screen ?GBS:    positive 04/12/21 ?GC/CHL: neg/neg ?Genetics: declined ?Tdap/influenza vaccines: declined tdap, received flu vax ? ? ?OB History  ?Gravida Para Term Preterm AB Living  ?2 1 1     1   ?SAB IAB Ectopic Multiple Live Births  ?      0 1  ?  ?# Outcome Date GA Lbr Len/2nd Weight Sex Delivery Anes PTL Lv  ?2 Current           ?1 Term 03/08/18 [redacted]w[redacted]d 02:35 / 00:30 3056 g F Vag-Spont None  LIV  ? ? ?Medical / Surgical History: ?Past medical history:  ?Past Medical History:  ?Diagnosis Date  ? Abscess of vulva   ? Anemia   ? Headache   ? HPV (human papilloma virus) anogenital infection   ? Pregnancy induced hypertension   ?  ?Past surgical history:  ?Past Surgical History:  ?Procedure Laterality Date  ? hemorrhoid ligation    ? WISDOM TOOTH EXTRACTION    ? ?Family History:  ?Family History  ?Problem Relation Age of Onset  ? Hypertension Mother   ? Diabetes Mother   ? Hypertension Father   ? Diabetes Father   ? Diabetes Sister   ? Lupus Sister   ?  ?Social History:  reports that she has never smoked. She has never used smokeless tobacco. She reports that she does not drink alcohol and does not use drugs. ? ?Allergies: ?Fentanyl ?  ?Current Medications at time of admission:  ?Prior to Admission medications   ?Medication Sig Start Date End Date Taking? Authorizing Provider  ?BABY ASPIRIN PO Take 81 mg by mouth  daily.    [provider]  ?cholecalciferol (VITAMIN D3) 25 MCG (1000 UNIT) tablet Take 1,000 Units by mouth daily.    [provider]  ?Cholecalciferol (VITAMIN D3) 50 MCG (2000 UT) capsule TAKE 2 CAPSULES BY MOUTH ONCE DAILY 04/18/21   [provider]  ?Doxylamine-Pyridoxine 10-10 MG TBEC Diclegis 10 mg-10 mg tablet,delayed release ? Take 1 tablet twice a day by oral route.    [provider]  ?iron polysaccharides (NIFEREX) 150 MG capsule Take 1 capsule (150 mg total) by mouth daily. 02/02/21   Johney Maine, MD  ?labetalol (NORMODYNE) 100 MG tablet Take 100 mg by mouth 2 (two)  times daily.    [provider]  ?omeprazole (PRILOSEC) 10 MG capsule Take 10 mg by mouth daily.    [provider]  ?ondansetron (ZOFRAN) 4 MG tablet Take 4 mg by mouth 2 (two) times daily.    [provider]  ?Prenatal Vit-Fe Fumarate-FA (PREPLUS) 27-1 MG TABS Take by mouth.    [provider]  ?prochlorperazine (COMPAZINE) 10 MG tablet Take 1 tablet (10 mg total) by mouth every 6 (six) hours as needed for nausea or vomiting. 01/03/21   Rolm Bookbinder, CNM  ?promethazine (PHENERGAN) 25 MG tablet Take 25 mg by mouth every 6 (six) hours as needed for nausea or vomiting.    [provider]  ?scopolamine (TRANSDERM-SCOP) 1 MG/3DAYS Place 1 patch (1.5 mg total) onto the skin every 3 (three) days. 01/06/21   Rolm Bookbinder, CNM  ?terconazole (TERAZOL 7) 0.4 % vaginal cream INSERT 1 APPLICATORFUL VAGINALLY  AT BEDTIME FOR 7 DAYS 01/16/21   Judeth Horn, NP  ? ? ?Review of Systems: ?Constitutional: Negative   ?HENT: Negative   ?Eyes: Negative   ?Respiratory: Negative   ?Cardiovascular: Negative   ?Gastrointestinal: Negative  ?Genitourinary: neg for bloody show, neg for LOF   ?Musculoskeletal: Negative   ?Skin: Negative   ?Neurological: Negative   ?Endo/Heme/Allergies: Negative   ?Psychiatric/Behavioral: Negative  ? ? ?Physical Exam: ?VS: Blood pressure 138/79, pulse 79, temperature 98 ?F (36.7 ?C), temperature source Oral, resp. rate 18, height 5\' 8"  (1.727 m), weight 111.9 kg, last menstrual period 08/28/2020, unknown if currently breastfeeding. ?AAO x3, no signs of distress ?Cardiovascular: RRR ?Respiratory: Lung fields clear to ausculation ?GU/GI: Abdomen gravid, non-tender, non-distended, active FM, vertex ?Extremities: no edema, negative for pain, tenderness, and cords ? ?Cervical exam: deferred ?FHR A : baseline rate 135 / variability moderate / accelerations present / absent decelerations ?FHR : baseline rate 140 / variability moderate / accelerations present /  absent decelerations ?TOCO: 3-5 ? ? ?Prenatal Transfer Tool  ?Maternal Diabetes: No ?Genetic Screening: Declined ?Maternal Ultrasounds/Referrals: IUGR of Twin B <3%ile ?Fetal Ultrasounds or other Referrals:  Referred to Materal Fetal Medicine  ?Maternal Substance Abuse:  No ?Significant Maternal Medications:  Meds include: Other: Labetalol 100 mg PO BID ?Significant Maternal Lab Results: Group B Strep positive ? ? ? ?Assessment: ?30 y.o. G2P1001 [redacted]w[redacted]d ?Primary Cesarean Section ?Di/Di twins ?Breech presentation x2 ?Hypertension ?Hx of elevated liver enzymes ? ?FHR category 1 x2 ?GBS positive, treat on-call to OR ?Pain management plan: per anesthesia ? ? ?Plan:  ?Admit to L&D OR ?Routine pre-op admission orders ?CMP ?Amylase and Lipase ?Continue Labetalol 100 mg PO BID ? ?Dr [redacted]w[redacted]d notified of admission and plan of care ? ?Su Hilt MSN, CNM ?05/10/2021 1:33 PM ? ?Risks benefits alternatives reviewed with the patient including but not limited to bleeding infection and injury, questions  answered and consent signed and witnessed.  Pt and partner did not have any questions. ?

## 2021-05-10 NOTE — Anesthesia Preprocedure Evaluation (Addendum)
Anesthesia Evaluation  ?Patient identified by MRN, date of birth, ID band ?Patient awake ? ? ? ?Reviewed: ?Allergy & Precautions, NPO status , Patient's Chart, lab work & pertinent test results ? ?Airway ?Mallampati: III ? ?TM Distance: >3 FB ?Neck ROM: Full ? ? ? Dental ?no notable dental hx. ? ?  ?Pulmonary ?neg pulmonary ROS,  ?  ?Pulmonary exam normal ?breath sounds clear to auscultation ? ? ? ? ? ? Cardiovascular ?hypertension, negative cardio ROS ?Normal cardiovascular exam ?Rhythm:Regular Rate:Normal ? ? ?  ?Neuro/Psych ? Headaches, negative psych ROS  ? GI/Hepatic ?negative GI ROS, Neg liver ROS,   ?Endo/Other  ?obesity ? Renal/GU ?negative Renal ROS  ?negative genitourinary ?  ?Musculoskeletal ?Scoliois and lordosis.   ? Abdominal ?  ?Peds ?negative pediatric ROS ?(+)  Hematology ? ?(+) Blood dyscrasia, anemia ,   ?Anesthesia Other Findings ? ? Reproductive/Obstetrics ?(+) Pregnancy ? ?  ? ? ? ? ? ? ? ? ? ? ? ? ? ?  ?  ? ? ? ? ? ? ? ?Anesthesia Physical ?Anesthesia Plan ? ?ASA: 3 ? ?Anesthesia Plan: Spinal  ? ?Post-op Pain Management:   ? ?Induction:  ? ?PONV Risk Score and Plan: 2 and Treatment may vary due to age or medical condition ? ?Airway Management Planned: Natural Airway ? ?Additional Equipment: None ? ?Intra-op Plan:  ? ?Post-operative Plan: Extubation in OR ? ?Informed Consent: I have reviewed the patients History and Physical, chart, labs and discussed the procedure including the risks, benefits and alternatives for the proposed anesthesia with the patient or authorized representative who has indicated his/her understanding and acceptance.  ? ? ? ?Dental advisory given ? ?Plan Discussed with: CRNA, Surgeon and Anesthesiologist ? ?Anesthesia Plan Comments: (Had a comprehensive conversation regarding fentanyl. After weighing risks/benefits, patient would like fentanyl to be included in the spinal solution. Will have medication available as needed for itching  post-operatively. Tanna Furry, MD  ?)  ? ? ? ? ? ?Anesthesia Quick Evaluation ? ?

## 2021-05-10 NOTE — Lactation Note (Signed)
This note was copied from a baby's chart. ?Lactation Consultation Note ? ?Patient Name: Samantha Alexander ?Today's Date: 05/10/2021 ?Reason for consult: Initial assessment;Late-preterm 34-36.6wks (Sibling is in NICU ( female).) ?Age:30 hours ?Mom declined donor breast milk , mom current feeding choice is breast and formula feeding infant.  ?Per mom, she attempt to latch infant in recovery and on MBU, infant was not interested in latching to breast at this time. ?LC discussed hand expression with breast module and mom self expressed, few drops of colostrum was finger fed to infant. ?Mom was set up with DEBP to help stimulate and establish milk supply, mom fitted with 24 mm breast flange, mom was pumping when LC was in room. ?Infant having low temps ( hypothermia) dad is doing skin to skin with infant, infant was given 11 mls of Similac Neosure 22 with kcals, infant was pace feed. ?Mom knows breast milk is safe at room temperature for 4 hours whereas formula must be used within 1 hour. ?Mom shown how to use DEBP & how to disassemble, clean, & reassemble parts.  ?Mom understands feeding plan may change: ?Mom will follow [facility] feeding policy ( green sheet given to parents and discussed) ?1- Mom will continue to attempt to latch infant according hunger cues, 8 to 12+ or more times within 24 hours, skin to skin. ?2- Mom will ask RN/LC for further latch assistance if needed. ?3- If infant is not latching after 5 minutes mom will stop to reserve infant's energy and supplement infant with any EBM first and then formula. ?4- Mom will supplement infant with 5-10 mls after latching infant at breast , if infant doesn't latch mom will offer 10 mls per feeding.  ?Maternal Data ?Has patient been taught Hand Expression?: Yes ?Does the patient have breastfeeding experience prior to this delivery?: Yes ?How long did the patient breastfeed?: Per mom, she briefly BF her 1st child for 2 weeks. ? ?Feeding ?Mother's Current Feeding Choice:  Breast Milk and Formula ? ?LATCH Score ?Latch: Too sleepy or reluctant, no latch achieved, no sucking elicited. ? ?Audible Swallowing: None ? ?Type of Nipple: Everted at rest and after stimulation ? ?Comfort (Breast/Nipple): Soft / non-tender ? ?Hold (Positioning): Assistance needed to correctly position infant at breast and maintain latch. ? ?LATCH Score: 5 ? ? ?Lactation Tools Discussed/Used ?Tools: Pump ?Breast pump type: Double-Electric Breast Pump ?Pump Education: Setup, frequency, and cleaning;Milk Storage ?Reason for Pumping: LPTI currently not latching breast and sibling is in NICU. ?Pumping frequency: Mom knows to pump every 3 hours for 15 minutes on inital setting. ? ?Interventions ?Interventions: Breast feeding basics reviewed;Assisted with latch;Skin to skin;Breast massage;Breast compression;Adjust position;Support pillows;Position options;Expressed milk;Hand pump;DEBP;Education;LC Services brochure ? ?Discharge ?  ? ?Consult Status ?  ? ? ? ?Danelle Earthly ?05/10/2021, 9:19 PM ? ? ? ?

## 2021-05-10 NOTE — Op Note (Signed)
Cesarean Section Procedure Note ? ?Indications: P1 at 36wks presenting for recommended c-section by MFM d/t FGR of Twin B.  S/p BMZ around 05/03/21. ? ?Pre-operative Diagnosis: 1.36wks 2.TWINS 3.MALPRESENTATION 4.DISCORDANT GROWTH AND FGR OF TWIN B ?  ?Post-operative Diagnosis: 1.36wks 2.TWINS 3.MALPRESENTATION 4.DISCORDANT GROWTH AND FGR OF TWIN B ? ?Procedure: CESAREAN SECTION ? ?Surgeon: Osborn Coho, MD   ? ?Assistants: Rhea Pink, CNM ? ?Anesthesia: Regional ? ?Anesthesiologist: Mellody Dance, MD  ? ?Procedure Details  ?The patient was taken to the operating room secondary to c-section for twins after the risks, benefits, complications, treatment options, and expected outcomes were discussed with the patient.  The patient concurred with the proposed plan, giving informed consent which was signed and witnessed. The patient was taken to Operating Room A, identified as Dajiah Kooi and the procedure verified as C-Section Delivery. A Time Out was held and the above information confirmed. ? ?After induction of anesthesia by obtaining a spinal, the patient was prepped and draped in the usual sterile manner. A Pfannenstiel skin incision was made and carried down through the subcutaneous tissue to the underlying layer of fascia.  The fascia was incised bilaterally and extended transversely bilaterally with the Mayo scissors. Kocher clamps were placed on the inferior aspect of the fascial incision and the underlying rectus muscle was separated from the fascia. The same was done on the superior aspect of the fascial incision.  The peritoneum was identified, entered bluntly and extended manually.  An Alexis self-retaining retractor was placed.  The utero-vesical peritoneal reflection was incised transversely and the bladder flap was bluntly freed from the lower uterine segment. A low transverse uterine incision was made with the scalpel and extended bilaterally with the bandage scissors.  Twin A was delivered in  breech position without difficulty.  Twin B delivered in breech position without difficulty.  After the umbilical cord was clamped and cut, the infant was handed to the awaiting pediatricians.  Cord blood was obtained for evaluation.  The placenta was removed intact and appeared to be within normal limits. The uterus was cleared of all clots and debris. The uterine incision was closed with running interlocking sutures of 0 Vicryl and a second imbricating layer was performed as well.   Bilateral tubes and ovaries appeared to be within normal limits.  Good hemostasis was noted.  Copious irrigation was performed until clear.  The peritoneum was repaired with 2-0 chromic via a running suture.  The fascia was reapproximated with a running suture of 0 Vicryl. The subcutaneous tissue was reapproximated with 3 interrupted sutures of 2-0 plain.  The skin was reapproximated with a subcuticular suture of 3-0 monocryl.  Steristrips were applied. ? ?Instrument, sponge, and needle counts were correct prior to abdominal closure and at the conclusion of the case.  The patient was awaiting transfer to the recovery room in good condition. ? ?Findings: ?Live female infant with Apgars 8 at one minute and 8 at five minutes.  Live female infant with Apgars 9 at one minute and 9 at five minutes. Normal appearing bilateral ovaries and fallopian tubes were noted. ? ?Estimated Blood Loss:  350 ml ?        ?Drains: foley to gravity with 150 cc clear urine ?        ?Total IV Fluids: 1400 ml ?        ?Specimens to Pathology: Placentas to path ?        ?Complications:  None; patient tolerated the procedure well. ?        ?  Disposition: PACU - hemodynamically stable. ?        ?Condition: stable ? ?Attending Attestation: I performed the procedure. ? ?I was present and scrubbed and the assistant was required due to complexity of anatomy. ?  ?

## 2021-05-10 NOTE — Progress Notes (Signed)
OBOR charge called patient to come in early for lab work but voicemail was full ?

## 2021-05-11 ENCOUNTER — Ambulatory Visit: Payer: Medicaid Other

## 2021-05-11 ENCOUNTER — Encounter (HOSPITAL_COMMUNITY): Payer: Self-pay | Admitting: Obstetrics and Gynecology

## 2021-05-11 LAB — CBC
HCT: 27.4 % — ABNORMAL LOW (ref 36.0–46.0)
Hemoglobin: 8.5 g/dL — ABNORMAL LOW (ref 12.0–15.0)
MCH: 22.7 pg — ABNORMAL LOW (ref 26.0–34.0)
MCHC: 31 g/dL (ref 30.0–36.0)
MCV: 73.1 fL — ABNORMAL LOW (ref 80.0–100.0)
Platelets: 237 10*3/uL (ref 150–400)
RBC: 3.75 MIL/uL — ABNORMAL LOW (ref 3.87–5.11)
RDW: 22 % — ABNORMAL HIGH (ref 11.5–15.5)
WBC: 11 10*3/uL — ABNORMAL HIGH (ref 4.0–10.5)
nRBC: 0 % (ref 0.0–0.2)

## 2021-05-11 LAB — RPR: RPR Ser Ql: NONREACTIVE

## 2021-05-11 NOTE — Progress Notes (Signed)
TDAP VIS form given to patient. ?

## 2021-05-11 NOTE — Transfer of Care (Signed)
Immediate Anesthesia Transfer of Care Note ? ?Patient: Samantha Alexander ? ?Procedure(s) Performed: CESAREAN SECTION ? ?Patient Location: PACU ? ?Anesthesia Type:Spinal ? ?Level of Consciousness: awake and alert  ? ?Airway & Oxygen Therapy: Patient Spontanous Breathing ? ?Post-op Assessment: Report given to RN and Post -op Vital signs reviewed and stable ? ?Post vital signs: Reviewed and stable ? ?Last Vitals:  ?Vitals Value Taken Time  ?BP 113/74 05/11/21 0922  ?Temp 37 ?C 05/11/21 0922  ?Pulse 81 05/11/21 0922  ?Resp 18 05/11/21 0922  ?SpO2 97 % 05/11/21 0922  ? ? ?Last Pain:  ?Vitals:  ? 05/11/21 0922  ?TempSrc: Oral  ?PainSc: 2   ?   ? ?  ? ?Complications: No notable events documented. ?

## 2021-05-11 NOTE — Anesthesia Postprocedure Evaluation (Signed)
Anesthesia Post Note ? ?Patient: Samantha Alexander ? ?Procedure(s) Performed: CESAREAN SECTION ? ?  ? ?Patient location during evaluation: PACU ?Anesthesia Type: Spinal ?Level of consciousness: oriented and awake and alert ?Pain management: pain level controlled ?Vital Signs Assessment: post-procedure vital signs reviewed and stable ?Respiratory status: spontaneous breathing and respiratory function stable ?Cardiovascular status: blood pressure returned to baseline and stable ?Postop Assessment: no headache, no backache, no apparent nausea or vomiting and able to ambulate ?Anesthetic complications: no ? ? ?No notable events documented. ? ?Last Vitals:  ?Vitals:  ? 05/11/21 0055 05/11/21 0922  ?BP: 138/70 113/74  ?Pulse: 66 81  ?Resp: 18 18  ?Temp: 36.9 ?C 37 ?C  ?SpO2:  97%  ?  ?Last Pain:  ?Vitals:  ? 05/11/21 0922  ?TempSrc: Oral  ?PainSc: 2   ? ? ?  ?  ?  ?  ?  ?  ? ?Mellody Dance ? ? ? ? ?

## 2021-05-11 NOTE — Clinical Social Work Maternal (Signed)
?CLINICAL SOCIAL WORK MATERNAL/CHILD NOTE ? ?Patient Details  ?Name: Samantha Alexander ?MRN: [id number] ?Date of Birth: 02/22/1991 ? ?Date:  05/11/2021 ? ?Clinical Social Worker Initiating Note:  Clemie General, LCSW Date/Time: Initiated:  05/11/21/1411    ? ?Child's Name:  (Girl) [name] Burns; (BoyB) [name] Burns  ? ?Biological Parents:  Mother, Father (Father: Brandon Burns)  ? ?Need for Interpreter:  None  ? ?Reason for Referral:  Other (Comment) (Infant's NICU Admission)  ? ?Address:  [address] ?[place] [address]  ?  ?Phone number:  [phone] (home)    ? ?Additional phone number:  ? ?Household Members/Support Persons (HM/SP):   Household Member/Support Person 1, Household Member/Support Person 2 ? ? ?HM/SP Name Relationship DOB or Age  ?HM/SP -1 Morgan Earles daughter 03/08/18  ?HM/SP -2   mother    ?HM/SP -3        ?HM/SP -4        ?HM/SP -5        ?HM/SP -6        ?HM/SP -7        ?HM/SP -8        ? ? ?Natural Supports (not living in the home):  Immediate Family, Spouse/significant other  ? ?Professional Supports: None  ? ?Employment: Full-time  ? ?Type of Work: Team Lead  ? ?Education:  Some College  ? ?Homebound arranged:   ? ?Financial Resources:  Medicaid  ? ?Other Resources:  Food Stamps  , WIC  ? ?Cultural/Religious Considerations Which May Impact Care:   ? ?Strengths:  Ability to meet basic needs  , Pediatrician chosen, Understanding of illness  ? ?Psychotropic Medications:        ? ?Pediatrician:    [place] area ? ?Pediatrician List:  ? ?[facility]  ?[place]    ?[place]    ?[place]    ?[place]    ?[place]    ? ? ?Pediatrician Fax Number:   ? ?Risk Factors/Current Problems:  Mental Health Concerns    ? ?Cognitive State:  Alert  , Able to Concentrate  , Insightful  , Goal Oriented  , Linear Thinking    ? ?Mood/Affect:  Calm  , Happy  , Interested  , Comfortable  , Relaxed    ? ?CSW Assessment: CSW met with MOB at bedside to complete  psychosocial assessment. MOB was engaged in skin to skin with infant (Girl: [name]). CSW introduced self and explained role. MOB was welcoming, open, pleasant, and remained engaged during assessment. MOB reported that she resides with her mother and daughter. MOB reported that she receives both WIC and food stamps. 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 MOB reported that she has some items needed to care for infants including 2 car seats, 2 basinets, and 1 crib. MOB reported that she still has stuff coming in. CSW informed MOB about [employer]'s [employer] if any assistance is needed obtaining items for infant. MOB reported that assistance with preemie clothing would be helpful, CSW agreed to make referral. CSW inquired about MOB's support system, MOB reported that FOB, her mom, and sister are supports.  ? ?CSW inquired about MOB's mental health history. MOB denied any mental health history. MOB endorsed a history of postpartum depression that started shortly after she gave birth to her daughter 3 years ago. MOB attributed her postpartum depression to being in a bad environment. MOB described her postpartum depression as being sad, crying, and   having a loss of interest in doing things. MOB reported that her postpartum depression lasted for more than a year. MOB reported that it got better once she started doing things to protect herself and her daughter (filing a restraining order, limiting contact, and filing for custody). CSW positively affirmed MOB doing things to protect herself and daughter. FOB entered the room, CSW asked to speak with MOB privately. FOB left the room.  ? ?CSW inquired about how MOB was feeling emotionally since giving birth, MOB reported that she was feeling good. MOB presented calm and did not demonstrate any acute mental health signs/symptoms. CSW assessed for safety, MOB denied SI, HI, and domestic violence.  ? ?CSW provided education regarding the baby  blues period vs. perinatal mood disorders, discussed treatment and gave resources for mental health follow up if concerns arise.  CSW recommends self-evaluation during the postpartum time period using the New Mom Checklist from Postpartum Progress and encouraged MOB to contact a medical professional if symptoms are noted at any time.   ? ?CSW provided review of Sudden Infant Death Syndrome (SIDS) precautions.   ? ?CSW and MOB discussed infant's (BoyB: [name]) NICU admission. CSW informed MOB about the NICU, what to expect, and resources/supports available while infant is admitted to the NICU. MOB reported that she doesn't know how the NICU admission is going. CSW asked if MOB was feeling informed about infant's care, MOB reported yes and no. MOB explained that she has been updated but understands that providers cannot give her specific answers on when infant will be ready for discharge. MOB denied any transportation barriers with visiting infant in the NICU. MOB denied any questions/concerns regarding the NICU. CSW informed MOB that infant qualifies to apply for SSI benefits, MOB reported that she may be interested in applying. CSW went over SSI benefits and application process. CSW provided MOB with information on applying for SSI benefits. MOB denied any questions at this time.  ? ?CSW will continue to offer resources/supports while infant is admitted to the NICU.  ? ? ?CSW Plan/Description:  Psychosocial Support and Ongoing Assessment of Needs, Sudden Infant Death Syndrome (SIDS) Education, Perinatal Mood and Anxiety Disorder (PMADs) Education, Other Patient/Family Education, Supplemental Security Income (SSI) Information, Other Information/Referral to Community Resources  ? ? ?Dorin Stooksbury L Abdulmalik Darco, LCSW ?05/11/2021, 2:14 PM ?

## 2021-05-11 NOTE — Progress Notes (Signed)
Subjective: ?Postpartum Day 1: Cesarean Delivery ?Patient reports feeling okay. She notes that her pain is well controlled.  ?She hasn't been out of bed or voided yet.  ?She is breast feeding.  ? ?Objective: ?Vital signs in last 24 hours: ?Temp:  [97.3 ?F (36.3 ?C)-98.6 ?F (37 ?C)] 98.6 ?F (37 ?C) (03/09 0109) ?Pulse Rate:  [61-88] 81 (03/09 0922) ?Resp:  [12-21] 18 (03/09 3235) ?BP: (113-142)/(70-102) 113/74 (03/09 5732) ?SpO2:  [97 %-100 %] 97 % (03/09 0922) ?Weight:  [111.9 kg] 111.9 kg (03/08 1321) ? ?Physical Exam:  ?General: alert, cooperative, and no distress ?Lochia: appropriate ?Uterine Fundus: firm ?Incision: healing well, no significant drainage, no dehiscence, pressure dressing in place ?DVT Evaluation: No evidence of DVT seen on physical exam. ?Negative Homan's sign. ?No cords or calf tenderness. ? ?Recent Labs  ?  05/10/21 ?1337 05/11/21 ?2025  ?HGB 9.6* 8.5*  ?HCT 31.2* 27.4*  ? ? ?Assessment/Plan: ?Status post Cesarean section. ?Will continue routine postoperative care      ?Diet advanced ?Advised warm fluids and ambulation to improve GI motility ?Breastfeeding support ?IV saline locked ? ?Samantha Alexander ?05/11/2021, 10:57 AM ? ? ?

## 2021-05-11 NOTE — Lactation Note (Addendum)
This note was copied from Alexander Samantha's chart. ? ?NICU Lactation Consultation Note ? ?Patient Name: Samantha Alexander ?Today's Date: 05/11/2021 ?Age:30 hours ? ? ?Subjective ?Reason for consult: Initial assessment; Follow-up assessment; NICU Samantha; Late-preterm 34-36.6wks; Multiple gestation ? ?Lactation followed up with [name] on mother Samantha unit. Alexander mother-Samantha IBCLC set up her DEBP yesterday. [name] is rooming in with Samantha Alexander, Samantha Alexander, while Samantha Alexander, Samantha Alexander, is on the NICU. ? ?Ms. [name] endorses breast feeding Samantha Alexander more today, but states that she has not pumped since 0300. She attributes this to lack of time with infants on different floors and numerous visits by providers. ? ?I recommended that she try to prioritize some time to use her DEBP. We discussed Alexander plan to limit breast feeding Samantha Alexander to no longer than 20-30 minutes. Then, if she has Alexander support person who can supplement Samantha Alexander, she can then pump for 15 minutes directly after breastfeeding. Any EBM can go to Samantha Alexander in NICU. ? ?I also recommended that she hand express and do some oral care when visiting with Samantha Alexander in the NICU. ? ?Ms. Shrewsbury is interested in breast feeding Samantha Alexander. I indicated that lactation would try to follow up tomorrow and assist with latching Samantha Alexander (Samantha Alexander) in the NICU.  ? ?We discussed day 2 infant feeding patterns for Samantha Samantha Alexander. Ms. [name] mentioned that she wants to put Samantha Alexander on Samantha Alexander's feeding schedule. I discouraged this practice at this time, and I encouraged her to breast feed Samantha Alexander on demand 8-12 times/day. ? ?Objective ?Infant data: ?Mother's Current Feeding Choice: Breast Milk and Formula ? ?Infant feeding assessment ?Scale for Readiness: 2 ?Scale for Quality: 4 ? ?Maternal data: ?[id number]  ?C-Section, Low Transverse ? ?Current breast feeding challenges:: NICU; multiple gestation; separation of twins ? ?Does the patient have breastfeeding experience prior to this delivery?: Yes ?How long did the patient  breastfeed?: several months (breast/bottle) ? ?Pumping frequency: last pumping session was at 0300. recommended pumping q3 hours ?Pumped volume: 0 mL ? ? ?Pump: Personal (Spectra S1) ? ?Assessment ? ?Maternal: ?Milk volume: Normal ? ? ?Intervention/Plan ?Interventions: Breast feeding basics reviewed; Education ? ?Tools: Pump ?Pump Education: Setup, frequency, and cleaning ? ?Plan: ?Consult Status: Follow-up ? ?NICU Follow-up type: New admission follow up ? ? ? ?Walker Shadow ?05/11/2021, 4:06 PM ?

## 2021-05-12 ENCOUNTER — Encounter (HOSPITAL_COMMUNITY): Payer: Self-pay | Admitting: Obstetrics and Gynecology

## 2021-05-12 ENCOUNTER — Other Ambulatory Visit: Payer: Self-pay

## 2021-05-12 DIAGNOSIS — D62 Acute posthemorrhagic anemia: Secondary | ICD-10-CM | POA: Diagnosis not present

## 2021-05-12 LAB — SURGICAL PATHOLOGY

## 2021-05-12 NOTE — Progress Notes (Signed)
Samantha Alexander [id number] ?Postpartum Postoperative Day # 2  ?[name], [id number], [redacted]w[redacted]d, S/P Primary LT Cesarean Section due to pt was admitted on 3/8 at [redacted] weeks pregnant for breech DIDi twins with dis-cordinent weights, BBB was IUGR. Pt was taken to the OR on 3/8 with Dr Mancel Bale for delivery of PCS, Baby A female in room doing well, baby boy B in NICU, desires in pt circ prior to discharge in NICU currently. EBL was 350-423mls, hgb drop of 9.6-8.5 on PO Iron and asymptomatic. Pt has h/o CHTN controlled on labetalol 100mg  BID PO through out entire pregnancy. Asymptomatic, and currently BP 133/82.  ? ?Patient Active Problem List  ? Diagnosis Date Noted  ? Acute blood loss anemia 05/12/2021  ? Chronic peripheral venous hypertension 05/10/2021  ? Twin gestation, dichorionic diamniotic 05/10/2021  ? Discordant fetal growth in twin gestation 05/10/2021  ? Breech presentation 05/10/2021  ? Postpartum care following cesarean delivery 05/10/2021  ? Obesity in pregnancy 04/27/2021  ? Chronic hypertension in pregnancy 02/15/2021  ? History of pre-eclampsia 02/15/2021  ?  ? ?Active Ambulatory Problems  ?  Diagnosis Date Noted  ? Chronic hypertension in pregnancy 02/15/2021  ? History of pre-eclampsia 02/15/2021  ? Obesity in pregnancy 04/27/2021  ? ?Resolved Ambulatory Problems  ?  Diagnosis Date Noted  ? Candida vaginitis 07/11/2017  ? Subchorionic hematoma in first trimester 07/11/2017  ? Intrauterine pregnancy 07/11/2017  ? Anemia complicating pregnancy [id number]  ? Blood pressure elevated without history of HTN 03/07/2018  ? Vaginal delivery 03/09/2018  ? Gestational hypertension 03/10/2018  ? Normal postpartum course 03/10/2018  ? Postpartum anemia 03/10/2018  ? Hypertension in pregnancy, preeclampsia, severe, delivered/postpartum 03/13/2018  ? Postural dizziness with presyncope 02/15/2021  ? SOB (shortness of breath) 02/15/2021  ? ?Past Medical History:  ?Diagnosis Date  ? Abscess of vulva   ? Anemia   ?  Headache   ? HPV (human papilloma virus) anogenital infection   ? Pregnancy induced hypertension   ?  ? ?Subjective: ?Patient up ad lib, denies syncope or dizziness. Reports consuming regular diet without issues and denies N/V. Patient reports 0 bowel movement + passing flatus.  Denies issues with urination and reports bleeding is "lighter."  Patient is breastfeeding and reports going well.  Desires undecided for postpartum contraception.  Pain is being appropriately managed with use of po meds. Pt denies HA, RUQ pain or vision changes, denies dizziness, heart palpitations or fatigue.  ? ? ?Objective: ?Patient Vitals for the past 24 hrs: ? BP Temp Temp src Pulse Resp SpO2  ?05/12/21 0529 133/82 98.7 ?F (37.1 ?C) Oral 66 18 --  ?05/11/21 2040 126/60 98.1 ?F (36.7 ?C) Oral 74 18 --  ?05/11/21 1352 125/76 98.2 ?F (36.8 ?C) Oral 64 18 99 %  ?  ? ?Physical Exam:  ?General: alert, cooperative, appears stated age, and no distress ?Mood/Affect: happy ?Lungs: clear to auscultation, no wheezes, rales or rhonchi, symmetric air entry.  ?Heart: normal rate, regular rhythm, normal S1, S2, no murmurs, rubs, clicks or gallops. ?Breast: breasts appear normal, no suspicious masses, no skin or nipple changes or axillary nodes. ?Abdomen:  + bowel sounds, soft, non-tender ?Incision: healing well, no significant drainage, no dehiscence, no significant erythema, Honeycomb dressing  ?Uterine Fundus: firm, involution -1 ?Lochia: appropriate ?Skin: Warm, Dry. ?DVT Evaluation: No evidence of DVT seen on physical exam. ?Negative Homan's sign. ?No cords or calf tenderness. ?No significant calf/ankle edema. ? ?Labs: ?Recent Labs  ?  05/10/21 ?1337 05/11/21 ?[id number]  ?HGB  9.6* 8.5*  ?HCT 31.2* 27.4*  ?WBC 8.7 11.0*  ? ? ?CBG (last 3)  ?No results for input(s): GLUCAP in the last 72 hours.  ? ?I/O: ?I/O last 3 completed shifts: ?In: -  ?Out: 650 [Urine:650]  ? ?Assessment ?Postpartum Postoperative Day # 2  ?[name], [id number], [redacted]w[redacted]d, S/P  Primary LT Cesarean Section due to pt was admitted on 3/8 at [redacted] weeks pregnant for breech DIDi twins with dis-cordinent weights, BBB was IUGR. Pt was taken to the OR on 3/8 with Dr Mancel Bale for delivery of PCS, Baby A female in room doing well, baby boy B in NICU, desires in pt circ prior to discharge in NICU currently. EBL was 350-450mls, hgb drop of 9.6-8.5 on PO Iron and asymptomatic. Pt has h/o CHTN controlled on labetalol 100mg  BID PO through out entire pregnancy. Asymptomatic, and currently BP 133/82.  Pt stable. -1 [employer]. Breast Feeding. Hemodynamically Stable. ? ?Plan: ?Continue other mgmt as ordered ?CHTN: Continue labetalol 100mg  BID PO. Monitor BP, 1 week BP check with [facility] PP.  ?Baby female B in pt circ prior to discharge.  ?Anemia: PO Iron.  ?VTE Prophylactics: SCD, ambulated as tolerates.  ?Pain control: Motrin/Tylenol/Narcotics PRN ?Education given regarding options for contraception, including barrier methods, injectable contraception, IUD placement, oral contraceptives.  ?Plan for discharge tomorrow, Breastfeeding, Lactation consult, and Circumcision prior to discharge ? ?Dr. Mancel Bale to be updated on patient status ? ?[facility] NP-C, CNM ?05/12/2021, 11:57 AM ?  ?

## 2021-05-12 NOTE — Lactation Note (Signed)
This note was copied from a baby's chart. ?Lactation Consultation Note ? ?Patient Name: Samantha Alexander ?Today's Date: 05/12/2021 ?Reason for consult: Follow-up assessment;Late-preterm 34-36.6wks;Infant < 6lbs;Multiple gestation;Infant weight loss ?Age:29 hours ? ?P2 who states she is doing well.  Discussed feeding infant 8-12 times in 24 hours and with feeding cues.  Discussed supplementation chart with parents. Per mom she is pumping every 3 hours even during the night.  Mom states she is not getting any colostrum in the pump yet.  She has hand expressed but is also not getting any drops.  Reviewed breast massage and Encouraged mom to continue with the plan and that she is doing a great job.   ? ?Feeding plan: ?1- Feed baby 8-12 times in 24 hours and with feeding cues, wake baby up to feed if infant is not showing signs of hunger by the 3 hour mark ?2- Continue skin to skin  ?3- Pump after feeding every 3 hours for 15 minutes on initial setting.   ?4- Hand express as needed for supplementation or provide formula per supplementation guidelines.   ? ?Mom knows to call LC/RN for assistance.    ? ? ?Maternal Data ?Has patient been taught Hand Expression?: Yes ?Does the patient have breastfeeding experience prior to this delivery?: Yes ?How long did the patient breastfeed?: 4 months with older daughter, ? ?Feeding ?Mother's Current Feeding Choice: Breast Milk and Formula ?Nipple Type: Slow - flow ? ? ? ?Lactation Tools Discussed/Used ?Tools: Pump ?Breast pump type: Double-Electric Breast Pump ?Reason for Pumping: Supplement infant who is a late preterm infant ?Pumping frequency: every 3 hours even at  night ?Pumped volume: 0 mL (Per mom she is not having output while using the pump) ? ?Interventions ?Interventions: Breast feeding basics reviewed;Education;Breast massage;DEBP ? ?Discharge ?Pump: DEBP;Personal (Spectra S1) ? ?Consult Status ?Consult Status: Follow-up ?Date: 05/13/21 ?Follow-up type:  In-patient ? ? ? ?Forrestine Him ?05/12/2021, 11:45 AM ? ? ? ?

## 2021-05-12 NOTE — Lactation Note (Signed)
This note was copied from a baby's chart. ?Lactation Consultation Note ? ?Patient Name: Samantha Alexander ?Today's Date: 05/12/2021 ?Reason for consult: Late-preterm 34-36.6wks;Follow-up assessment;NICU baby;Infant < 6lbs;Other (Comment) (SGA, IUGR) ?Age:30 hours ? ?LC and LC student Glodean visited with mom of 30 hours old LPI NICU twin female, she reports that she's pumping consistently but she hasn't seen drops today. Mom will be discharged tomorrow, she has a pump for home use.  ? ?Offered assistance with latch but mom reported baby is not ready to latch yet. Asked mom to call for assistance when needed. Reviewed pumping schedule, supply/demand and feeding cues. ? ?Maternal Data ? Mom's supply is WNL although she mentioned that she got colostrum on day one with hand expression and pumping and she's not getting anything today. Reviewed lactogenesis II patterns and normalcy. ? ?Feeding ?Mother's Current Feeding Choice: Breast Milk and Formula ?Nipple Type: Nfant Extra Slow Flow (gold) ? ?Lactation Tools Discussed/Used ?Tools: Pump ?Breast pump type: Double-Electric Breast Pump ?Pump Education: Setup, frequency, and cleaning;Milk Storage ?Reason for Pumping: LPI in NICU ?Pumping frequency: 8 times/24 hours, but feedling discouraged because of zero output, explained normalcy ?Pumped volume: 0 mL ? ?Interventions ?Interventions: Breast feeding basics reviewed;DEBP;Education ? ?Plan of care ?Encouraged mom to continue pumping every 3 hours, at least 8 pumping sessions/24 hours ?Hand expression and breast massage were also encouraged prior pumping ?She'll put baby to breast when he's ready around feeding times strictly on feeding cues ? ?FOB present and supportive. All questions and concerns answered, family to call NICU LC PRN. ? ?Discharge ?Pump: DEBP;Personal (Spectra S1) ? ?Consult Status ?Consult Status: Follow-up ?Date: 05/12/21 ?Follow-up type: In-patient ? ? ?Jamiya Nims S Sheamus Hasting ?05/12/2021, 11:37 AM ? ? ? ?

## 2021-05-13 ENCOUNTER — Encounter (HOSPITAL_COMMUNITY): Payer: Self-pay | Admitting: Obstetrics and Gynecology

## 2021-05-13 MED ORDER — ONDANSETRON HCL 4 MG PO TABS
4.0000 mg | ORAL_TABLET | Freq: Three times a day (TID) | ORAL | Status: DC | PRN
  Administered 2021-05-13 (×2): 4 mg via ORAL
  Filled 2021-05-13 (×2): qty 1

## 2021-05-13 MED ORDER — OXYCODONE HCL 5 MG PO TABS: 5.0000 mg | ORAL_TABLET | ORAL | 0 refills | Status: DC | PRN

## 2021-05-13 MED ORDER — IBUPROFEN 600 MG PO TABS: 600.0000 mg | ORAL_TABLET | Freq: Four times a day (QID) | ORAL | 0 refills | Status: DC

## 2021-05-13 NOTE — Discharge Summary (Signed)
PCS OB Discharge Summary     Patient Name: Samantha Alexander DOB: November 07, 1990 MRN: [id number]  Date of admission: 05/10/2021 Delivering MD:    Samantha, Quinney Samantha Alexander [[id number]]  Samantha, Munier Alexander [[id number]]  Samantha Alexander  Date of delivery: 05/10/2021 Type of delivery: PCS  Newborn Data: Sex: Baby A Female and Baby B Female Circumcision: baby B female in NICU desires in pt circ prior to charge.    Alexander, Samantha Raynelle [[id number]]  Live born female  Birth Weight: 5 lb 2.9 oz (2350 g) APGAR: 8, 8  Newborn Delivery   Birth date/time: 05/10/2021 16:48:00 Delivery type: C-Section, Low Transverse Trial of labor: No C-section categorization: Primary       Samantha, Alexander [[id number]]  Live born female  Birth Weight: 3 lb 13 oz (1730 g) APGAR: 9, 9  Newborn Delivery   Birth date/time: 05/10/2021 16:49:00 Delivery type: C-Section, Low Transverse Trial of labor: No C-section categorization: Primary      Feeding: breast Infant being discharge to home with mother in stable condition.   Admitting diagnosis: Postpartum care following cesarean delivery [Z39.2] Intrauterine pregnancy: [redacted]w[redacted]d     Secondary diagnosis:  Principal Problem:   Postpartum care following cesarean delivery Active Problems:   Chronic peripheral venous hypertension   Twin gestation, dichorionic diamniotic   Discordant fetal growth in twin gestation   Breech presentation   Acute blood loss anemia                                Complications: None                                                              Intrapartum Procedures: cesarean: low cervical, transverse Postpartum Procedures: none Complications-Operative and Postpartum: none Augmentation: AROM and at time of delivery   History of Present Illness: [name] is a 31 y.o. female, G2P1103, who presents at [redacted]w[redacted]d weeks gestation. The patient has been followed at  [facility] and  [facility]  Her pregnancy has been complicated by:  Patient Active Problem List   Diagnosis Date Noted   Acute blood loss anemia 05/12/2021   Chronic peripheral venous hypertension 05/10/2021   Twin gestation, dichorionic diamniotic 05/10/2021   Discordant fetal growth in twin gestation 05/10/2021   Breech presentation 05/10/2021   Postpartum care following cesarean delivery 05/10/2021   Obesity in pregnancy 04/27/2021   Chronic hypertension in pregnancy 02/15/2021   History of pre-eclampsia 02/15/2021     Active Ambulatory Problems    Diagnosis Date Noted   Chronic hypertension in pregnancy 02/15/2021   History of pre-eclampsia 02/15/2021   Obesity in pregnancy 04/27/2021   Resolved Ambulatory Problems    Diagnosis Date Noted   Candida vaginitis 07/11/2017   Subchorionic hematoma in first trimester 07/11/2017   Intrauterine pregnancy 07/11/2017   Anemia complicating pregnancy 07/31/2017   Blood pressure elevated without history of HTN 03/07/2018   Vaginal delivery 03/09/2018   Gestational hypertension 03/10/2018   Normal postpartum course 03/10/2018   Postpartum anemia 03/10/2018   Hypertension in pregnancy, preeclampsia, severe, delivered/postpartum 03/13/2018   Postural dizziness with presyncope 02/15/2021   SOB (shortness of breath) 02/15/2021   Past Medical History:  Diagnosis Date   Abscess of vulva    Anemia    Headache    HPV (human papilloma virus) anogenital infection    Pregnancy induced hypertension      Hospital course:  Sceduled C/S   31 y.o. yo G2P1103 at 5322w0d was admitted to the hospital 05/10/2021 for scheduled cesarean section with the following indication: DiDI with breech .Delivery details are as follows:  Membrane Rupture Time/Date:    Samantha BienenstockMartinez, Samantha Alexander [[id number]][[id number]]  4:47 PM    Samantha GaussMartinez, Samantha Alexander [[id number]][[id number]]  4:48 PM ,   Samantha BienenstockMartinez, Samantha Alexander [[id number]][[id number]]  05/10/2021    Samantha GaussMartinez, Samantha Alexander [[id number]][[id number]]  05/10/2021   Delivery  Method:   Samantha BienenstockMartinez, Samantha Alexander [[id number]][[id number]]  C-Section, Low Transverse    Samantha GaussMartinez, Samantha Alexander [[id number]][[id number]]  C-Section, Low Transverse  Details of operation can be found in separate operative note.  Patient had an uncomplicated postpartum course.  She is ambulating, tolerating a regular diet, passing flatus, and urinating well. Patient is discharged home in stable condition on  05/13/21        Newborn Data: Birth date:   [name], Samantha Alexander [[id number]][[id number]]  05/10/2021    Samantha GaussMartinez, Samantha Alexander [[id number]][[id number]]  05/10/2021  Birth time:   Elizabeth PalauMartinez, Samantha Alexander [[id number]][[id number]]  4:48 PM    Samantha GaussMartinez, Samantha Alexander [[id number]][[id number]]  4:49 PM  Gender:   Samantha BienenstockMartinez, Samantha Alexander [[id number]][[id number]]  Female    Samantha GaussMartinez, Samantha Alexander [[id number]][[id number]]  Female  Living status:   [name], Samantha Alexander [[id number]][[id number]]  Living    Samantha GaussMartinez, Samantha Alexander [[id number]][[id number]]  Living  Apgars:   Samantha BienenstockMartinez, Samantha Alexander [[id number]][[id number]]  8    Samantha GaussMartinez, Samantha DundalkPequita [[id number]][[id number]]  [address] ,   Samantha, Samantha Alexander [[id number]][[id number]]  8    Samantha GaussMartinez, Samantha [place] [[id number]][[id number]]  9  Weight:   [name], Samantha Lauris [[id number]][[id number]]  2350 g    Samantha GaussMartinez, Samantha Meleny [[id number]][[id number]]  1730 g    Postpartum Postoperative DSceduled C/S   31 y.o. yo G2P1103 at 622w0d was admitted to the hospital 05/10/2021 for scheduled cesarean section with the following indication: DiDi breach.  .Delivery details are as follows:  Membrane Rupture Time/Date:    Elizabeth PalauMartinez, Samantha Alexander [[id number]][[id number]]  4:47 PM    Samantha GaussMartinez, Samantha Alexander [[id number]][[id number]]  4:48 PM ,   Samantha BienenstockMartinez, Samantha Alexander [[id number]][[id number]]  05/10/2021    Samantha GaussMartinez, Samantha Alexander [[id number]][[id number]]  05/10/2021   Delivery Method:   Samantha BienenstockMartinez, Samantha Adreanne [[id number]][[id number]]  C-Section, Low Transverse    Samantha GaussMartinez, Samantha Toniesha [[id number]][[id number]]  C-Section, Low Transverse  Details of operation can be found in separate operative note.  Patient had an uncomplicated postpartum course.  She is ambulating, tolerating a regular diet, passing flatus, and  urinating well. Patient is discharged home in stable condition on  05/13/21        Newborn Data: Birth date:   [name], Samantha Anona [[id number]][[id number]]  05/10/2021    Samantha GaussMartinez, Samantha Emma-Lee [[id number]][[id number]]  05/10/2021  Birth time:   Elizabeth PalauMartinez, Samantha Blake [[id number]][[id number]]  4:48 PM    Samantha GaussMartinez, Samantha Greidys [[id number]][[id number]]  4:49 PM  Gender:   Samantha BienenstockMartinez, Samantha Dewanna [[id number]][[id number]]  Female    Samantha GaussMartinez, Samantha Jone [[id number]][[id number]]  Female  Living status:   [name], Samantha Leshay [[id number]][[id number]]  Living    Samantha GaussMartinez, Samantha Anasofia [[id number]][[id number]]  Living  Apgars:   Samantha BienenstockMartinez, Samantha Keilani [[id number]][[id number]]  8    Samantha GaussMartinez, Samantha Britny [[id number]][[id number]]  [address] ,   Samantha BienenstockMartinez, Samantha Karrin [[id number]][[id number]]  8    Samantha GaussMartinez, Samantha Kala [[id number]][[id number]]  9  Weight:  Aceves, Samantha Janean [id number]  [place] g    Fawn, Com[place] Filicia [id number] g   ay # 3  [name], [id number], [redacted]w[redacted]d, S/P Primary LT Cesarean Section due to pt was admitted on 3/8 at [redacted] weeks pregnant for breech DIDi twins with dis-cordinent weights, BBB was IUGR. Pt was taken to the OR on 3/8 with Dr Mancel Bale for delivery of PCS, Baby A female in room doing well, baby boy B in NICU, desires in pt circ prior to discharge in NICU currently. EBL was 350-457mls, hgb drop of 9.6-8.5 on PO Iron and asymptomatic. Pt has h/o CHTN controlled on labetalol 100mg  BID PO through out entire pregnancy. Asymptomatic, and currently BP 137/81. Patient up ad lib, denies syncope or dizziness. Reports consuming regular diet without issues and denies N/V. Patient reports 0 bowel movement + passing flatus.  Denies issues with urination and reports bleeding is "lighter."  Patient is breastfeeding and reports going well.  Desires undecided for postpartum contraception.  Pain is being appropriately managed with use of po meds. Pt denies HA, RUQ pain or vision changes, denies dizziness, heart palpitations or fatigue.Pt meets criteria for discharge and is medically stable to go home, pt  endorses she will go to [facility] to be with her other baby in NICU.   Physical exam  Vitals:   05/12/21 0529 05/12/21 1421 05/12/21 2129 05/13/21 0605  BP: 133/82 125/78 137/81 137/81  Pulse: 66 62 83 78  Resp: 18 17 18 18   Temp: 98.7 F (37.1 C) 98.2 F (36.8 C) 98.2 F (36.8 C) 98 F (36.7 C)  TempSrc: Oral Oral Oral Oral  SpO2:  99% 98% 100%  Weight:      Height:       General: alert, cooperative, and no distress Lochia: appropriate Uterine Fundus: firm Incision: Healing well with no significant drainage, No significant erythema, Dressing is clean, dry, and intact, honeycomb dressing CDI Perineum: intact DVT Evaluation: No evidence of DVT seen on physical exam. Negative Homan's sign. No cords or calf tenderness. No significant calf/ankle edema.  Labs: Lab Results  Component Value Date   WBC 11.0 (H) 05/11/2021   HGB 8.5 (L) 05/11/2021   HCT 27.4 (L) 05/11/2021   MCV 73.1 (L) 05/11/2021   PLT 237 05/11/2021   CMP Latest Ref Rng & Units 05/10/2021  Glucose 70 - 99 mg/dL 77  BUN 6 - 20 mg/dL 8  Creatinine 0.44 - 1.00 mg/dL 0.79  Sodium 135 - 145 mmol/L 135  Potassium 3.5 - 5.1 mmol/L 4.1  Chloride 98 - 111 mmol/L 103  CO2 22 - 32 mmol/L 23  Calcium 8.9 - 10.3 mg/dL 8.9  Total Protein 6.5 - 8.1 g/dL 5.9(L)  Total Bilirubin 0.3 - 1.2 mg/dL 0.4  Alkaline Phos 38 - 126 U/L 183(H)  AST 15 - 41 U/L 29  ALT 0 - 44 U/L 24    Date of discharge: 05/13/2021 Discharge Diagnoses:  Preterm PCS with DiDi twins Discharge instruction: per After Visit Summary and "Baby and Me Booklet".  After visit meds:   Activity:           unrestricted and pelvic rest Advance as tolerated. Pelvic rest for 6 weeks.  Diet:                routine Medications: PNV, Ibuprofen, Colace, Iron, and oxy ir, labetalol 100mg  BID Postpartum contraception: Undecided Condition:  Pt discharge to home with baby in stable CHTN: 1 week BP check for CHTN.  Meds: Allergies as of 05/13/2021        Reactions   Fentanyl Itching        Medication List     STOP taking these medications    BABY ASPIRIN PO   Doxylamine-Pyridoxine 10-10 MG Tbec   prochlorperazine 10 MG tablet Commonly known as: COMPAZINE   promethazine 25 MG tablet Commonly known as: PHENERGAN   scopolamine 1 MG/3DAYS Commonly known as: TRANSDERM-SCOP   terconazole 0.4 % vaginal cream Commonly known as: TERAZOL 7       TAKE these medications    cholecalciferol 25 MCG (1000 UNIT) tablet Commonly known as: VITAMIN D3 Take 1,000 Units by mouth daily.   Vitamin D3 50 MCG (2000 UT) capsule TAKE 2 CAPSULES BY MOUTH ONCE DAILY   ibuprofen 600 MG tablet Commonly known as: ADVIL Take 1 tablet (600 mg total) by mouth every 6 (six) hours.   iron polysaccharides 150 MG capsule Commonly known as: NIFEREX Take 1 capsule (150 mg total) by mouth daily.   labetalol 100 MG tablet Commonly known as: NORMODYNE Take 100 mg by mouth 2 (two) times daily.   omeprazole 10 MG capsule Commonly known as: PRILOSEC Take 10 mg by mouth daily.   ondansetron 4 MG tablet Commonly known as: ZOFRAN Take 4 mg by mouth 2 (two) times daily.   oxyCODONE 5 MG immediate release tablet Commonly known as: Oxy IR/ROXICODONE Take 1 tablet (5 mg total) by mouth every 4 (four) hours as needed for moderate pain.   PrePLUS 27-1 MG Tabs Take by mouth.               Discharge Care Instructions  (From admission, onward)           Start     Ordered   05/13/21 0000  Discharge wound care:       Comments: Take dressing off on day 5-7 postpartum.  Report increased drainage, redness or warmth. Clean with water, let soap trickle down body. Can leave steri strips on until they fall off or take them off gently at day 10. Keep open to air, clean and dry.   05/13/21 1157            Discharge Follow Up:   Follow-up [facility]. Schedule an appointment as soon as possible for  a visit in 1 week(s).   Specialty: Obstetrics and Alexander Why: 1 week BP check. Contact information: [place]. [address] Hollis Solv[place] [phone] Wright, NP-C, CNM 05/13/2021, 11:58 AM  Noralyn Pick, [place]

## 2021-05-13 NOTE — Lactation Note (Signed)
This note was copied from a baby's chart. ?Lactation Consultation Note ? ?Patient Name: Samantha Alexander ?Today's Date: 05/13/2021 ?Reason for consult: Follow-up assessment;Late-preterm 34-36.6wks ?Age:30 hours ? ?I observed infant bottle feed. The Nfant Standard nipple was too fast for infant. I switched "Samantha Alexander" to the Nfant Slow flow nipple and she did well. Initially, she required external pacing, but by the end of the feeding, she was able to pace herself. She fed comfortably with soft swallows & took 23 mLs.  ? ?I anticipate that Samantha Alexander will be able to continue to advance her feedings at home with this nipple, which parents understand is equivalent to the Dr. Saul Fordyce Preemie nipple.  ? ?SLP was not available to do feeding assessment.   ? ?Larkin Ina ?05/13/2021, 4:10 PM ? ? ? ?

## 2021-05-13 NOTE — Lactation Note (Signed)
This note was copied from a baby's chart. ? ?NICU Lactation Consultation Note ? ?Patient Name: Samantha Alexander ?Today's Date: 05/13/2021 ?Age:30 hours ? ? ?Subjective ?Reason for consult: Follow-up assessment; Maternal discharge ?Mother continues to pump for infant in NICU. She is also breast/bottle feeding baby A who with her on 32. Mother endorses "drops" of colostrum today and a feeling of milk letdown while baby A is at breast. We reviewed normalcy on day 2 and I encouraged her to continue frequent breast stimulation. Mother may d/c today and plans to be present in NICU. She is aware to bring pump parts to NICU.  ? ?I briefly reinforced earlier education for baby A and reviewed her poc; however; MBU LC will consult later today. Mother is aware of LC services in NICU prn.  ? ? ?Objective ?Infant data: ?Mother's Current Feeding Choice: Breast Milk and Formula ? ?Infant feeding assessment ?Scale for Readiness: 2 ?Scale for Quality: 3 ? ?  ?Maternal data: ?[id number]  ?C-Section, Low Transverse ?Pumping frequency: q3 ?Pumped volume: 1 mL ? ?Pump: DEBP, Personal (Spectra S1) ? ?Assessment ?Infant: ?LATCH Score: 6 ? ?Maternal: ?Milk volume: Normal ? ? ?Intervention/Plan ?Interventions: Education; Infant Driven Feeding Algorithm education ? ?Tools: Pump ?Pump Education: Setup, frequency, and cleaning; Milk Storage ? ?Plan: ?Consult Status: Follow-up ? ?NICU Follow-up type: Verify onset of copious milk; Verify absence of engorgement ? ?Mother to continue pumping q3 and bring any EBM to NICU. ? ?Samantha Alexander ?05/13/2021, 10:13 AM ?

## 2021-05-15 ENCOUNTER — Other Ambulatory Visit (HOSPITAL_COMMUNITY)
Admission: RE | Admit: 2021-05-15 | Discharge: 2021-05-15 | Disposition: A | Discharge status: Home / Self Care | Payer: Medicaid Other | Source: Ambulatory Visit | Attending: Obstetrics & Gynecology | Admitting: Obstetrics & Gynecology

## 2021-05-18 ENCOUNTER — Ambulatory Visit: Payer: Medicaid Other

## 2021-05-20 ENCOUNTER — Telehealth (HOSPITAL_COMMUNITY): Payer: Self-pay

## 2021-05-20 NOTE — Telephone Encounter (Signed)
"  Doing well. We went to the OB-GYN and they checked my incision. They said everything is good." Patient declines questions or concerns about her healing.       ?"She's doing well. Eating well and sleeping well. She has a crib and a bassinet for sleep." RN reviewed ABC's of safe sleep with patient. Patient declines any questions or concerns about baby. ? ?Other twin is in [facility] NICU.  ? ?EPDS score is 3. ? ?Marcelino Duster Aysiah Jurado,RN3,MSN,RNC-MNN ?05/20/2021, ?

## 2021-05-23 ENCOUNTER — Ambulatory Visit: Payer: Self-pay

## 2021-05-26 ENCOUNTER — Telehealth (INDEPENDENT_AMBULATORY_CARE_PROVIDER_SITE_OTHER): Payer: Medicaid Other | Admitting: Cardiology

## 2021-05-26 ENCOUNTER — Other Ambulatory Visit: Payer: Self-pay

## 2021-05-26 VITALS — Ht 68.0 in | Wt 232.0 lb

## 2021-05-26 DIAGNOSIS — O99215 Obesity complicating the puerperium: Secondary | ICD-10-CM | POA: Diagnosis not present

## 2021-05-26 DIAGNOSIS — I10 Essential (primary) hypertension: Secondary | ICD-10-CM

## 2021-05-26 DIAGNOSIS — E669 Obesity, unspecified: Secondary | ICD-10-CM

## 2021-05-26 DIAGNOSIS — O165 Unspecified maternal hypertension, complicating the puerperium: Secondary | ICD-10-CM

## 2021-05-26 NOTE — Patient Instructions (Addendum)
Medication Instructions:  ?Your physician recommends that you continue on your current medications as directed. Please refer to the Current Medication list given to you today.  ?Please take your blood pressure daily send in a MyChart message after 1 week. Please include heart rates.  ?Contact us if you have back to back readings that are higher than 140/80 ?*If you need a refill on your cardiac medications before your next appointment, please call your pharmacy* ? ? ?Lab Work: ?None ?If you have labs (blood work) drawn today and your tests are completely normal, you will receive your results only by: ?MyChart Message (if you have MyChart) OR ?A paper copy in the mail ?If you have any lab test that is abnormal or we need to change your treatment, we will call you to review the results. ? ? ?Testing/Procedures: ?None ? ? ?Follow-Up: ?At [facility], you and your health needs are our priority.  As part of our continuing mission to provide you with exceptional heart care, we have created designated Provider Care Teams.  These Care Teams include your primary Cardiologist (physician) and Advanced Practice Providers (APPs -  Physician Assistants and Nurse Practitioners) who all work together to provide you with the care you need, when you need it. ? ?We recommend signing up for the patient portal called "MyChart".  Sign up information is provided on this After Visit Summary.  MyChart is used to connect with patients for Virtual Visits (Telemedicine).  Patients are able to view lab/test results, encounter notes, upcoming appointments, etc.  Non-urgent messages can be sent to your provider as well.   ?To learn more about what you can do with MyChart, go to [email or web address].   ? ?Your next appointment:   ?12 week(s) ? ?The format for your next appointment:   ?In Person ? ?Provider:   ?Thomasene Ripple, DO   ? ? ?Other Instructions ?  ?

## 2021-05-26 NOTE — Progress Notes (Signed)
?[facility] ? ?Follow Up Note ? ? ?Date:  05/28/2021  ? ?ID:  Samantha Alexander, DOB [birth year], MRN [id number] ? ?PCP:  Pcp, No ?  ?[facility] Providers ?Cardiologist:  Thomasene Ripple, DO  ?Electrophysiologist:  None      ? ? ?Referring MD: No ref. provider found  ? ?Chief Complaint: " I am ok" ? ?The patient is at home. ?In the office ? ?Virtual Visit via Video  Note . I connected with the patient today by a   video enabled telemedicine application and verified that I am speaking with the correct person using two identifiers. ? ? ?History of Present Illness:   ? ?Samantha Alexander is a 31 y.o. female [G2P1103] who returns for follow up of postpartum hypertension.  Last visit on April 27, 2021 pressure was within limits. ? ?The patient is postpartum, delivered via C-section on May 10, 2021.  She and the baby are doing well. ? ? ?Prior CV Studies Reviewed: ?The following studies were reviewed today: ?TTE 03/13/2021 ?FINDINGS  ? Left Ventricle: Left ventricular ejection fraction, by estimation, is 70  ?to 75%. Left ventricular ejection fraction by 3D volume is 71 %. The left  ?ventricle has hyperdynamic function. The left ventricle has no regional  ?wall motion abnormalities. The  ?left ventricular internal cavity size was normal in size. There is no left  ?ventricular hypertrophy. Left ventricular diastolic parameters were  ?normal.  ? ?Right Ventricle: The right ventricular size is normal. No increase in  ?right ventricular wall thickness. Right ventricular systolic function is  ?normal. There is normal pulmonary artery systolic pressure. The tricuspid  ?regurgitant velocity is 2.08 m/s, and  ? with an assumed right atrial pressure of 3 mmHg, the estimated right  ?ventricular systolic pressure is 20.3 mmHg.  ? ?Left Atrium: Left atrial size was normal in size.  ? ?Right Atrium: Right atrial size was normal in size.  ? ?Pericardium: There is no evidence of pericardial effusion.  ? ?Mitral Valve: The  mitral valve is normal in structure. No evidence of  ?mitral valve regurgitation. No evidence of mitral valve stenosis.  ? ?Tricuspid Valve: The tricuspid valve is normal in structure. Tricuspid  ?valve regurgitation is not demonstrated. No evidence of tricuspid  ?stenosis.  ? ?Aortic Valve: The aortic valve is tricuspid. Aortic valve regurgitation is  ?not visualized. No aortic stenosis is present.  ? ?Pulmonic Valve: The pulmonic valve was not well visualized. Pulmonic valve  ?regurgitation is not visualized. No evidence of pulmonic stenosis.  ? ?Aorta: The aortic root and ascending aorta are structurally normal, with  ?no evidence of dilitation.  ? ?Venous: The inferior vena cava is normal in size with greater than 50%  ?respiratory variability, suggesting right atrial pressure of 3 mmHg.  ? ?IAS/Shunts: No atrial level shunt detected by color flow Doppler.  ?  ?Zio monitor  ?Patch Wear Time:  14 days and 0 hours (2022-12-19T09:25:49-0500 to 2023-01-02T09:25:53-0500) ?  ?Patient had a min HR of 62 bpm, max HR of 152 bpm, and avg HR of 99 bpm. Predominant underlying rhythm was Sinus Rhythm. First Degree AV Block was present. Isolated SVEs were rare (<1.0%), SVE Couplets were rare (<1.0%), and no SVE Triplets were present. ? Isolated VEs were rare (<1.0%), and no VE Couplets or VE Triplets were present. ?  ?Symptoms associated with sinus rhythm and sinus tachycardia. ?  ?Conclusion: Normal/unremarkable study with no evidence of arrhythmia. ? ?Past Medical History:  ?Diagnosis Date  ? Abscess of vulva   ?  Anemia   ? Headache   ? HPV (human papilloma virus) anogenital infection   ? Pregnancy induced hypertension   ? ? ?Past Surgical History:  ?Procedure Laterality Date  ? CESAREAN SECTION MULTI-GESTATIONAL N/A 05/10/2021  ? Procedure: CESAREAN SECTION MULTI-GESTATIONAL;  Surgeon: Osborn Cohooberts, Angela, MD;  Location: [facility];  Service: Obstetrics;  Laterality: N/A;  ? hemorrhoid ligation    ? WISDOM TOOTH EXTRACTION    ?    ? ?OB History   ? ? Gravida  ?2  ? Para  ?2  ? Term  ?1  ? Preterm  ?1  ? AB  ?   ? Living  ?3  ?  ? ? SAB  ?   ? IAB  ?   ? Ectopic  ?   ? Multiple  ?1  ? Live Births  ?3  ?   ?  ?  ?    ? ? ?Current Medications: ?Current Meds  ?Medication Sig  ? Cholecalciferol (VITAMIN D3) 50 MCG (2000 UT) capsule Take 2,000 Units by mouth daily.  ? ibuprofen (ADVIL) 600 MG tablet Take 1 tablet (600 mg total) by mouth every 6 (six) hours.  ? iron polysaccharides (NIFEREX) 150 MG capsule Take 1 capsule (150 mg total) by mouth daily.  ? labetalol (NORMODYNE) 100 MG tablet Take 100 mg by mouth 2 (two) times daily.  ? ondansetron (ZOFRAN) 4 MG tablet Take 4 mg by mouth 2 (two) times daily.  ? OVER THE COUNTER MEDICATION daily. Lactation supplements  ? oxyCODONE (OXY IR/ROXICODONE) 5 MG immediate release tablet Take 1 tablet (5 mg total) by mouth every 4 (four) hours as needed for moderate pain.  ?  ? ?Allergies:   Fentanyl  ? ?Social History  ? ?Socioeconomic History  ? Marital status: Single  ?  Spouse name: Not on file  ? Number of children: Not on file  ? Years of education: Not on file  ? Highest education level: Not on file  ?Occupational History  ? Not on file  ?Tobacco Use  ? Smoking status: Never  ? Smokeless tobacco: Never  ?Vaping Use  ? Vaping Use: Never used  ?Substance and Sexual Activity  ? Alcohol use: Never  ? Drug use: Never  ? Sexual activity: Not Currently  ?Other Topics Concern  ? Not on file  ?Social History Narrative  ? Not on file  ? ?Social Determinants of Health  ? ?Financial Resource Strain: Not on file  ?Food Insecurity: Not on file  ?Transportation Needs: Not on file  ?Physical Activity: Not on file  ?Stress: Not on file  ?Social Connections: Not on file  ?  ? ? ?Family History  ?Problem Relation Age of Onset  ? Hypertension Mother   ? Diabetes Mother   ? Hypertension Father   ? Diabetes Father   ? Diabetes Sister   ? Lupus Sister   ?   ? ?ROS:   ?Please see the history of present illness.    ? ?All other  systems reviewed and are negative. ? ? ?Labs/EKG Reviewed:   ? ?EKG:   ?EKG is was not ordered today.   ? ?Recent Labs: ?05/10/2021: ALT 24; BUN 8; Creatinine, Ser 0.79; Potassium 4.1; Sodium 135 ?05/11/2021: Hemoglobin 8.5; Platelets 237  ? ?Recent Lipid Panel ?No results found for: CHOL, TRIG, HDL, CHOLHDL, LDLCALC, LDLDIRECT ? ?Physical Exam:   ? ?VS:  Ht 5\' 8"  (1.727 m)   Wt 232 lb (105.2 kg)   BMI 35.28 kg/m?    ? ?  Wt Readings from Last 3 Encounters:  ?05/26/21 232 lb (105.2 kg)  ?05/09/21 250 lb (113.4 kg)  ?05/10/21 246 lb 9.6 oz (111.9 kg)  ?  ? ? ?Risk Assessment/Risk Calculators:   ? ?  ?  ?   ?  ?  ? ? ?ASSESSMENT & PLAN:   ? ?Postpartum visit  ?Hypertension  ? ? ?I spoke with the patient today postpartum she has not been taking her blood pressure.  Unfortunately today she did not take her blood pressure for the visit.  I encouraged the patient about taking her blood pressure daily.  I advised her that she is still in the window of potential postpartum hypertension and also her about importance of taking her blood pressure. ? ?She will take her blood pressure for me and send me the information in the next 2 weeks. ? ?Total time spent 12 minutes. ? ?Patient Instructions  ?Medication Instructions:  ?Your physician recommends that you continue on your current medications as directed. Please refer to the Current Medication list given to you today.  ?Please take your blood pressure daily send in a MyChart message after 1 week. Please include heart rates.  ?Contact us if you have back to back readings that are higher than 140/80 ?*If you need a refill on your cardiac medications before your next appointment, please call your pharmacy* ? ? ?Lab Work: ?None ?If you have labs (blood work) drawn today and your tests are completely normal, you will receive your results only by: ?MyChart Message (if you have MyChart) OR ?A paper copy in the mail ?If you have any lab test that is abnormal or we need to change your  treatment, we will call you to review the results. ? ? ?Testing/Procedures: ?None ? ? ?Follow-Up: ?At [facility], you and your health needs are our priority.  As part of our continuing mission to provide

## 2021-05-29 ENCOUNTER — Ambulatory Visit: Payer: Self-pay

## 2021-05-29 ENCOUNTER — Other Ambulatory Visit: Payer: Self-pay

## 2021-05-29 ENCOUNTER — Encounter (HOSPITAL_COMMUNITY): Payer: Self-pay | Admitting: Obstetrics & Gynecology

## 2021-05-29 ENCOUNTER — Inpatient Hospital Stay (HOSPITAL_COMMUNITY)
Admission: AD | Admit: 2021-05-29 | Discharge: 2021-05-29 | Disposition: A | Discharge status: Home / Self Care | Payer: Medicaid Other | Attending: Obstetrics & Gynecology | Admitting: Obstetrics & Gynecology

## 2021-05-29 DIAGNOSIS — O165 Unspecified maternal hypertension, complicating the puerperium: Secondary | ICD-10-CM | POA: Diagnosis not present

## 2021-05-29 DIAGNOSIS — R519 Headache, unspecified: Secondary | ICD-10-CM | POA: Diagnosis present

## 2021-05-29 LAB — COMPREHENSIVE METABOLIC PANEL
ALT: 24 U/L (ref 0–44)
AST: 24 U/L (ref 15–41)
Albumin: 3.9 g/dL (ref 3.5–5.0)
Alkaline Phosphatase: 118 U/L (ref 38–126)
Anion gap: 7 (ref 5–15)
BUN: 9 mg/dL (ref 6–20)
CO2: 26 mmol/L (ref 22–32)
Calcium: 9.6 mg/dL (ref 8.9–10.3)
Chloride: 107 mmol/L (ref 98–111)
Creatinine, Ser: 0.9 mg/dL (ref 0.44–1.00)
GFR, Estimated: >60 mL/min (ref >60)
Glucose, Bld: 81 mg/dL (ref 70–99)
Potassium: 3.7 mmol/L (ref 3.5–5.1)
Sodium: 140 mmol/L (ref 135–145)
Total Bilirubin: 0.6 mg/dL (ref 0.3–1.2)
Total Protein: 7 g/dL (ref 6.5–8.1)

## 2021-05-29 LAB — CBC
HCT: 35.1 % — ABNORMAL LOW (ref 36.0–46.0)
Hemoglobin: 10.7 g/dL — ABNORMAL LOW (ref 12.0–15.0)
MCH: 22.9 pg — ABNORMAL LOW (ref 26.0–34.0)
MCHC: 30.5 g/dL (ref 30.0–36.0)
MCV: 75 fL — ABNORMAL LOW (ref 80.0–100.0)
Platelets: 339 10*3/uL (ref 150–400)
RBC: 4.68 MIL/uL (ref 3.87–5.11)
RDW: 19 % — ABNORMAL HIGH (ref 11.5–15.5)
WBC: 6.4 10*3/uL (ref 4.0–10.5)
nRBC: 0 % (ref 0.0–0.2)

## 2021-05-29 MED ORDER — NIFEDIPINE ER OSMOTIC RELEASE 30 MG PO TB24
30.0000 mg | ORAL_TABLET | Freq: Once | ORAL | Status: AC
  Administered 2021-05-29: 30 mg via ORAL
  Filled 2021-05-29: qty 1

## 2021-05-29 MED ORDER — KETOROLAC TROMETHAMINE 30 MG/ML IJ SOLN
30.0000 mg | Freq: Once | INTRAMUSCULAR | Status: AC
  Administered 2021-05-29: 30 mg via INTRAVENOUS
  Filled 2021-05-29: qty 1

## 2021-05-29 MED ORDER — LABETALOL HCL 5 MG/ML IV SOLN: 20.0000 mg | INTRAVENOUS | Status: DC | PRN

## 2021-05-29 MED ORDER — HYDRALAZINE HCL 20 MG/ML IJ SOLN: 10.0000 mg | INTRAMUSCULAR | Status: DC | PRN

## 2021-05-29 MED ORDER — LABETALOL HCL 5 MG/ML IV SOLN: 40.0000 mg | INTRAVENOUS | Status: DC | PRN

## 2021-05-29 MED ORDER — HYDRALAZINE HCL 20 MG/ML IJ SOLN
5.0000 mg | INTRAMUSCULAR | Status: DC | PRN
  Administered 2021-05-29: 5 mg via INTRAVENOUS
  Filled 2021-05-29: qty 1

## 2021-05-29 MED ORDER — NIFEDIPINE ER OSMOTIC RELEASE 30 MG PO TB24: 30.0000 mg | ORAL_TABLET | Freq: Every day | ORAL | 2 refills | Status: DC

## 2021-05-29 NOTE — Lactation Note (Signed)
This note was copied from a baby's chart. ?Lactation Consultation Note ? ?Patient Name: Samantha Alexander ?Today's Date: 05/29/2021 ?Reason for consult: Weekly NICU follow-up;Other (Comment);NICU baby;Infant < 6lbs;Early term 37-38.6wks;Multiple gestation (SGA IUGR) ?Age:30 wk.o. ? ?Mother still working on pumping as supply has recently lessened;suspected "plugged duct" LC observed and examined to confirm there was no plugged duct but educated Mom on proper self breast examinations to check for changes in tissue especially area where previous biopsy(R breast 9 o'clock)no palable scar tissue at this point ? ? ?Maternal Data ? Supply is BNL ?Marland Kitchen  ?Feeding ?Mother's Current Feeding Choice: Formula ? ? ?Lactation Tools Discussed/Used ?Tools: Pump ?Breast pump type: Double-Electric Breast Pump ?Pump Education: Setup, frequency, and cleaning;Milk Storage ?Reason for Pumping: NICU ?Pumping frequency: "power pumping" 3 cycles a day (11am, 3 and 4 pm) pumping 10 min then resting 32min ?Pumped volume:  (Mom states that she is recieving "drops" with pumping, 66ml with hand expression and haaka 1/2 oz after bottle feeding Baby A at home) ? ?Interventions ?Interventions: Education;Hand pump;DEBP ?Recommendations of increasing pumping frequency and duration. Continue putting Baby A to breast at home and using Haaka pump on other breast  ?Discharge ?Pump: DEBP;Personal (Spectra S1) ? ?Consult Status ?Consult Status: Follow-up ?Date: 05/29/21 ?Follow-up type: In-patient ? ? ? ?Cleve Paolillo ?05/29/2021, 1:45 PM ? ? ? ?

## 2021-05-29 NOTE — MAU Provider Note (Signed)
?History  ?  ? ?CSN: [id number] ? ?Arrival date and time: 05/29/21 1529 ? ? Event Date/Time  ? First Provider Initiated Contact with Patient 05/29/21 1603   ?  ? ?Chief Complaint  ?Patient presents with  ? Headache  ? Hypertension  ? ?HPI ?Samantha Alexander is a 31 y.o. G2P1103 postpartum from a c/s on 3/8 who presents for evaluation of a headache and hypertension. She reports she has been checking her BP daily and today it was 159/109. She reports a headache that she rates a 5/10 and has not treated. She has not eaten today. ? ?She has CHTN and was discharged home on Labetalol 100mg  BID that she has not taken today. She denies any visual changes or epigastric pain. ? ?OB History   ? ? Gravida  ?2  ? Para  ?2  ? Term  ?1  ? Preterm  ?1  ? AB  ?   ? Living  ?3  ?  ? ? SAB  ?   ? IAB  ?   ? Ectopic  ?   ? Multiple  ?1  ? Live Births  ?3  ?   ?  ?  ? ? ?Past Medical History:  ?Diagnosis Date  ? Abscess of vulva   ? Anemia   ? Headache   ? HPV (human papilloma virus) anogenital infection   ? Pregnancy induced hypertension   ? ? ?Past Surgical History:  ?Procedure Laterality Date  ? CESAREAN SECTION MULTI-GESTATIONAL N/A 05/10/2021  ? Procedure: CESAREAN SECTION MULTI-GESTATIONAL;  Surgeon: 07/10/2021, MD;  Location: [facility] LD [facility]S;  Service: Obstetrics;  Laterality: N/A;  ? hemorrhoid ligation    ? WISDOM TOOTH EXTRACTION    ? ? ?Family History  ?Problem Relation Age of Onset  ? Hypertension Mother   ? Diabetes Mother   ? Hypertension Father   ? Diabetes Father   ? Diabetes Sister   ? Lupus Sister   ? ? ?Social History  ? ?Tobacco Use  ? Smoking status: Never  ? Smokeless tobacco: Never  ?Vaping Use  ? Vaping Use: Never used  ?Substance Use Topics  ? Alcohol use: Never  ? Drug use: Never  ? ? ?Allergies:  ?Allergies  ?Allergen Reactions  ? Fentanyl Itching  ? ? ?No medications prior to admission.  ? ? ?Review of Systems  ?Constitutional: Negative.  Negative for fatigue and fever.  ?HENT: Negative.    ?Respiratory: Negative.   Negative for shortness of breath.   ?Cardiovascular: Negative.  Negative for chest pain.  ?Gastrointestinal: Negative.  Negative for abdominal pain, constipation, diarrhea, nausea and vomiting.  ?Genitourinary: Negative.  Negative for dysuria, vaginal bleeding and vaginal discharge.  ?Neurological:  Positive for headaches. Negative for dizziness.  ?Physical Exam  ? ?Blood pressure 140/90, pulse 81, temperature 98.3 ?F (36.8 ?C), temperature source Oral, resp. rate 14, height 5\' 8"  (1.727 m), weight 102 kg, SpO2 100 %, currently breastfeeding. ? ?Patient Vitals for the past 24 hrs: ? BP Temp Temp src Pulse Resp SpO2 Height Weight  ?05/29/21 1745 140/90 -- -- 81 14 100 % -- --  ?05/29/21 1730 138/88 -- -- 83 -- -- -- --  ?05/29/21 1715 138/86 -- -- 78 -- -- -- --  ?05/29/21 1700 (!) 141/94 -- -- 74 -- -- -- --  ?05/29/21 1647 (!) 154/101 -- -- 77 -- -- -- --  ?05/29/21 1632 (!) 155/100 -- -- 76 -- -- -- --  ?05/29/21 1617 (!) 146/95 -- -- 76 -- -- -- --  ?  05/29/21 1603 (!) 161/105 -- -- 74 -- -- -- --  ?05/29/21 1543 (!) 161/101 98.3 ?F (36.8 ?C) Oral 64 18 100 % -- --  ?05/29/21 1541 -- -- -- -- -- -- 5\' 8"  (1.727 m) 102 kg  ? ? ? ?Physical Exam ?Vitals and nursing note reviewed.  ?Constitutional:   ?   General: She is not in acute distress. ?   Appearance: She is well-developed.  ?HENT:  ?   Head: Normocephalic.  ?Eyes:  ?   Pupils: Pupils are equal, round, and reactive to light.  ?Cardiovascular:  ?   Rate and Rhythm: Normal rate and regular rhythm.  ?   Heart sounds: Normal heart sounds.  ?Pulmonary:  ?   Effort: Pulmonary effort is normal. No respiratory distress.  ?   Breath sounds: Normal breath sounds.  ?Abdominal:  ?   General: Bowel sounds are normal. There is no distension.  ?   Palpations: Abdomen is soft.  ?   Tenderness: There is no abdominal tenderness.  ?Skin: ?   General: Skin is warm and dry.  ?Neurological:  ?   Mental Status: She is alert and oriented to person, place, and time.  ?   Motor: No  abnormal muscle tone.  ?   Coordination: Coordination normal.  ?   Deep Tendon Reflexes: Reflexes are normal and symmetric. Reflexes normal.  ?Psychiatric:     ?   Mood and Affect: Mood normal.     ?   Behavior: Behavior normal.     ?   Thought Content: Thought content normal.     ?   Judgment: Judgment normal.  ? ? ?MAU Course  ?Procedures ?Results for orders placed or performed during the hospital encounter of 05/29/21 (from the past 24 hour(s))  ?CBC     Status: Abnormal  ? Collection Time: 05/29/21  4:12 PM  ?Result Value Ref Range  ? WBC 6.4 4.0 - 10.5 K/uL  ? RBC 4.68 3.87 - 5.11 MIL/uL  ? Hemoglobin 10.7 (L) 12.0 - 15.0 g/dL  ? HCT 35.1 (L) 36.0 - 46.0 %  ? MCV 75.0 (L) 80.0 - 100.0 fL  ? MCH 22.9 (L) 26.0 - 34.0 pg  ? MCHC 30.5 30.0 - 36.0 g/dL  ? RDW 19.0 (H) 11.5 - 15.5 %  ? Platelets 339 150 - 400 K/uL  ? nRBC 0.0 0.0 - 0.2 %  ?Comprehensive metabolic panel     Status: None  ? Collection Time: 05/29/21  4:12 PM  ?Result Value Ref Range  ? Sodium 140 135 - 145 mmol/L  ? Potassium 3.7 3.5 - 5.1 mmol/L  ? Chloride 107 98 - 111 mmol/L  ? CO2 26 22 - 32 mmol/L  ? Glucose, Bld 81 70 - 99 mg/dL  ? BUN 9 6 - 20 mg/dL  ? Creatinine, Ser 0.90 0.44 - 1.00 mg/dL  ? Calcium 9.6 8.9 - 10.3 mg/dL  ? Total Protein 7.0 6.5 - 8.1 g/dL  ? Albumin 3.9 3.5 - 5.0 g/dL  ? AST 24 15 - 41 U/L  ? ALT 24 0 - 44 U/L  ? Alkaline Phosphatase 118 38 - 126 U/L  ? Total Bilirubin 0.6 0.3 - 1.2 mg/dL  ? GFR, Estimated >60 >60 mL/min  ? Anion gap 7 5 - 15  ?  ?MDM ?CBC, CMP ?Preeclampsia focused order set ?IV established due to arrival severe range BPs ?Toradol IV ?Food tray given ?Procardia 30mg  XL ? ?Patient reports resolution of HA ? ?  Consulted with Dr. Adrian Blackwater- ok to discharge home with new BP medication regimen and close follow up in the office. Encouraged proper PO intake and hydration ? ?Assessment and Plan  ? ?1. Postpartum hypertension   ? ?-Discharge home in stable condition ?-Rx for procardia sent to pharmacy ?-Preeclampsia  precautions discussed ?-Patient advised to follow-up with OB in a week for a BP check. CNM notified.  ?-Patient may return to MAU as needed or if her condition were to change or worsen ? ? ?Rolm Bookbinder CNM ?05/29/2021, 6:11 PM  ?

## 2021-05-29 NOTE — MAU Note (Signed)
Samantha Alexander is a 31 y.o. here in MAU reporting: s/p c/s on 3/8. Has been checking BP at home because she has a hx of PP pre-eclampsia. Last night was 159/109. Has not checked it today. Intermittent headaches, currently it is mild. No RUQ pain or visual changes.  ? ?Onset of complaint: ongoing ? ?Pain score: 5/10 ? ?Vitals:  ? 05/29/21 1543  ?BP: (!) 161/101  ?Pulse: 64  ?Resp: 18  ?Temp: 98.3 ?F (36.8 ?C)  ?SpO2: 100%  ?   ?Lab orders placed from triage: none ? ?

## 2021-06-01 ENCOUNTER — Telehealth: Payer: Self-pay | Admitting: Cardiology

## 2021-06-01 MED ORDER — LABETALOL HCL 100 MG PO TABS: 100.0000 mg | ORAL_TABLET | Freq: Two times a day (BID) | ORAL | Status: DC

## 2021-06-01 NOTE — Telephone Encounter (Signed)
PT AWARE OF RECOMMENDATIONS AND AGREES WITH PLAN APPT MADE FOR 06/12/21 AT 3:30 PM  ?

## 2021-06-01 NOTE — Telephone Encounter (Signed)
Spoke with pt re message and B/P this am was 152/101  and this was after taking Nifedipine  30 mg around 9 this morning Per pt also was evaluated on Monday with B/P of 161/115 Per pt Nifedipine is not working is needing different med Pt also was asking if could restart Asa 81 mg per pt blood was thick when had blood draw.Pt is still nursing .Will forward to Dr Servando Salina for review and recommendations ./cy ?

## 2021-06-01 NOTE — Telephone Encounter (Signed)
Pt c/o BP issue: STAT if pt c/o blurred vision, one-sided weakness or slurred speech ? ?1. What are your last 5 BP readings? 152/101 this morning 10:30 ? ?2. Are you having any other symptoms (ex. Dizziness, headache, blurred vision, passed out)? headache ? ?3. What is your BP issue? Hypertension ? ?Pt wants to dicuss how the Nifedipine 30mg  is not working for her and wants to be put on an alternative ?

## 2021-06-01 NOTE — Telephone Encounter (Signed)
Pt also asking if okay to resume ASA  81 mg ? ?

## 2021-06-02 DIAGNOSIS — O165 Unspecified maternal hypertension, complicating the puerperium: Secondary | ICD-10-CM | POA: Diagnosis not present

## 2021-06-03 DIAGNOSIS — Z419 Encounter for procedure for purposes other than remedying health state, unspecified: Secondary | ICD-10-CM | POA: Diagnosis not present

## 2021-06-08 DIAGNOSIS — O165 Unspecified maternal hypertension, complicating the puerperium: Secondary | ICD-10-CM | POA: Diagnosis not present

## 2021-06-12 ENCOUNTER — Telehealth: Payer: Self-pay

## 2021-06-12 ENCOUNTER — Ambulatory Visit: Payer: Medicaid Other | Admitting: Cardiology

## 2021-06-12 MED ORDER — LABETALOL HCL 100 MG PO TABS: 200.0000 mg | ORAL_TABLET | Freq: Two times a day (BID) | ORAL | Status: DC

## 2021-06-12 NOTE — Telephone Encounter (Signed)
Pt called in ans said she thought her appointment today was virtual. I let the pt know I sent the information in a message and she states she just ready it today. Pt wanted a virtual appointment tomorrow. She was informed she has to come into the clinic because she did not have her blood pressure readings for her last appointment and Dr. Servando Salina needs to take her blood pressure. Pt scheduled for Wednesday with Dr. Servando Salina. Pt asked about medication dosages. It was reiterated she needs to take Labetalol 200 mg twice daily and Nifedipine 30 mg once daily. Medication list updated.  ?

## 2021-06-14 ENCOUNTER — Ambulatory Visit (INDEPENDENT_AMBULATORY_CARE_PROVIDER_SITE_OTHER): Payer: Medicaid Other | Admitting: Cardiology

## 2021-06-14 ENCOUNTER — Encounter: Payer: Self-pay | Admitting: Cardiology

## 2021-06-14 VITALS — BP 140/102 | HR 73 | Ht 68.0 in | Wt 226.8 lb

## 2021-06-14 DIAGNOSIS — O165 Unspecified maternal hypertension, complicating the puerperium: Secondary | ICD-10-CM

## 2021-06-14 DIAGNOSIS — E669 Obesity, unspecified: Secondary | ICD-10-CM | POA: Diagnosis not present

## 2021-06-14 MED ORDER — FUROSEMIDE 40 MG PO TABS: 40.0000 mg | ORAL_TABLET | ORAL | 3 refills | Status: DC

## 2021-06-14 MED ORDER — NIFEDIPINE ER OSMOTIC RELEASE 60 MG PO TB24: 60.0000 mg | ORAL_TABLET | Freq: Every day | ORAL | 3 refills | Status: DC

## 2021-06-14 NOTE — Patient Instructions (Addendum)
Medication Instructions:  ?Your physician has recommended you make the following change in your medication:  ?START: Lasix 40 mg once a week ?INCREASE: Nifedipine 60 mg once daily  ?*If you need a refill on your cardiac medications before your next appointment, please call your pharmacy* ? ? ?Lab Work: ?None ?If you have labs (blood work) drawn today and your tests are completely normal, you will receive your results only by: ?MyChart Message (if you have MyChart) OR ?A paper copy in the mail ?If you have any lab test that is abnormal or we need to change your treatment, we will call you to review the results. ? ? ?Testing/Procedures: ?None ? ? ?Follow-Up: ?At [facility], you and your health needs are our priority.  As part of our continuing mission to provide you with exceptional heart care, we have created designated Provider Care Teams.  These Care Teams include your primary Cardiologist (physician) and Advanced Practice Providers (APPs -  Physician Assistants and Nurse Practitioners) who all work together to provide you with the care you need, when you need it. ? ?We recommend signing up for the patient portal called "MyChart".  Sign up information is provided on this After Visit Summary.  MyChart is used to connect with patients for Virtual Visits (Telemedicine).  Patients are able to view lab/test results, encounter notes, upcoming appointments, etc.  Non-urgent messages can be sent to your provider as well.   ?To learn more about what you can do with MyChart, go to [email or web address].   ? ?Your next appointment:   ?6 week(s) ? ?The format for your next appointment:   ?Virtual Visit  ? ?Provider:   ?Berniece Salines, DO   ? ? ?Other Instructions ? ? ?Important Information About Sugar ? ? ? ? ?  ?

## 2021-06-14 NOTE — Progress Notes (Signed)
?[facility]io[facility]stetrics Clinic ? ?Follow Up Note ? ? ?Date:  06/16/2021  ? ?ID:  Samantha Alexander, DOB 1990/12/31, MRN [id number] ? ?PCP:  Pcp, No ?  ?[facility] Providers ?Cardiologist:  Thomasene RippleKardie Eldean Nanna, DO  ?Electrophysiologist:  None      ? ? ?Referring MD: No ref. provider found  ? ?Chief Complaint: ' my blood pressure has been high" ? ?History of Present Illness:   ? ?Samantha Alexander is a 31 y.o. female [G2P1103] who returns for follow up of  postpartum hypertension . I saw the patient virtually on May 27, 2021.  At that time she had not taking her blood pressure.  We talked about this.  She did tell me that her blood pressure previously was fine. ? ?In the interim the patient called reporting that she had noted hyper tension. ? ? ?Prior CV Studies Reviewed: ?The following studies were reviewed today: ? ?TTE 03/13/2021 FINDINGS  ? Left Ventricle: Left ventricular ejection fraction, by estimation, is 70  ?to 75%. Left ventricular ejection fraction by 3D volume is 71 %. The left  ?ventricle has hyperdynamic function. The left ventricle has no regional  ?wall motion abnormalities. The  ?left ventricular internal cavity size was normal in size. There is no left  ?ventricular hypertrophy. Left ventricular diastolic parameters were  ?normal.  ? ?Right Ventricle: The right ventricular size is normal. No increase in  ?right ventricular wall thickness. Right ventricular systolic function is  ?normal. There is normal pulmonary artery systolic pressure. The tricuspid  ?regurgitant velocity is 2.08 m/s, and  ? with an assumed right atrial pressure of 3 mmHg, the estimated right  ?ventricular systolic pressure is 20.3 mmHg.  ? ?Left Atrium: Left atrial size was normal in size.  ? ?Right Atrium: Right atrial size was normal in size.  ? ?Pericardium: There is no evidence of pericardial effusion.  ? ?Mitral Valve: The mitral valve is normal in structure. No evidence of  ?mitral valve regurgitation. No evidence of mitral valve  stenosis.  ? ?Tricuspid Valve: The tricuspid valve is normal in structure. Tricuspid  ?valve regurgitation is not demonstrated. No evidence of tricuspid  ?stenosis.  ? ?Aortic Valve: The aortic valve is tricuspid. Aortic valve regurgitation is  ?not visualized. No aortic stenosis is present.  ? ?Pulmonic Valve: The pulmonic valve was not well visualized. Pulmonic valve  ?regurgitation is not visualized. No evidence of pulmonic stenosis.  ? ?Aorta: The aortic root and ascending aorta are structurally normal, with  ?no evidence of dilitation.  ? ?Venous: The inferior vena cava is normal in size with greater than 50%  ?respiratory variability, suggesting right atrial pressure of 3 mmHg.  ? ?IAS/Shunts: No atrial level shunt detected by color flow Doppler.  ?  ?Zio monitor  ?Patch Wear Time:  14 days and 0 hours (2022-12-19T09:25:49-0500 to 2023-01-02T09:25:53-0500) ?  ?Patient had a min HR of 62 bpm, max HR of 152 bpm, and avg HR of 99 bpm. Predominant underlying rhythm was Sinus Rhythm. First Degree AV Block was present. Isolated SVEs were rare (<1.0%), SVE Couplets were rare (<1.0%), and no SVE Triplets were present. ? Isolated VEs were rare (<1.0%), and no VE Couplets or VE Triplets were present. ?  ?Symptoms associated with sinus rhythm and sinus tachycardia. ?  ?Conclusion: Normal/unremarkable study with no evidence of arrhythmia. ? ?Past Medical History:  ?Diagnosis Date  ? Abscess of vulva   ? Anemia   ? Headache   ? HPV (human papilloma virus) anogenital infection   ?  Pregnancy induced hypertension   ? ? ?Past Surgical History:  ?Procedure Laterality Date  ? CESAREAN SECTION MULTI-GESTATIONAL N/A 05/10/2021  ? Procedure: CESAREAN SECTION MULTI-GESTATIONAL;  Surgeon: Osborn Coho, MD;  Location: [facility] LD [facility];  Service: Obstetrics;  Laterality: N/A;  ? hemorrhoid ligation    ? WISDOM TOOTH EXTRACTION    ?   ? ?OB History   ? ? Gravida  ?2  ? Para  ?2  ? Term  ?1  ? Preterm  ?1  ? AB  ?   ? Living  ?3  ?  ? ? SAB  ?    ? IAB  ?   ? Ectopic  ?   ? Multiple  ?1  ? Live Births  ?3  ?   ?  ?  ?    ? ? ?Current Medications: ?Current Meds  ?Medication Sig  ? aspirin 81 MG EC tablet   ? Cholecalciferol (VITAMIN D3) 50 MCG (2000 UT) capsule Take 2,000 Units by mouth daily.  ? furosemide (LASIX) 40 MG tablet Take 1 tablet (40 mg total) by mouth once a week.  ? ibuprofen (ADVIL) 600 MG tablet Take 1 tablet (600 mg total) by mouth every 6 (six) hours.  ? iron polysaccharides (NIFEREX) 150 MG capsule Take 1 capsule (150 mg total) by mouth daily.  ? labetalol (NORMODYNE) 100 MG tablet Take 2 tablets (200 mg total) by mouth 2 (two) times daily.  ? NIFEdipine (PROCARDIA XL/NIFEDICAL XL) 60 MG 24 hr tablet Take 1 tablet (60 mg total) by mouth daily.  ? OVER THE COUNTER MEDICATION daily. Lactation supplements  ? Probiotic Product (PROBIOTIC PO) Take by mouth daily.  ? [DISCONTINUED] NIFEdipine (PROCARDIA-XL/NIFEDICAL-XL) 30 MG 24 hr tablet Take 1 tablet (30 mg total) by mouth daily.  ?  ? ?Allergies:   Fentanyl  ? ?Social History  ? ?Socioeconomic History  ? Marital status: Single  ?  Spouse name: Not on file  ? Number of children: Not on file  ? Years of education: Not on file  ? Highest education level: Not on file  ?Occupational History  ? Not on file  ?Tobacco Use  ? Smoking status: Never  ? Smokeless tobacco: Never  ?Vaping Use  ? Vaping Use: Never used  ?Substance and Sexual Activity  ? Alcohol use: Never  ? Drug use: Never  ? Sexual activity: Not Currently  ?Other Topics Concern  ? Not on file  ?Social History Narrative  ? Not on file  ? ?Social Determinants of Health  ? ?Financial Resource Strain: Not on file  ?Food Insecurity: Not on file  ?Transportation Needs: Not on file  ?Physical Activity: Not on file  ?Stress: Not on file  ?Social Connections: Not on file  ?  ? ? ?Family History  ?Problem Relation Age of Onset  ? Hypertension Mother   ? Diabetes Mother   ? Hypertension Father   ? Diabetes Father   ? Diabetes Sister   ? Lupus  Sister   ?   ? ?ROS:   ?Please see the history of present illness.    ? ?All other systems reviewed and are negative. ? ? ?Labs/EKG Reviewed:   ? ?EKG:   ?EKG is was not ordered today.   ? ?Recent Labs: ?05/29/2021: ALT 24; BUN 9; Creatinine, Ser 0.90; Hemoglobin 10.7; Platelets 339; Potassium 3.7; Sodium 140  ? ?Recent Lipid Panel ?No results found for: CHOL, TRIG, HDL, CHOLHDL, LDLCALC, LDLDIRECT ? ?Physical Exam:   ? ?VS:  BP (!) 140/102   Pulse 73   Ht 5\' 8"  (1.727 m)   Wt 226 lb 12.8 oz (102.9 kg)   SpO2 98%   Breastfeeding Yes   BMI 34.48 kg/m?    ? ?Wt Readings from Last 3 Encounters:  ?06/14/21 226 lb 12.8 oz (102.9 kg)  ?05/29/21 224 lb 12.8 oz (102 kg)  ?05/26/21 232 lb (105.2 kg)  ?  ? ?GEN:  Well nourished, well developed in no acute distress ?HEENT: Normal ?NECK: No JVD; No carotid bruits ?LYMPHATICS: No lymphadenopathy ?CARDIAC: RRR, no murmurs, rubs, gallops ?RESPIRATORY:  Clear to auscultation without rales, wheezing or rhonchi  ?ABDOMEN: Soft, non-tender, non-distended ?MUSCULOSKELETAL:  No edema; No deformity  ?SKIN: Warm and dry ?NEUROLOGIC:  Alert and oriented x 3 ?PSYCHIATRIC:  Normal affect  ? ? ?Risk Assessment/Risk Calculators:   ? ?  ?  ?   ?  ?  ? ? ?ASSESSMENT & PLAN:   ? ?Her blood pressure is elevated in the office today.  We will increase her nifedipine to 60 mg daily.  Continue her labetalol 200 mg twice daily. ?She sent me her blood pressure in the next 2 weeks. ?She also does have leg edema we will give the patient Lasix for once weekly. ? ?The patient understands the need to lose weight with diet and exercise. We have discussed specific strategies for this. ?Patient Instructions  ?Medication Instructions:  ?Your physician has recommended you make the following change in your medication:  ?START: Lasix 40 mg once a week ?INCREASE: Nifedipine 60 mg once daily  ?*If you need a refill on your cardiac medications before your next appointment, please call your pharmacy* ? ? ?Lab  Work: ?None ?If you have labs (blood work) drawn today and your tests are completely normal, you will receive your results only by: ?MyChart Message (if you have MyChart) OR ?A paper copy in the mail ?If you have any la

## 2021-06-15 DIAGNOSIS — I87309 Chronic venous hypertension (idiopathic) without complications of unspecified lower extremity: Secondary | ICD-10-CM | POA: Diagnosis not present

## 2021-06-20 DIAGNOSIS — Z3040 Encounter for surveillance of contraceptives, unspecified: Secondary | ICD-10-CM | POA: Diagnosis not present

## 2021-06-20 DIAGNOSIS — N920 Excessive and frequent menstruation with regular cycle: Secondary | ICD-10-CM | POA: Diagnosis not present

## 2021-06-28 DIAGNOSIS — N920 Excessive and frequent menstruation with regular cycle: Secondary | ICD-10-CM | POA: Diagnosis not present

## 2021-07-03 DIAGNOSIS — Z419 Encounter for procedure for purposes other than remedying health state, unspecified: Secondary | ICD-10-CM | POA: Diagnosis not present

## 2021-08-01 ENCOUNTER — Telehealth: Payer: Self-pay

## 2021-08-01 ENCOUNTER — Telehealth (INDEPENDENT_AMBULATORY_CARE_PROVIDER_SITE_OTHER): Payer: Medicaid Other | Admitting: Cardiology

## 2021-08-01 VITALS — BP 121/75 | HR 80 | Ht 68.0 in | Wt 221.6 lb

## 2021-08-01 DIAGNOSIS — O165 Unspecified maternal hypertension, complicating the puerperium: Secondary | ICD-10-CM

## 2021-08-01 MED ORDER — LABETALOL HCL 100 MG PO TABS: 200.0000 mg | ORAL_TABLET | Freq: Every day | ORAL | 3 refills | Status: DC

## 2021-08-01 NOTE — Patient Instructions (Addendum)
Medication Instructions:  Your physician has recommended you make the following change in your medication:  DECREASE: Labetalol 200 mg once daily *If you need a refill on your cardiac medications before your next appointment, please call your pharmacy*   Lab Work: None If you have labs (blood work) drawn today and your tests are completely normal, you will receive your results only by: MyChart Message (if you have MyChart) OR A paper copy in the mail If you have any lab test that is abnormal or we need to change your treatment, we will call you to review the results.   Testing/Procedures: None   Follow-Up: At [facility], you and your health needs are our priority.  As part of our continuing mission to provide you with exceptional heart care, we have created designated Provider Care Teams.  These Care Teams include your primary Cardiologist (physician) and Advanced Practice Providers (APPs -  Physician Assistants and Nurse Practitioners) who all work together to provide you with the care you need, when you need it.  We recommend signing up for the patient portal called "MyChart".  Sign up information is provided on this After Visit Summary.  MyChart is used to connect with patients for Virtual Visits (Telemedicine).  Patients are able to view lab/test results, encounter notes, upcoming appointments, etc.  Non-urgent messages can be sent to your provider as well.   To learn more about what you can do with MyChart, go to [email or web address].    Your next appointment:   2 month(s)  The format for your next appointment:   In Person  Provider:   Thomasene Ripple, DO     Other Instructions   Important Information About Sugar

## 2021-08-01 NOTE — Progress Notes (Signed)
Cardio-Obstetrics Clinic  Follow Up Note   Date:  08/01/2021   ID:  Samantha Alexander, DOB 01-24-1991, MRN [id number]  PCP:  Pcp, No   [facility] Providers Cardiologist:  Thomasene RippleKardie Tye Vigo, DO  Electrophysiologist:  None        The patient is at home. I am in the office.  Virtual Visit via Video  Note . I connected with the patient today by a   video enabled telemedicine application and verified that I am speaking with the correct person using two identifiers.  Referring MD: No ref. provider found   Chief Complaint: " I am doing fine"  History of Present Illness:    Samantha Alexander is a 31 y.o. female [G2P1103] who returns for follow up of postpartum hypertension.  I saw the patient virtually on May 27, 2021.  At that time she had not taking her blood pressure.  We talked about this.  She did tell me that her blood pressure previously was fine.  I saw the patient on June 14, 2021 at that time her blood pressure was elevated.  Increase her nifedipine to 60 mg daily continue labetalol 200 mg twice a day.  Since I saw the patient she tells me that her blood pressure had dropped and therefore she has cut down on her labetalol 200 mg twice a day.  No other complaints at this time.  Prior CV Studies Reviewed: The following studies were reviewed today:   Past Medical History:  Diagnosis Date   Abscess of vulva    Anemia    Headache    HPV (human papilloma virus) anogenital infection    Pregnancy induced hypertension     Past Surgical History:  Procedure Laterality Date   CESAREAN SECTION MULTI-GESTATIONAL N/A 05/10/2021   Procedure: CESAREAN SECTION MULTI-GESTATIONAL;  Surgeon: Osborn Cohooberts, Angela, MD;  Location: [facility] LD ORS;  Service: Obstetrics;  Laterality: N/A;   hemorrhoid ligation     WISDOM TOOTH EXTRACTION        OB History     Gravida  2   Para  2   Term  1   Preterm  1   AB      Living  3      SAB      IAB      Ectopic      Multiple  1    Live Births  3               Current Medications: Current Meds  Medication Sig   aspirin 81 MG EC tablet    Cholecalciferol (VITAMIN D3) 50 MCG (2000 UT) capsule Take 2,000 Units by mouth daily.   furosemide (LASIX) 40 MG tablet Take 1 tablet (40 mg total) by mouth once a week.   iron polysaccharides (NIFEREX) 150 MG capsule Take 1 capsule (150 mg total) by mouth daily.   labetalol (NORMODYNE) 100 MG tablet Take 2 tablets (200 mg total) by mouth daily.   NIFEdipine (PROCARDIA XL/NIFEDICAL XL) 60 MG 24 hr tablet Take 1 tablet (60 mg total) by mouth daily.   OVER THE COUNTER MEDICATION daily. Lactation supplements   Probiotic Product (PROBIOTIC PO) Take by mouth daily.   [DISCONTINUED] labetalol (NORMODYNE) 100 MG tablet Take 2 tablets (200 mg total) by mouth 2 (two) times daily.     Allergies:   Fentanyl   Social History   Socioeconomic History   Marital status: Single    Spouse name: Not on file   Number of children:  Not on file   Years of education: Not on file   Highest education level: Not on file  Occupational History   Not on file  Tobacco Use   Smoking status: Never   Smokeless tobacco: Never  Vaping Use   Vaping Use: Never used  Substance and Sexual Activity   Alcohol use: Never   Drug use: Never   Sexual activity: Not Currently  Other Topics Concern   Not on file  Social History Narrative   Not on file   Social Determinants of Health   Financial Resource Strain: Not on file  Food Insecurity: Not on file  Transportation Needs: Not on file  Physical Activity: Not on file  Stress: Not on file  Social Connections: Not on file      Family History  Problem Relation Age of Onset   Hypertension Mother    Diabetes Mother    Hypertension Father    Diabetes Father    Diabetes Sister    Lupus Sister       ROS:   Please see the history of present illness.     All other systems reviewed and are negative.   Labs/EKG Reviewed:    EKG:   EKG is was  not ordered today.    Recent Labs: 05/29/2021: ALT 24; BUN 9; Creatinine, Ser 0.90; Hemoglobin 10.7; Platelets 339; Potassium 3.7; Sodium 140   Recent Lipid Panel No results found for: CHOL, TRIG, HDL, CHOLHDL, LDLCALC, LDLDIRECT  Physical Exam:    VS:  BP 121/75   Pulse 80   Ht 5\' 8"  (1.727 m)   Wt 121 lb 9.6 oz (55.2 kg)   BMI 18.49 kg/m     Wt Readings from Last 3 Encounters:  08/01/21 121 lb 9.6 oz (55.2 kg)  06/14/21 226 lb 12.8 oz (102.9 kg)  05/29/21 224 lb 12.8 oz (102 kg)     GEN:  Well nourished, well developed in no acute distress HEENT: Normal NECK: No JVD; No carotid bruits LYMPHATICS: No lymphadenopathy CARDIAC: RRR, no murmurs, rubs, gallops RESPIRATORY:  Clear to auscultation without rales, wheezing or rhonchi  ABDOMEN: Soft, non-tender, non-distended MUSCULOSKELETAL:  No edema; No deformity  SKIN: Warm and dry NEUROLOGIC:  Alert and oriented x 3 PSYCHIATRIC:  Normal affect    Risk Assessment/Risk Calculators:                 ASSESSMENT & PLAN:    Postpartum hypertension -her blood pressures acceptable at this time.  We will keep her on the labetalol 200 mg once daily with the nifedipine 60 mg daily.  She takes Lasix once a week.  Her goal is less than 130/80.  Eventually we will titrate her off the labetalol and see if the nifedipine covers her blood pressure and keep her to target.   Patient Instructions  Medication Instructions:  Your physician has recommended you make the following change in your medication:  DECREASE: Labetalol 200 mg once daily *If you need a refill on your cardiac medications before your next appointment, please call your pharmacy*   Lab Work: None If you have labs (blood work) drawn today and your tests are completely normal, you will receive your results only by: MyChart Message (if you have MyChart) OR A paper copy in the mail If you have any lab test that is abnormal or we need to change your treatment, we will  call you to review the results.   Testing/Procedures: None   Follow-Up: At [facility],  you and your health needs are our priority.  As part of our continuing mission to provide you with exceptional heart care, we have created designated Provider Care Teams.  These Care Teams include your primary Cardiologist (physician) and Advanced Practice Providers (APPs -  Physician Assistants and Nurse Practitioners) who all work together to provide you with the care you need, when you need it.  We recommend signing up for the patient portal called "MyChart".  Sign up information is provided on this After Visit Summary.  MyChart is used to connect with patients for Virtual Visits (Telemedicine).  Patients are able to view lab/test results, encounter notes, upcoming appointments, etc.  Non-urgent messages can be sent to your provider as well.   To learn more about what you can do with MyChart, go to [email or web address].    Your next appointment:   2 month(s)  The format for your next appointment:   In Person  Provider:   Thomasene Ripple, DO     Other Instructions   Important Information About Sugar         Dispo:  Return in about 2 months (around 10/01/2021).   Medication Adjustments/Labs and Tests Ordered: Current medicines are reviewed at length with the patient today.  Concerns regarding medicines are outlined above.  Tests Ordered: No orders of the defined types were placed in this encounter.  Medication Changes: Meds ordered this encounter  Medications   labetalol (NORMODYNE) 100 MG tablet    Sig: Take 2 tablets (200 mg total) by mouth daily.    Dispense:  180 tablet    Refill:  3

## 2021-08-01 NOTE — Telephone Encounter (Signed)
Pt returning call and requesting call back.  

## 2021-08-01 NOTE — Telephone Encounter (Signed)
Called pt to go over her AVS. 2 month appt made as well.

## 2021-08-01 NOTE — Telephone Encounter (Signed)
Called pt to get her ready for her appt. No answer, left a message for her to return the call.  

## 2021-08-03 DIAGNOSIS — Z419 Encounter for procedure for purposes other than remedying health state, unspecified: Secondary | ICD-10-CM | POA: Diagnosis not present

## 2021-08-03 NOTE — Telephone Encounter (Signed)
Pt was contacted and seen. See chart.

## 2021-08-18 ENCOUNTER — Ambulatory Visit: Payer: Medicaid Other | Admitting: Cardiology

## 2021-09-02 DIAGNOSIS — Z419 Encounter for procedure for purposes other than remedying health state, unspecified: Secondary | ICD-10-CM | POA: Diagnosis not present

## 2021-10-02 ENCOUNTER — Ambulatory Visit: Payer: Medicaid Other | Admitting: Cardiology

## 2021-10-03 DIAGNOSIS — Z419 Encounter for procedure for purposes other than remedying health state, unspecified: Secondary | ICD-10-CM | POA: Diagnosis not present

## 2021-10-11 ENCOUNTER — Encounter: Payer: Self-pay | Admitting: Cardiology

## 2021-10-11 ENCOUNTER — Ambulatory Visit (INDEPENDENT_AMBULATORY_CARE_PROVIDER_SITE_OTHER): Payer: Medicaid Other | Admitting: Cardiology

## 2021-10-11 VITALS — BP 130/70 | HR 88 | Ht 68.0 in | Wt 221.4 lb

## 2021-10-11 DIAGNOSIS — O165 Unspecified maternal hypertension, complicating the puerperium: Secondary | ICD-10-CM | POA: Diagnosis not present

## 2021-10-11 DIAGNOSIS — E669 Obesity, unspecified: Secondary | ICD-10-CM

## 2021-10-11 NOTE — Progress Notes (Signed)
Cardio-Obstetrics Clinic  Follow Up Note   Date:  10/11/2021   ID:  Samantha Alexander, DOB [birth year], MRN [id number]  PCP:  Pcp, No   [facility] Providers Cardiologist:  Thomasene Ripple, DO  Electrophysiologist:  None        Referring MD: No ref. provider found   Chief Complaint: " I am   History of Present Illness:    Samantha Alexander is a 31 y.o. 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  I saw the patient virtually on May 27, 2021.  At that time she had not taking her blood pressure.  We talked about this.  She did tell me that her blood pressure previously was fine.   I saw the patient on June 14, 2021 at that time her blood pressure was elevated.  Increase her nifedipine to 60 mg daily continue labetalol 200 mg twice a day.  Since I saw the patient she tells me that her blood pressure had dropped and therefore she has cut down on her labetalol 200 mg twice a day.   I saw the patient on May 30 ,2023 at that time no changes were made with her medication.  Since I saw the patient she has been doing well.  She has had some external stressors but she is working through it.  Prior CV Studies Reviewed: The following studies were reviewed today:  Zio monitor 03/24/2021 Patch Wear Time:  14 days and 0 hours (2022-12-19T09:25:49-0500 to 2023-01-02T09:25:53-0500)   Patient had a min HR of 62 bpm, max HR of 152 bpm, and avg HR of 99 bpm. Predominant underlying rhythm was Sinus Rhythm. First Degree AV Block was present. Isolated SVEs were rare (<1.0%), SVE Couplets were rare (<1.0%), and no SVE Triplets were present.  Isolated VEs were rare (<1.0%), and no VE Couplets or VE Triplets were present.   Symptoms associated with sinus rhythm and sinus tachycardia.   Conclusion: Normal/unremarkable study with no evidence of arrhythmia.  TTE 03/13/2021 FINDINGS   Left Ventricle: Left ventricular ejection fraction, by estimation, is 70  to 75%. Left  ventricular ejection fraction by 3D volume is 71 %. The left  ventricle has hyperdynamic function. The left ventricle has no regional  wall motion abnormalities. The  left ventricular internal cavity size was normal in size. There is no left  ventricular hypertrophy. Left ventricular diastolic parameters were  normal.   Right Ventricle: The right ventricular size is normal. No increase in  right ventricular wall thickness. Right ventricular systolic function is  normal. There is normal pulmonary artery systolic pressure. The tricuspid  regurgitant velocity is 2.08 m/s, and   with an assumed right atrial pressure of 3 mmHg, the estimated right  ventricular systolic pressure is 20.3 mmHg.   Left Atrium: Left atrial size was normal in size.   Right Atrium: Right atrial size was normal in size.   Pericardium: There is no evidence of pericardial effusion.   Mitral Valve: The mitral valve is normal in structure. No evidence of mitral valve regurgitation. No evidence of mitral valve stenosis.   Tricuspid Valve: The tricuspid valve is normal in structure. Tricuspid valve regurgitation is not demonstrated. No evidence of tricuspid stenosis.   Aortic Valve: The aortic valve is tricuspid. Aortic valve regurgitation is not visualized. No aortic stenosis is present.   Pulmonic Valve: The pulmonic valve was not well visualized. Pulmonic valve regurgitation is not visualized. No evidence of pulmonic stenosis.   Aorta: The aortic root and  ascending aorta are structurally normal, with no evidence of dilitation.   Venous: The inferior vena cava is normal in size with greater than 50% respiratory variability, suggesting right atrial pressure of 3 mmHg.   IAS/Shunts: No atrial level shunt detected by color flow Doppler.       Past Medical History:  Diagnosis Date   Abscess of vulva    Anemia    Headache    HPV (human papilloma virus) anogenital infection    Pregnancy induced hypertension      Past Surgical History:  Procedure Laterality Date   CESAREAN SECTION MULTI-GESTATIONAL N/A 05/10/2021   Procedure: CESAREAN SECTION MULTI-GESTATIONAL;  Surgeon: Osborn Coho, MD;  Location: [facility] LD ORS;  Service: Obstetrics;  Laterality: N/A;   hemorrhoid ligation     WISDOM TOOTH EXTRACTION        OB History     Gravida  2   Para  2   Term  1   Preterm  1   AB      Living  3      SAB      IAB      Ectopic      Multiple  1   Live Births  3               Current Medications: Current Meds  Medication Sig   aspirin 81 MG EC tablet    Cholecalciferol (VITAMIN D3) 50 MCG (2000 UT) capsule Take 2,000 Units by mouth daily.   furosemide (LASIX) 40 MG tablet Take 1 tablet (40 mg total) by mouth once a week.   ibuprofen (ADVIL) 600 MG tablet Take 1 tablet (600 mg total) by mouth every 6 (six) hours. (Patient taking differently: Take 600 mg by mouth every 6 (six) hours as needed for mild pain.)   iron polysaccharides (NIFEREX) 150 MG capsule Take 1 capsule (150 mg total) by mouth daily.   labetalol (NORMODYNE) 100 MG tablet Take 2 tablets (200 mg total) by mouth daily.   NIFEdipine (PROCARDIA XL/NIFEDICAL XL) 60 MG 24 hr tablet Take 1 tablet (60 mg total) by mouth daily.   OVER THE COUNTER MEDICATION daily. Lactation supplements   Probiotic Product (PROBIOTIC PO) Take by mouth daily.     Allergies:   Fentanyl   Social History   Socioeconomic History   Marital status: Single    Spouse name: Not on file   Number of children: Not on file   Years of education: Not on file   Highest education level: Not on file  Occupational History   Not on file  Tobacco Use   Smoking status: Never   Smokeless tobacco: Never  Vaping Use   Vaping Use: Never used  Substance and Sexual Activity   Alcohol use: Never   Drug use: Never   Sexual activity: Not Currently  Other Topics Concern   Not on file  Social History Narrative   Not on file   Social Determinants of  Health   Financial Resource Strain: Not on file  Food Insecurity: Not on file  Transportation Needs: Not on file  Physical Activity: Not on file  Stress: Not on file  Social Connections: Not on file      Family History  Problem Relation Age of Onset   Hypertension Mother    Diabetes Mother    Hypertension Father    Diabetes Father    Diabetes Sister    Lupus Sister       ROS:  Please see the history of present illness.     All other systems reviewed and are negative.   Labs/EKG Reviewed:    EKG:   EKG is was not ordered today.   Recent Labs: 05/29/2021: ALT 24; BUN 9; Creatinine, Ser 0.90; Hemoglobin 10.7; Platelets 339; Potassium 3.7; Sodium 140   Recent Lipid Panel No results found for: "CHOL", "TRIG", "HDL", "CHOLHDL", "LDLCALC", "LDLDIRECT"  Physical Exam:    VS:  BP 130/70   Pulse 88   Ht 5\' 8"  (1.727 m)   Wt 221 lb 6.4 oz (100.4 kg)   LMP 10/10/2021   SpO2 99%   Breastfeeding Yes   BMI 33.66 kg/m     Wt Readings from Last 3 Encounters:  10/11/21 221 lb 6.4 oz (100.4 kg)  08/01/21 221 lb 9.6 oz (100.5 kg)  06/14/21 226 lb 12.8 oz (102.9 kg)     GEN:  Well nourished, well developed in no acute distress HEENT: Normal NECK: No JVD; No carotid bruits LYMPHATICS: No lymphadenopathy CARDIAC: RRR, no murmurs, rubs, gallops RESPIRATORY:  Clear to auscultation without rales, wheezing or rhonchi  ABDOMEN: Soft, non-tender, non-distended MUSCULOSKELETAL:  No edema; No deformity  SKIN: Warm and dry NEUROLOGIC:  Alert and oriented x 3 PSYCHIATRIC:  Normal affect    Risk Assessment/Risk Calculators:                 ASSESSMENT & PLAN:    Postpartum hypertension Obesity  Blood pressure is within normal.  Will keep her on her current labetalol 200 mg twice daily as well as nifedipine 60 mg daily.  She will take Lasix once daily.  She also noticed that if she does not take her Lasix she does have significant leg swelling. I like to keep the  patient on this regimen because she is not sure yet if she is going to have another baby, which if she is going to have another baby this would be the best regimen for her.  The patient understands the need to lose weight with diet and exercise. We have discussed specific strategies for this. Patient Instructions  Medication Instructions:  Your physician recommends that you continue on your current medications as directed. Please refer to the Current Medication list given to you today.  *If you need a refill on your cardiac medications before your next appointment, please call your pharmacy*   Lab Work: None If you have labs (blood work) drawn today and your tests are completely normal, you will receive your results only by: MyChart Message (if you have MyChart) OR A paper copy in the mail If you have any lab test that is abnormal or we need to change your treatment, we will call you to review the results.   Testing/Procedures: None   Follow-Up: At [facility], you and your health needs are our priority.  As part of our continuing mission to provide you with exceptional heart care, we have created designated Provider Care Teams.  These Care Teams include your primary Cardiologist (physician) and Advanced Practice Providers (APPs -  Physician Assistants and Nurse Practitioners) who all work together to provide you with the care you need, when you need it.  We recommend signing up for the patient portal called "MyChart".  Sign up information is provided on this After Visit Summary.  MyChart is used to connect with patients for Virtual Visits (Telemedicine).  Patients are able to view lab/test results, encounter notes, upcoming appointments, etc.  Non-urgent messages can be sent to  your provider as well.   To learn more about what you can do with MyChart, go to [email or web address].    Your next appointment:   6 month(s)  The format for your next appointment:   In  Person  Provider:   Thomasene Ripple, DO     Other Instructions   Important Information About Sugar         Dispo:  Return in about 6 months (around 04/13/2022).   Medication Adjustments/Labs and Tests Ordered: Current medicines are reviewed at length with the patient today.  Concerns regarding medicines are outlined above.  Tests Ordered: No orders of the defined types were placed in this encounter.  Medication Changes: No orders of the defined types were placed in this encounter.

## 2021-10-11 NOTE — Patient Instructions (Signed)

## 2021-11-01 ENCOUNTER — Other Ambulatory Visit: Payer: Self-pay

## 2021-11-01 ENCOUNTER — Encounter (HOSPITAL_COMMUNITY): Payer: Self-pay

## 2021-11-01 ENCOUNTER — Emergency Department (HOSPITAL_COMMUNITY)
Admission: EM | Admit: 2021-11-01 | Discharge: 2021-11-01 | Disposition: A | Discharge status: Home / Self Care | Payer: Medicaid Other | Attending: Emergency Medicine | Admitting: Emergency Medicine

## 2021-11-01 DIAGNOSIS — K625 Hemorrhage of anus and rectum: Secondary | ICD-10-CM | POA: Diagnosis not present

## 2021-11-01 DIAGNOSIS — Z7982 Long term (current) use of aspirin: Secondary | ICD-10-CM | POA: Insufficient documentation

## 2021-11-01 LAB — POC OCCULT BLOOD, ED: Fecal Occult Bld: POSITIVE — AB

## 2021-11-01 MED ORDER — POLYETHYLENE GLYCOL 3350 17 GM/SCOOP PO POWD
1.0000 | Freq: Once | ORAL | 0 refills | Status: AC
Start: 2021-11-01 — End: 2021-11-01

## 2021-11-01 NOTE — ED Triage Notes (Signed)
Pt arrived POV from home stating that she has hemorrhoids that started bleeding yesterday. Pt states it is not constantly bleeding it comes and goes. Pt c/o discomfort.

## 2021-11-01 NOTE — ED Provider Notes (Signed)
[facility] EMERGENCY DEPARTMENT Provider Note   CSN: [id number] Arrival date & time: 11/01/21  0715     History  Chief Complaint  Patient presents with   Hemorrhoids    HPI Samantha Alexander is a 31 y.o. female with history of hemorrhoids status post banding a few years ago presenting for concern for hemorrhoids.  Reports that bleeding started yesterday with bowel movement.  It was bright red and "filled the toilet bowl". Bleeding was all of a sudden and has been persistent with every bowel movement since which has prompted her to be evaluated in the ED. Also mentioned recent history of C-section in March of this year patient. Denies SOB, dizziness, and lightheadness.     Home Medications Prior to Admission medications   Medication Sig Start Date End Date Taking? Authorizing Provider  polyethylene glycol powder (GLYCOLAX/MIRALAX) 17 GM/SCOOP powder Take 255 g by mouth once for 1 dose. 11/01/21 11/01/21 Yes Gareth Eagle, PA-C  aspirin 81 MG EC tablet  05/29/21   [provider]  Cholecalciferol (VITAMIN D3) 50 MCG (2000 UT) capsule Take 2,000 Units by mouth daily. 04/18/21   [provider]  furosemide (LASIX) 40 MG tablet Take 1 tablet (40 mg total) by mouth once a week. 06/14/21 06/14/22  Tobb, Kardie, DO  ibuprofen (ADVIL) 600 MG tablet Take 1 tablet (600 mg total) by mouth every 6 (six) hours. Patient taking differently: Take 600 mg by mouth every 6 (six) hours as needed for mild pain. 05/13/21   Dale Theodosia, FNP  iron polysaccharides (NIFEREX) 150 MG capsule Take 1 capsule (150 mg total) by mouth daily. 02/02/21   Johney Maine, MD  labetalol (NORMODYNE) 100 MG tablet Take 2 tablets (200 mg total) by mouth daily. 08/01/21   Tobb, Kardie, DO  NIFEdipine (PROCARDIA XL/NIFEDICAL XL) 60 MG 24 hr tablet Take 1 tablet (60 mg total) by mouth daily. 06/14/21   Tobb, Kardie, DO  OVER THE COUNTER MEDICATION daily. Lactation supplements    [provider]  Probiotic Product (PROBIOTIC PO) Take by mouth daily.    [provider]      Allergies    Fentanyl    Review of Systems   Review of Systems  Gastrointestinal:  Positive for anal bleeding.    Physical Exam Updated Vital Signs BP 137/84 (BP Location: Right Arm)   Pulse 63   Temp 99.2 F (37.3 C) (Oral)   Resp 15   Ht 5\' 8"  (1.727 m)   Wt 100.5 kg   LMP 10/10/2021   SpO2 100%   BMI 33.69 kg/m  Physical Exam Constitutional:      Appearance: Normal appearance.  HENT:     Head: Normocephalic.     Nose: Nose normal.  Eyes:     Conjunctiva/sclera: Conjunctivae normal.  Pulmonary:     Effort: Pulmonary effort is normal.  Genitourinary:    Comments: Rectal exam: no bleeding, no fissure, no evidence of external hemorrhoid.  Internal exam revealed tenderness to palpation of two firm nodules. No active bleeding in rectal vault.  Neurological:     Mental Status: She is alert.  Psychiatric:        Mood and Affect: Mood normal.     ED Results / Procedures / Treatments   Labs (all labs ordered are listed, but only abnormal results are displayed) Labs Reviewed  POC OCCULT BLOOD, ED - Abnormal; Notable for the following components:      Result Value  Fecal Occult Bld POSITIVE (*)    All other components within normal limits    EKG None  Radiology No results found.  Procedures Procedures    Medications Ordered in ED Medications - No data to display  ED Course/ Medical Decision Making/ A&P                           Medical Decision Making  Patient presented for concerns for hemorrhoids. Rectal exam did not reveal external hemorrhoids, evidence of fissure or bleeding. Upon internal inspection palpation of 2 firm nodules was noted.  Patient endorsed pain on palpation. Likely suggestive of internal hemorrhoid.  Was Hemoccult positive.  No active bleeding in rectal vault. Did consider acute GI bleeding but patient denies shortness of breath  lightheadedness does not look pale on exam.  Recommended conservative treatment for internal hemorrhoid.  Follow-up with PCP.  Discussed return precautions.        Final Clinical Impression(s) / ED Diagnoses Final diagnoses:  Rectal bleeding    Rx / DC Orders ED Discharge Orders          Ordered    polyethylene glycol powder (GLYCOLAX/MIRALAX) 17 GM/SCOOP powder   Once        11/01/21 0914              Gareth Eagle, PA-C 11/01/21 3810    Virgina Norfolk, DO 11/01/21 [phone]

## 2021-11-01 NOTE — Discharge Instructions (Addendum)
Diagnosed with rectal bleeding today.  Physical exam of the rectum was concerning for possible internal hemorrhoid which is likely causing bleeding and pain during bowel movement.  Recommend conservative treatment at this time: stool softener (I ordered miralax for you), sitz batch, good hydration, and fiber intake.  If you have new shortness of breath, more pale, worsening bleeding, lightheadedness please return to the emergency department for further evaluation.   GETTING TO GOOD BOWEL HEALTH. Irregular bowel habits such as constipation and diarrhea can lead to many problems over time.  Having one soft bowel movement a day is the most important way to prevent further problems.  The anorectal canal is designed to handle stretching and feces to safely manage our ability to get rid of solid waste (feces, poop, stool) out of our body.  BUT, hard constipated stools can act like ripping concrete bricks and diarrhea can be a burning fire to this very sensitive area of our body, causing inflamed hemorrhoids, anal fissures, increasing risk is perirectal abscesses, abdominal pain/bloating, an making irritable bowel worse.     The goal: ONE SOFT BOWEL MOVEMENT A DAY!  To have soft, regular bowel movements:  Drink at least 8 tall glasses of water a day.   Take plenty of fiber.  Fiber is the undigested part of plant food that passes into the colon, acting s "natures broom" to encourage bowel motility and movement.  Fiber can absorb and hold large amounts of water. This results in a larger, bulkier stool, which is soft and easier to pass. Work gradually over several weeks up to 6 servings a day of fiber (25g a day even more if needed) in the form of: Vegetables -- Root (potatoes, carrots, turnips), leafy green (lettuce, salad greens, celery, spinach), or cooked high residue (cabbage, broccoli, etc) Fruit -- Fresh (unpeeled skin & pulp), Dried (prunes, apricots, cherries, etc ),  or stewed ( applesauce)  Whole grain  breads, pasta, etc (whole wheat)  Bran cereals  Bulking Agents -- This type of water-retaining fiber generally is easily obtained each day by one of the following:  Psyllium bran -- The psyllium plant is remarkable because its ground seeds can retain so much water. This product is available as Metamucil, Konsyl, Effersyllium, Per Diem Fiber, or the less expensive generic preparation in drug and health food stores. Although labeled a laxative, it really is not a laxative.  Methylcellulose -- This is another fiber derived from wood which also retains water. It is available as Citrucel. Polyethylene Glycol - and "artificial" fiber commonly called Miralax or Glycolax.  It is helpful for people with gassy or bloated feelings with regular fiber Flax Seed - a less gassy fiber than psyllium No reading or other relaxing activity while on the toilet. If bowel movements take longer than 5 minutes, you are too constipated AVOID CONSTIPATION.  High fiber and water intake usually takes care of this.  Sometimes a laxative is needed to stimulate more frequent bowel movements, but  Laxatives are not a good long-term solution as it can wear the colon out. Osmotics (Milk of Magnesia, Fleets phosphosoda, Magnesium citrate, MiraLax, GoLytely) are safer than  Stimulants (Senokot, Castor Oil, Dulcolax, Ex Lax)    Do not take laxatives for more than 7days in a row.  IF SEVERELY CONSTIPATED, try a Bowel Retraining Program: Do not use laxatives.  Eat a diet high in roughage, such as bran cereals and leafy vegetables.  Drink six (6) ounces of prune or apricot juice each morning.  Eat two (2) large servings of stewed fruit each day.  Take one (1) heaping tablespoon of a psyllium-based bulking agent twice a day. Use sugar-free sweetener when possible to avoid excessive calories.  Eat a normal breakfast.  Set aside 15 minutes after breakfast to sit on the toilet, but do not strain to have a bowel movement.  If you do not  have a bowel movement by the third day, use an enema and repeat the above steps.  Controlling diarrhea Switch to liquids and simpler foods for a few days to avoid stressing your intestines further. Avoid dairy products (especially milk & ice cream) for a short time.  The intestines often can lose the ability to digest lactose when stressed. Avoid foods that cause gassiness or bloating.  Typical foods include beans and other legumes, cabbage, broccoli, and dairy foods.  Every person has some sensitivity to other foods, so listen to our body and avoid those foods that trigger problems for you. Adding fiber (Citrucel, Metamucil, psyllium, Miralax) gradually can help thicken stools by absorbing excess fluid and retrain the intestines to act more normally.  Slowly increase the dose over a few weeks.  Too much fiber too soon can backfire and cause cramping & bloating. Probiotics (such as active yogurt, Align, etc) may help repopulate the intestines and colon with normal bacteria and calm down a sensitive digestive tract.  Most studies show it to be of mild help, though, and such products can be costly. Medicines: Bismuth subsalicylate (ex. Kayopectate, Pepto Bismol) every 30 minutes for up to 6 doses can help control diarrhea.  Avoid if pregnant. Loperamide (Immodium) can slow down diarrhea.  Start with two tablets (4mg  total) first and then try one tablet every 6 hours.  Avoid if you are having fevers or severe pain.  If you are not better or start feeling worse, stop all medicines and call your doctor for advice Call your doctor if you are getting worse or not better.  Sometimes further testing (cultures, endoscopy, X-ray studies, bloodwork, etc) may be needed to help diagnose and treat the cause of the diarrhea.  Managing Pain  Pain after surgery or related to activity is often due to strain/injury to muscle, tendon, nerves and/or incisions.  This pain is usually short-term and will improve in a few  months.   Many people find it helpful to do the following things TOGETHER to help speed the process of healing and to get back to regular activity more quickly:  Avoid heavy physical activity  no lifting greater than 20 pounds Do not "push through" the pain.  Listen to your body and avoid positions and maneuvers than reproduce the pain Walking is okay as tolerated, but go slowly and stop when getting sore.  Remember: If it hurts to do it, then don't do it! Take Anti-inflammatory medication  Take with food/snack around the clock for 1-2 weeks This helps the muscle and nerve tissues become less irritable and calm down faster Choose ONE of the following over-the-counter medications: Naproxen 220mg  tabs (ex. Aleve) 1-2 pills twice a day  Ibuprofen 200mg  tabs (ex. Advil, Motrin) 3-4 pills with every meal and just before bedtime Acetaminophen 500mg  tabs (Tylenol) 1-2 pills with every meal and just before bedtime Use a Heating pad or Ice/Cold Pack 4-6 times a day May use warm bath/hottub  or showers Try Gentle Massage and/or Stretching  at the area of pain many times a day stop if you feel pain - do not overdo it  Try these steps together to help you body heal faster and avoid making things get worse.  Doing just one of these things may not be enough.    If you are not getting better after two weeks or are noticing you are getting worse, contact our office for further advice; we may need to re-evaluate you & see what other things we can do to help.

## 2021-11-03 DIAGNOSIS — Z419 Encounter for procedure for purposes other than remedying health state, unspecified: Secondary | ICD-10-CM | POA: Diagnosis not present

## 2021-11-07 ENCOUNTER — Encounter: Payer: Self-pay | Admitting: Cardiology

## 2021-11-08 ENCOUNTER — Ambulatory Visit: Payer: Medicaid Other | Attending: Cardiology | Admitting: Cardiology

## 2021-11-08 ENCOUNTER — Encounter: Payer: Self-pay | Admitting: Cardiology

## 2021-11-08 VITALS — BP 138/90 | HR 80 | Ht 68.0 in | Wt 223.6 lb

## 2021-11-08 DIAGNOSIS — Z7689 Persons encountering health services in other specified circumstances: Secondary | ICD-10-CM | POA: Diagnosis not present

## 2021-11-08 DIAGNOSIS — K625 Hemorrhage of anus and rectum: Secondary | ICD-10-CM

## 2021-11-08 NOTE — Patient Instructions (Signed)
Medication Instructions:  Your physician recommends that you continue on your current medications as directed. Please refer to the Current Medication list given to you today.  *If you need a refill on your cardiac medications before your next appointment, please call your pharmacy*   Lab Work: None   Testing/Procedures: None   Follow-Up: At [facility], you and your health needs are our priority.  As part of our continuing mission to provide you with exceptional heart care, we have created designated Provider Care Teams.  These Care Teams include your primary Cardiologist (physician) and Advanced Practice Providers (APPs -  Physician Assistants and Nurse Practitioners) who all work together to provide you with the care you need, when you need it.  We recommend signing up for the patient portal called "MyChart".  Sign up information is provided on this After Visit Summary.  MyChart is used to connect with patients for Virtual Visits (Telemedicine).  Patients are able to view lab/test results, encounter notes, upcoming appointments, etc.  Non-urgent messages can be sent to your provider as well.   To learn more about what you can do with MyChart, go to [email or web address].    Your next appointment:   9 month(s)  The format for your next appointment:   In Person  Provider:   Thomasene Ripple, DO

## 2021-11-08 NOTE — Progress Notes (Signed)
Cardio-Obstetrics Clinic  Follow Up Note   Date:  11/08/2021   ID:  Samantha Alexander, DOB 26-Nov-1990, MRN [id number]  PCP:  Aviva Kluver   [facility] Providers Cardiologist:  Thomasene Ripple, DO  Electrophysiologist:  None        Referring MD: No ref. provider found   Chief Complaint: " I am ok"  History of Present Illness:    Samantha Alexander is a 31 y.o. female [G2P1103] who returns for follow up from a ED visit.  I saw the patient on October 11, 2021.  During that visit her blood pressure was normal.  We continued her medication.  With no changes.  Since I saw the patient she had an ED visit where she was experiencing rectal bleeding.  She was asked to follow-up.  She does have paperwork for work restriction due to the abnormal bleeding. She denies any chest pain or shortness of breath.   Prior CV Studies Reviewed: The following studies were reviewed today:    Zio monitor 03/24/2021 Patch Wear Time:  14 days and 0 hours (2022-12-19T09:25:49-0500 to 2023-01-02T09:25:53-0500)   Patient had a min HR of 62 bpm, max HR of 152 bpm, and avg HR of 99 bpm. Predominant underlying rhythm was Sinus Rhythm. First Degree AV Block was present. Isolated SVEs were rare (<1.0%), SVE Couplets were rare (<1.0%), and no SVE Triplets were present.  Isolated VEs were rare (<1.0%), and no VE Couplets or VE Triplets were present.   Symptoms associated with sinus rhythm and sinus tachycardia.   Conclusion: Normal/unremarkable study with no evidence of arrhythmia.   TTE 03/13/2021 FINDINGS   Left Ventricle: Left ventricular ejection fraction, by estimation, is 70  to 75%. Left ventricular ejection fraction by 3D volume is 71 %. The left  ventricle has hyperdynamic function. The left ventricle has no regional  wall motion abnormalities. The  left ventricular internal cavity size was normal in size. There is no left  ventricular hypertrophy. Left ventricular diastolic parameters were  normal.    Right Ventricle: The right ventricular size is normal. No increase in  right ventricular wall thickness. Right ventricular systolic function is  normal. There is normal pulmonary artery systolic pressure. The tricuspid  regurgitant velocity is 2.08 m/s, and   with an assumed right atrial pressure of 3 mmHg, the estimated right  ventricular systolic pressure is 20.3 mmHg.   Left Atrium: Left atrial size was normal in size.   Right Atrium: Right atrial size was normal in size.   Pericardium: There is no evidence of pericardial effusion.   Mitral Valve: The mitral valve is normal in structure. No evidence of mitral valve regurgitation. No evidence of mitral valve stenosis.   Tricuspid Valve: The tricuspid valve is normal in structure. Tricuspid valve regurgitation is not demonstrated. No evidence of tricuspid stenosis.   Aortic Valve: The aortic valve is tricuspid. Aortic valve regurgitation is not visualized. No aortic stenosis is present.   Pulmonic Valve: The pulmonic valve was not well visualized. Pulmonic valve regurgitation is not visualized. No evidence of pulmonic stenosis.   Aorta: The aortic root and ascending aorta are structurally normal, with no evidence of dilitation.   Venous: The inferior vena cava is normal in size with greater than 50% respiratory variability, suggesting right atrial pressure of 3 mmHg.   IAS/Shunts: No atrial level shunt detected by color flow Doppler.   Past Medical History:  Diagnosis Date   Abscess of vulva    Anemia  Headache    HPV (human papilloma virus) anogenital infection    Pregnancy induced hypertension     Past Surgical History:  Procedure Laterality Date   CESAREAN SECTION MULTI-GESTATIONAL N/A 05/10/2021   Procedure: CESAREAN SECTION MULTI-GESTATIONAL;  Surgeon: Osborn Coho, MD;  Location: [facility] LD [facility]S;  Service: Obstetrics;  Laterality: N/A;   hemorrhoid ligation     WISDOM TOOTH EXTRACTION        OB History      Gravida  2   Para  2   Term  1   Preterm  1   AB      Living  3      SAB      IAB      Ectopic      Multiple  1   Live Births  3               Current Medications: Current Meds  Medication Sig   Cholecalciferol (VITAMIN D3) 50 MCG (2000 UT) capsule Take 2,000 Units by mouth daily.   furosemide (LASIX) 40 MG tablet Take 1 tablet (40 mg total) by mouth once a week.   ibuprofen (ADVIL) 600 MG tablet Take 1 tablet (600 mg total) by mouth every 6 (six) hours. (Patient taking differently: Take 600 mg by mouth every 6 (six) hours as needed for mild pain.)   iron polysaccharides (NIFEREX) 150 MG capsule Take 1 capsule (150 mg total) by mouth daily.   labetalol (NORMODYNE) 100 MG tablet Take 2 tablets (200 mg total) by mouth daily.   NIFEdipine (PROCARDIA XL/NIFEDICAL XL) 60 MG 24 hr tablet Take 1 tablet (60 mg total) by mouth daily.   OVER THE COUNTER MEDICATION daily. Lactation supplements   Probiotic Product (PROBIOTIC PO) Take by mouth daily.     Allergies:   Fentanyl   Social History   Socioeconomic History   Marital status: Single    Spouse name: Not on file   Number of children: Not on file   Years of education: Not on file   Highest education level: Not on file  Occupational History   Not on file  Tobacco Use   Smoking status: Never   Smokeless tobacco: Never  Vaping Use   Vaping Use: Never used  Substance and Sexual Activity   Alcohol use: Never   Drug use: Never   Sexual activity: Not Currently  Other Topics Concern   Not on file  Social History Narrative   Not on file   Social Determinants of Health   Financial Resource Strain: Not on file  Food Insecurity: Not on file  Transportation Needs: Not on file  Physical Activity: Not on file  Stress: Not on file  Social Connections: Not on file      Family History  Problem Relation Age of Onset   Hypertension Mother    Diabetes Mother    Hypertension Father    Diabetes Father     Diabetes Sister    Lupus Sister       ROS:   Please see the history of present illness.     All other systems reviewed and are negative.   Labs/EKG Reviewed:    EKG:   EKG is was not ordered today.    Recent Labs: 05/29/2021: ALT 24; BUN 9; Creatinine, Ser 0.90; Hemoglobin 10.7; Platelets 339; Potassium 3.7; Sodium 140   Recent Lipid Panel No results found for: "CHOL", "TRIG", "HDL", "CHOLHDL", "LDLCALC", "LDLDIRECT"  Physical Exam:    VS:  BP (!) 138/90   Pulse 80   Ht 5\' 8"  (1.727 m)   Wt 223 lb 9.6 oz (101.4 kg)   LMP 10/10/2021   SpO2 100%   BMI 34.00 kg/m     Wt Readings from Last 3 Encounters:  11/08/21 223 lb 9.6 oz (101.4 kg)  11/01/21 221 lb 9.6 oz (100.5 kg)  10/11/21 221 lb 6.4 oz (100.4 kg)     GEN:  Well nourished, well developed in no acute distress HEENT: Normal NECK: No JVD; No carotid bruits LYMPHATICS: No lymphadenopathy CARDIAC: RRR, no murmurs, rubs, gallops RESPIRATORY:  Clear to auscultation without rales, wheezing or rhonchi  ABDOMEN: Soft, non-tender, non-distended MUSCULOSKELETAL:  No edema; No deformity  SKIN: Warm and dry NEUROLOGIC:  Alert and oriented x 3 PSYCHIATRIC:  Normal affect    Risk Assessment/Risk Calculators:                 ASSESSMENT & PLAN:    Rectal bleeding-she was seen in the ED for this.  And needs to really see GI.  Going to refer her to GI.  She also needs a PCP refer her to our primary care group.  I also educated patient to help in scheduling as well.  She has a paperwork to be answered for work restrictions relating to her hemorrhoids and anal bleeding unfortunately this is also out of my scope of practice I will defer this to GI as well as primary care.  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 Obesity-the patient understands the need to lose weight with diet and exercise. We have discussed specific strategies for this.  There are no Patient Instructions on file for this  visit.   Dispo:  No follow-ups on file.   Medication Adjustments/Labs and Tests Ordered: Current medicines are reviewed at length with the patient today.  Concerns regarding medicines are outlined above.  Tests Ordered: No orders of the defined types were placed in this encounter.  Medication Changes: No orders of the defined types were placed in this encounter.

## 2021-11-10 ENCOUNTER — Encounter: Payer: Self-pay | Admitting: Gastroenterology

## 2021-11-10 ENCOUNTER — Ambulatory Visit: Payer: Self-pay | Admitting: Gastroenterology

## 2021-11-10 VITALS — BP 124/88 | HR 68 | Ht 68.0 in | Wt 220.0 lb

## 2021-11-10 DIAGNOSIS — Z0279 Encounter for issue of other medical certificate: Secondary | ICD-10-CM

## 2021-11-10 DIAGNOSIS — K625 Hemorrhage of anus and rectum: Secondary | ICD-10-CM

## 2021-11-10 DIAGNOSIS — Z800 Family history of malignant neoplasm of digestive organs: Secondary | ICD-10-CM

## 2021-11-10 MED ORDER — HYDROCORTISONE ACETATE 25 MG RE SUPP: 25.0000 mg | Freq: Every evening | RECTAL | 1 refills | Status: DC | PRN

## 2021-11-10 MED ORDER — NA SULFATE-K SULFATE-MG SULF 17.5-3.13-1.6 GM/177ML PO SOLN: 1.0000 | Freq: Once | ORAL | 0 refills | Status: AC

## 2021-11-10 NOTE — Progress Notes (Addendum)
11/10/2021 Samantha Alexander [id number] November 17, 1990   HISTORY OF PRESENT ILLNESS: This is a 31 year old female who is new to our office.  She has been referred here by Dr. Servando Salina, with cardiology for evaluation of rectal bleeding.  The patient tells me that last week she had a large amount of rectal bleeding.  She says that it started Tuesday morning and continued into Thursday evening before it stopped.  Then she did continue to still see some bright red blood on the toilet paper upon wiping.  She tells me that she has seen some bright red blood in the past, but usually is with hard stool and this time she did not have any hard stool.  It was quite a large amount of blood.  Described some discomfort in her bottom as well.  She had a colonoscopy with Dr. Noe Gens with [facility] in November 2017 at which time she was found to have only internal hemorrhoids.  She says that following that she ended up having a hemorrhoid banding.  Looks like she also had an EGD in November 2017 at which time she was found to have gastritis and esophagitis.  I do not have any biopsy results.  She tells me that she has been anemic forever.  Follows with hematology.  Is on Niferex daily.  Has family history in her father who was diagnosed in his 32s as well as a paternal grandmother and a paternal uncle.  Past Medical History:  Diagnosis Date   Abscess of vulva    Anemia    Headache    HPV (human papilloma virus) anogenital infection    Pregnancy induced hypertension    Past Surgical History:  Procedure Laterality Date   CESAREAN SECTION MULTI-GESTATIONAL N/A 05/10/2021   Procedure: CESAREAN SECTION MULTI-GESTATIONAL;  Surgeon: Osborn Coho, MD;  Location: [facility] LD ORS;  Service: Obstetrics;  Laterality: N/A;   hemorrhoid ligation     WISDOM TOOTH EXTRACTION      reports that she has never smoked. She has never used smokeless tobacco. She reports that she does not drink alcohol and does not use  drugs. family history includes Breast cancer in her maternal aunt; Colon cancer in her father, paternal grandmother, and paternal uncle; Colon polyps in her mother; Diabetes in her father, mother, and sister; Diverticulitis in her brother; Esophageal cancer in her maternal uncle; Heart disease in her maternal grandfather; Heart failure in her brother; Hypertension in her father and mother; Lupus in her sister; Ovarian cancer in her maternal aunt; Prostate cancer in her paternal uncle. Allergies  Allergen Reactions   Fentanyl Itching      Outpatient Encounter Medications as of 11/10/2021  Medication Sig   aspirin 81 MG EC tablet    Cholecalciferol (VITAMIN D3) 50 MCG (2000 UT) capsule Take 2,000 Units by mouth daily.   furosemide (LASIX) 40 MG tablet Take 1 tablet (40 mg total) by mouth once a week.   ibuprofen (ADVIL) 600 MG tablet Take 1 tablet (600 mg total) by mouth every 6 (six) hours. (Patient taking differently: Take 600 mg by mouth every 6 (six) hours as needed for mild pain.)   iron polysaccharides (NIFEREX) 150 MG capsule Take 1 capsule (150 mg total) by mouth daily.   labetalol (NORMODYNE) 100 MG tablet Take 2 tablets (200 mg total) by mouth daily.   NIFEdipine (PROCARDIA XL/NIFEDICAL XL) 60 MG 24 hr tablet Take 1 tablet (60 mg total) by mouth daily.   OVER THE COUNTER MEDICATION  daily. Lactation supplements   Probiotic Product (PROBIOTIC PO) Take by mouth daily.   No facility-administered encounter medications on file as of 11/10/2021.     REVIEW OF SYSTEMS  : All other systems reviewed and negative except where noted in the History of Present Illness.   PHYSICAL EXAM: BP 124/88   Pulse 68   Ht 5\' 8"  (1.727 m)   Wt 220 lb (99.8 kg)   LMP 10/10/2021   SpO2 98%   BMI 33.45 kg/m  General: Well developed female in no acute distress Head: Normocephalic and atraumatic Eyes:  Sclerae anicteric, conjunctiva pink. Ears: Normal auditory acuity Lungs: Clear throughout to  auscultation; no W/R/R. Heart: Regular rate and rhythm; no M/R/G. Abdomen: Soft, non-distended.  BS present.  Non-tender. Rectal: No external abnormalities noted.  DRE without any masses.  Anoscopy was performed and did not show any large internal hemorrhoids, specifically nothing that look like it had been recently bleeding. Musculoskeletal: Symmetrical with no gross deformities  Skin: No lesions on visible extremities Extremities: No edema  Neurological: Alert oriented x 4, grossly non-focal Psychological:  Alert and cooperative. Normal mood and affect  ASSESSMENT AND PLAN: *Rectal bleeding: Has history of internal hemorrhoids as seen on previous colonoscopy in 2017.  Recently had a large episode of rectal bleeding.  Continued to see blood even earlier this week.  I did not see any large hemorrhoids on exam today, particularly nothing that looked like it had been recently bleeding.  Because of this we will go ahead and proceed with repeat colonoscopy.  This is being scheduled with Dr. 2018.  The risks, benefits, and alternatives to colonoscopy were discussed with the patient and she consents to proceed.  We will send a prescription for hydrocortisone suppositories to use at bedtime as needed for now.  Prescription sent to pharmacy. *Longstanding iron deficiency anemia: Says that she has been anemic since she was very young.  Follows with hematology. *Family history of colon cancer in her father diagnosed in his 29s as well as a paternal grandmother and paternal uncle   CC:  Tobb, Kardie, DO

## 2021-11-10 NOTE — Patient Instructions (Addendum)
If you are age 30 or older, your body mass index should be between 23-30. Your Body mass index is 33.45 kg/m. If this is out of the aforementioned range listed, please consider follow up with your Primary Care Provider.  If you are age 26 or younger, your body mass index should be between 19-25. Your Body mass index is 33.45 kg/m. If this is out of the aformentioned range listed, please consider follow up with your Primary Care Provider.   ________________________________________________________  The [facility] providers would like to encourage you to use [facility] to communicate with providers for non-urgent requests or questions.  Due to long hold times on the telephone, sending your provider a message by [facility] may be a faster and more efficient way to get a response.  Please allow 48 business hours for a response.  Please remember that this is for non-urgent requests.  _______________________________________________________   Bonita Quin have been scheduled for a colonoscopy. Please follow written instructions given to you at your visit today.  Please pick up your prep supplies at the pharmacy within the next 1-3 days. If you use inhalers (even only as needed), please bring them with you on the day of your procedure.   We have sent the following medications to your pharmacy for you to pick up at your convenience: Anusol   Due to recent changes in healthcare laws, you may see the results of your imaging and laboratory studies on MyChart before your provider has had a chance to review them.  We understand that in some cases there may be results that are confusing or concerning to you. Not all laboratory results come back in the same time frame and the provider may be waiting for multiple results in order to interpret others.  Please give [name] 48 hours in order for your provider to thoroughly review all the results before contacting the office for clarification of your results.    It was a pleasure to see  you today!  Thank you for trusting me with your gastrointestinal care!

## 2021-11-11 NOTE — Progress Notes (Signed)
Attending Physician's Attestation   I have reviewed the chart.   I agree with the Advanced Practitioner's note, impression, and recommendations with any updates as below.    Billie Intriago Mansouraty, MD [facility] Advanced Endoscopy Office # [phone]  

## 2021-11-14 ENCOUNTER — Ambulatory Visit (AMBULATORY_SURGERY_CENTER): Payer: Medicaid Other | Admitting: Gastroenterology

## 2021-11-14 ENCOUNTER — Encounter: Payer: Self-pay | Admitting: Gastroenterology

## 2021-11-14 VITALS — BP 131/87 | HR 73 | Temp 98.0°F | Resp 12 | Ht 68.0 in | Wt 220.0 lb

## 2021-11-14 DIAGNOSIS — K640 First degree hemorrhoids: Secondary | ICD-10-CM | POA: Diagnosis not present

## 2021-11-14 DIAGNOSIS — K625 Hemorrhage of anus and rectum: Secondary | ICD-10-CM

## 2021-11-14 DIAGNOSIS — K5730 Diverticulosis of large intestine without perforation or abscess without bleeding: Secondary | ICD-10-CM

## 2021-11-14 DIAGNOSIS — K921 Melena: Secondary | ICD-10-CM

## 2021-11-14 MED ORDER — SODIUM CHLORIDE 0.9 % IV SOLN: 500.0000 mL | Freq: Once | INTRAVENOUS | Status: DC

## 2021-11-14 NOTE — Op Note (Signed)
[place] Patient Name: Samantha Alexander Procedure Date: 11/14/2021 2:47 PM MRN: [id number] Endoscopist: Justice Britain , MD Age: 31 Referring MD:  Date of Birth: Aug 12, 1990 Gender: Female Account #: [id number] Procedure:                Colonoscopy Indications:              Hematochezia Medicines:                Monitored Anesthesia Care Procedure:                Pre-Anesthesia Assessment:                           - Prior to the procedure, a History and Physical                            was performed, and patient medications and                            allergies were reviewed. The patient's tolerance of                            previous anesthesia was also reviewed. The risks                            and benefits of the procedure and the sedation                            options and risks were discussed with the patient.                            All questions were answered, and informed consent                            was obtained. Prior Anticoagulants: The patient has                            taken no previous anticoagulant or antiplatelet                            agents except for aspirin. ASA Grade Assessment: II                            - A patient with mild systemic disease. After                            reviewing the risks and benefits, the patient was                            deemed in satisfactory condition to undergo the                            procedure.  After obtaining informed consent, the colonoscope                            was passed under direct vision. Throughout the                            procedure, the patient's blood pressure, pulse, and                            oxygen saturations were monitored continuously. The                            CF [id number] #[id number] was introduced through the anus                            and advanced to the 5 cm into the ileum. The                             colonoscopy was performed without difficulty. The                            patient tolerated the procedure. The quality of the                            bowel preparation was good. The terminal ileum,                            ileocecal valve, appendiceal orifice, and rectum                            were photographed. Scope In: 3:08:05 PM Scope Out: 3:16:32 PM Scope Withdrawal Time: 0 hours 6 minutes 17 seconds  Total Procedure Duration: 0 hours 8 minutes 27 seconds  Findings:                 The digital rectal exam was normal. Pertinent                            negatives include no palpable rectal lesions.                           The terminal ileum and ileocecal valve appeared                            normal.                           A single medium-mouthed diverticulum was found in                            the ascending colon.                           Normal mucosa was found in the recto-sigmoid colon,  in the sigmoid colon, in the descending colon, at                            the splenic flexure, in the transverse colon, at                            the hepatic flexure and in the cecum.                           Mild patchy granularity was found in the rectum.                            Biopsies were taken with a cold forceps for                            histology.                           Non-bleeding non-thrombosed internal hemorrhoids                            were found during retroflexion, during perianal                            exam and during digital exam. The hemorrhoids were                            Grade I (internal hemorrhoids that do not prolapse). Complications:            No immediate complications. Estimated Blood Loss:     Estimated blood loss was minimal. Impression:               - The examined portion of the ileum was normal.                           - Diverticulosis in the ascending colon.                            - Normal mucosa in the recto-sigmoid colon, in the                            sigmoid colon, in the descending colon, at the                            splenic flexure, in the transverse colon, at the                            hepatic flexure and in the cecum.                           - Mild granularity in the rectum. Biopsied.                           - Non-bleeding non-thrombosed internal hemorrhoids. Recommendation:           -  The patient will be observed post-procedure,                            until all discharge criteria are met.                           - Discharge patient to home.                           - Patient has a contact number available for                            emergencies. The signs and symptoms of potential                            delayed complications were discussed with the                            patient. Return to normal activities tomorrow.                            Written discharge instructions were provided to the                            patient.                           - High fiber diet.                           - Use FiberCon 1-2 tablets PO daily.                           - Continue present medications.                           - Await pathology results.                           - Repeat colonoscopy at age 13 for screening                            purposes due to family history. Unless there is                            evidence of chronic proctitis that is found.                           - The findings and recommendations were discussed                            with the patient.                           - The findings and recommendations were discussed  with the designated responsible adult. Justice Britain, MD 11/14/2021 3:24:27 PM

## 2021-11-14 NOTE — Progress Notes (Signed)
Pt's states no medical or surgical changes since previsit or office visit. 

## 2021-11-14 NOTE — Patient Instructions (Signed)
- The patient will be observed post-procedure, until all discharge criteria are met. - Discharge patient to home. - Patient has a contact number available for emergencies. The signs and symptoms of potential delayed complications were discussed with the patient. Return to normal activities tomorrow. Written discharge instructions were provided to the patient. - High fiber diet. - Use FiberCon 1-2 tablets PO daily. - Continue present medications. - Await pathology results. - Repeat colonoscopy at age 88 for screening purposes due to family history. Unless there is evidence of chronic proctitis that is found.  YOU HAD AN ENDOSCOPIC PROCEDURE TODAY AT [facility]:   Refer to the procedure report that was given to you for any specific questions about what was found during the examination.  If the procedure report does not answer your questions, please call your gastroenterologist to clarify.  If you requested that your care partner not be given the details of your procedure findings, then the procedure report has been included in a sealed envelope for you to review at your convenience later.  YOU SHOULD EXPECT: Some feelings of bloating in the abdomen. Passage of more gas than usual.  Walking can help get rid of the air that was put into your GI tract during the procedure and reduce the bloating. If you had a lower endoscopy (such as a colonoscopy or flexible sigmoidoscopy) you may notice spotting of blood in your stool or on the toilet paper. If you underwent a bowel prep for your procedure, you may not have a normal bowel movement for a few days.  Please Note:  You might notice some irritation and congestion in your nose or some drainage.  This is from the oxygen used during your procedure.  There is no need for concern and it should clear up in a day or so.  SYMPTOMS TO REPORT IMMEDIATELY:  Following lower endoscopy (colonoscopy or flexible sigmoidoscopy):  Excessive amounts of  blood in the stool  Significant tenderness or worsening of abdominal pains  Swelling of the abdomen that is new, acute  Fever of 100F or higher  For urgent or emergent issues, a gastroenterologist can be reached at any hour by calling [phone]. Do not use MyChart messaging for urgent concerns.    DIET:  We do recommend a small meal at first, but then you may proceed to your regular diet.  Drink plenty of fluids but you should avoid alcoholic beverages for 24 hours.  ACTIVITY:  You should plan to take it easy for the rest of today and you should NOT DRIVE or use heavy machinery until tomorrow (because of the sedation medicines used during the test).    FOLLOW UP: Our staff will call the number listed on your records the next business day following your procedure.  We will call around 7:15- 8:00 am to check on you and address any questions or concerns that you may have regarding the information given to you following your procedure. If we do not reach you, we will leave a message.     If any biopsies were taken you will be contacted by phone or by letter within the next 1-3 weeks.  Please call us at [phone] if you have not heard about the biopsies in 3 weeks.    SIGNATURES/CONFIDENTIALITY: You and/or your care partner have signed paperwork which will be entered into your electronic medical record.  These signatures attest to the fact that that the information above on your After Visit Summary has  been reviewed and is understood.  Full responsibility of the confidentiality of this discharge information lies with you and/or your care-partner.

## 2021-11-14 NOTE — Progress Notes (Unsigned)
GASTROENTEROLOGY PROCEDURE H&P NOTE   Primary Care Physician: Pcp, No  HPI: Samantha Alexander is a 31 y.o. female who presents for Colonoscopy for evaluation of rectal bleeding.  Past Medical History:  Diagnosis Date   Abscess of vulva    Anemia    Headache    HPV (human papilloma virus) anogenital infection    Pregnancy induced hypertension    Past Surgical History:  Procedure Laterality Date   CESAREAN SECTION MULTI-GESTATIONAL N/A 05/10/2021   Procedure: CESAREAN SECTION MULTI-GESTATIONAL;  Surgeon: Osborn Coho, MD;  Location: [facility];  Service: Obstetrics;  Laterality: N/A;   hemorrhoid ligation     WISDOM TOOTH EXTRACTION     Current Outpatient Medications  Medication Sig Dispense Refill   aspirin 81 MG EC tablet      Cholecalciferol (VITAMIN D3) 50 MCG (2000 UT) capsule Take 2,000 Units by mouth daily.     furosemide (LASIX) 40 MG tablet Take 1 tablet (40 mg total) by mouth once a week. 13 tablet 3   hydrocortisone (ANUSOL-HC) 25 MG suppository Place 1 suppository (25 mg total) rectally at bedtime as needed for hemorrhoids or anal itching. 14 suppository 1   ibuprofen (ADVIL) 600 MG tablet Take 1 tablet (600 mg total) by mouth every 6 (six) hours. (Patient taking differently: Take 600 mg by mouth every 6 (six) hours as needed for mild pain.) 30 tablet 0   iron polysaccharides (NIFEREX) 150 MG capsule Take 1 capsule (150 mg total) by mouth daily. 30 capsule 5   labetalol (NORMODYNE) 100 MG tablet Take 2 tablets (200 mg total) by mouth daily. 180 tablet 3   NIFEdipine (PROCARDIA XL/NIFEDICAL XL) 60 MG 24 hr tablet Take 1 tablet (60 mg total) by mouth daily. 90 tablet 3   OVER THE COUNTER MEDICATION daily. Lactation supplements     Probiotic Product (PROBIOTIC PO) Take by mouth daily.     No current facility-administered medications for this visit.    Current Outpatient Medications:    aspirin 81 MG EC tablet, , Disp: , Rfl:    Cholecalciferol (VITAMIN D3) 50 MCG  (2000 UT) capsule, Take 2,000 Units by mouth daily., Disp: , Rfl:    furosemide (LASIX) 40 MG tablet, Take 1 tablet (40 mg total) by mouth once a week., Disp: 13 tablet, Rfl: 3   hydrocortisone (ANUSOL-HC) 25 MG suppository, Place 1 suppository (25 mg total) rectally at bedtime as needed for hemorrhoids or anal itching., Disp: 14 suppository, Rfl: 1   ibuprofen (ADVIL) 600 MG tablet, Take 1 tablet (600 mg total) by mouth every 6 (six) hours. (Patient taking differently: Take 600 mg by mouth every 6 (six) hours as needed for mild pain.), Disp: 30 tablet, Rfl: 0   iron polysaccharides (NIFEREX) 150 MG capsule, Take 1 capsule (150 mg total) by mouth daily., Disp: 30 capsule, Rfl: 5   labetalol (NORMODYNE) 100 MG tablet, Take 2 tablets (200 mg total) by mouth daily., Disp: 180 tablet, Rfl: 3   NIFEdipine (PROCARDIA XL/NIFEDICAL XL) 60 MG 24 hr tablet, Take 1 tablet (60 mg total) by mouth daily., Disp: 90 tablet, Rfl: 3   OVER THE COUNTER MEDICATION, daily. Lactation supplements, Disp: , Rfl:    Probiotic Product (PROBIOTIC PO), Take by mouth daily., Disp: , Rfl:  Allergies  Allergen Reactions   Fentanyl Itching   Family History  Problem Relation Age of Onset   Hypertension Mother    Diabetes Mother    Colon polyps Mother    Hypertension Father  Diabetes Father    Colon cancer Father    Diabetes Sister    Lupus Sister    Diverticulitis Brother        died durning surgery   Heart failure Brother    Heart disease Maternal Grandfather    Colon cancer Paternal Grandmother    Colon cancer Paternal Uncle    Prostate cancer Paternal Uncle    Breast cancer Maternal Aunt    Ovarian cancer Maternal Aunt    Esophageal cancer Maternal Uncle        smoker   Social History   Socioeconomic History   Marital status: Single    Spouse name: Not on file   Number of children: Not on file   Years of education: Not on file   Highest education level: Not on file  Occupational History   Not on file   Tobacco Use   Smoking status: Never   Smokeless tobacco: Never  Vaping Use   Vaping Use: Never used  Substance and Sexual Activity   Alcohol use: Never   Drug use: Never   Sexual activity: Not Currently  Other Topics Concern   Not on file  Social History Narrative   Not on file   Social Determinants of Health   Financial Resource Strain: Not on file  Food Insecurity: Not on file  Transportation Needs: Not on file  Physical Activity: Not on file  Stress: Not on file  Social Connections: Not on file  Intimate Partner Violence: Not on file    Physical Exam: There were no vitals filed for this visit. There is no height or weight on file to calculate BMI. GEN: NAD EYE: Sclerae anicteric ENT: MMM CV: Non-tachycardic GI: Soft, NT/ND NEURO:  Alert & Oriented x 3  Lab Results: No results for input(s): "WBC", "HGB", "HCT", "PLT" in the last 72 hours. BMET No results for input(s): "NA", "K", "CL", "CO2", "GLUCOSE", "BUN", "CREATININE", "CALCIUM" in the last 72 hours. LFT No results for input(s): "PROT", "ALBUMIN", "AST", "ALT", "ALKPHOS", "BILITOT", "BILIDIR", "IBILI" in the last 72 hours. PT/INR No results for input(s): "LABPROT", "INR" in the last 72 hours.   Impression / Plan: This is a 31 y.o.female who presents for Colonoscopy for evaluation of rectal bleeding.  The risks and benefits of endoscopic evaluation/treatment were discussed with the patient and/or family; these include but are not limited to the risk of perforation, infection, bleeding, missed lesions, lack of diagnosis, severe illness requiring hospitalization, as well as anesthesia and sedation related illnesses.  The patient's history has been reviewed, patient examined, no change in status, and deemed stable for procedure.  The patient and/or family is agreeable to proceed.    Corliss Parish, MD  Gastroenterology Advanced Endoscopy Office # [phone]

## 2021-11-14 NOTE — Progress Notes (Signed)
Called to room to assist during endoscopic procedure.  Patient ID and intended procedure confirmed with present staff. Received instructions for my participation in the procedure from the performing physician.  

## 2021-11-14 NOTE — Progress Notes (Unsigned)
Sedate, gd SR, tolerated procedure well, VSS, report to RN 

## 2021-11-15 ENCOUNTER — Telehealth: Payer: Self-pay | Admitting: *Deleted

## 2021-11-15 ENCOUNTER — Telehealth: Payer: Self-pay | Admitting: Gastroenterology

## 2021-11-15 NOTE — Telephone Encounter (Signed)
Patient called regarding a FMLA paper work ,please call patient once is ready to be pick up.

## 2021-11-15 NOTE — Telephone Encounter (Signed)
Thank you :)

## 2021-11-15 NOTE — Telephone Encounter (Signed)
  Follow up Call-     11/14/2021    2:45 PM  Call back number  Post procedure Call Back phone  # [phone]  Permission to leave phone message Yes     Patient questions:  Do you have a fever, pain , or abdominal swelling? No. Pain Score  0 *  Have you tolerated food without any problems? Yes.    Have you been able to return to your normal activities? Yes.    Do you have any questions about your discharge instructions: Diet   No. Medications  No. Follow up visit  No.  Do you have questions or concerns about your Care? No.  Actions: * If pain score is 4 or above: No action needed, pain <4.

## 2021-11-15 NOTE — Telephone Encounter (Signed)
Has anyone seen any paper work for this pt? I am unaware of any FMLA forms that needed to be completed.

## 2021-11-17 ENCOUNTER — Encounter: Payer: Self-pay | Admitting: Gastroenterology

## 2021-12-03 DIAGNOSIS — Z419 Encounter for procedure for purposes other than remedying health state, unspecified: Secondary | ICD-10-CM | POA: Diagnosis not present

## 2021-12-14 ENCOUNTER — Ambulatory Visit: Payer: Medicaid Other | Admitting: Family

## 2021-12-25 ENCOUNTER — Telehealth: Payer: Self-pay | Admitting: Family

## 2021-12-25 NOTE — Telephone Encounter (Signed)
Called patient and lvm advising that her insurance is still showing Dr. Hubbard Robinson and Archer as her providers when we run her insurance and this must be changed in order for [name] to see her. Advised for her to call back so we can get her rescheduled.

## 2021-12-26 ENCOUNTER — Ambulatory Visit: Payer: Medicaid Other | Admitting: Family

## 2022-01-02 ENCOUNTER — Ambulatory Visit (HOSPITAL_COMMUNITY)
Admission: EM | Admit: 2022-01-02 | Discharge: 2022-01-02 | Disposition: A | Discharge status: Home / Self Care | Payer: Medicaid Other | Attending: Emergency Medicine | Admitting: Emergency Medicine

## 2022-01-02 ENCOUNTER — Encounter (HOSPITAL_COMMUNITY): Payer: Self-pay | Admitting: Emergency Medicine

## 2022-01-02 DIAGNOSIS — U071 COVID-19: Secondary | ICD-10-CM | POA: Insufficient documentation

## 2022-01-02 DIAGNOSIS — B349 Viral infection, unspecified: Secondary | ICD-10-CM

## 2022-01-02 LAB — POC INFLUENZA A AND B ANTIGEN (URGENT CARE ONLY)
INFLUENZA A ANTIGEN, POC: NEGATIVE
INFLUENZA B ANTIGEN, POC: NEGATIVE

## 2022-01-02 MED ORDER — ALBUTEROL SULFATE HFA 108 (90 BASE) MCG/ACT IN AERS: 2.0000 | INHALATION_SPRAY | Freq: Four times a day (QID) | RESPIRATORY_TRACT | 0 refills | Status: DC | PRN

## 2022-01-02 MED ORDER — FLUTICASONE PROPIONATE 50 MCG/ACT NA SUSP: 2.0000 | Freq: Every day | NASAL | 2 refills | Status: DC

## 2022-01-02 MED ORDER — BENZONATATE 100 MG PO CAPS: 100.0000 mg | ORAL_CAPSULE | Freq: Three times a day (TID) | ORAL | 0 refills | Status: DC

## 2022-01-02 NOTE — ED Triage Notes (Signed)
Pt reports fever, chills, body aches, nasal congestion, chest congestion and a sore throat. States symptoms began with a sore throat yesterday. Requesting flu and covid testing. Has tried mucinex and alka seltzer max for relief.

## 2022-01-02 NOTE — Discharge Instructions (Addendum)
You will receive a call if your COVID test results are positive.   We are treating you for viral illness today.  Please make sure to stay hydrated drinking at least 8 cups of water daily.    You may rotate Tylenol and ibuprofen every 4-6 hours.  For example, take Tylenol now and then 4 to 6 hours later take ibuprofen.  You can use Tylenol for fever and moderate pain, you can take this medication every 4-6 hours, please do not take more than 3000 mg in a 24-hour day.  You can take ibuprofen every 6 hours, do not take more than 2400 mg in a 24-hour day.  I advised that you do not take ibuprofen on an empty stomach, ibuprofen can cause GI problems such as GI bleeding.  Tessalon has been sent to the pharmacy, you can take this every 8 hours as needed for cough.  Flonase has been sent to the pharmacy, you can use this 1 time daily with 2 sprays in each nostril during administration.

## 2022-01-02 NOTE — ED Provider Notes (Signed)
[facility]    CSN: [id number] Arrival date & time: 01/02/22  0844      History   Chief Complaint Chief Complaint  Patient presents with   Generalized Body Aches   Fever   Chills   Sore Throat   Nasal Congestion   chest congestion    HPI Samantha Alexander is a 31 y.o. female.  Patient complaining of chest congestion, generalized body aches, chest congestion, and sore throat that started yesterday.  Patient reports nasal congestion that started 1 week ago that has not changed.  Patient reports night sweats.  Patient denies any known exposure to any viral illness.  Patient has taken Mucinex and Alka-Seltzer with no relief of symptoms.  History of asthma with no use of an inhaler.    Fever Associated symptoms: chills, congestion, cough (Nonproductive), ear pain (Bilateral ear pressure), rhinorrhea and sore throat   Associated symptoms: no chest pain   Sore Throat Associated symptoms include shortness of breath (1 episode this morning). Pertinent negatives include no chest pain.    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   Discordant fetal growth in twin gestation 05/10/2021   Breech presentation 05/10/2021   Postpartum care following cesarean delivery 05/10/2021   Obesity in pregnancy 04/27/2021   Chronic hypertension in pregnancy 02/15/2021   History of pre-eclampsia 02/15/2021    Past Surgical History:  Procedure Laterality Date   CESAREAN SECTION MULTI-GESTATIONAL N/A 05/10/2021   Procedure: CESAREAN SECTION  MULTI-GESTATIONAL;  Surgeon: Osborn Coho, MD;  Location: [facility];  Service: Obstetrics;  Laterality: N/A;   hemorrhoid ligation     WISDOM TOOTH EXTRACTION      OB History     Gravida  2   Para  2   Term  1   Preterm  1   AB      Living  3      SAB      IAB      Ectopic      Multiple  1   Live Births  3            Home Medications    Prior to Admission medications   Medication Sig Start Date End Date Taking? Authorizing Provider  albuterol (VENTOLIN HFA) 108 (90 Base) MCG/ACT inhaler Inhale 2 puffs into the lungs every 6 (six) hours as needed for wheezing or shortness of breath. 01/02/22  Yes Debby Freiberg, NP  benzonatate (TESSALON) 100 MG capsule Take 1 capsule (100 mg total) by mouth every 8 (eight) hours. 01/02/22  Yes Debby Freiberg, NP  fluticasone (FLONASE) 50 MCG/ACT nasal spray Place 2 sprays into both nostrils daily. 01/02/22  Yes Debby Freiberg, NP  aspirin 81 MG EC tablet  05/29/21   [provider]  Cholecalciferol (VITAMIN D3) 50 MCG (2000 UT) capsule Take 2,000 Units by mouth daily. 04/18/21   [provider]  furosemide (LASIX) 40 MG tablet Take 1 tablet (40 mg total) by mouth once a week. 06/14/21 06/14/22  Tobb,  Godfrey Pick, DO  hydrocortisone (ANUSOL-HC) 25 MG suppository Place 1 suppository (25 mg total) rectally at bedtime as needed for hemorrhoids or anal itching. 11/10/21   Zehr, Laban Emperor, PA-C  ibuprofen (ADVIL) 600 MG tablet Take 1 tablet (600 mg total) by mouth every 6 (six) hours. Patient taking differently: Take 600 mg by mouth every 6 (six) hours as needed for mild pain. 05/13/21   Noralyn Pick, FNP  iron polysaccharides (NIFEREX) 150 MG capsule Take 1 capsule (150 mg total) by mouth daily. 02/02/21   Brunetta Genera, MD  labetalol (NORMODYNE) 100 MG tablet Take 2 tablets (200 mg total) by mouth daily. 08/01/21   Tobb, Kardie, DO  NIFEdipine (PROCARDIA XL/NIFEDICAL XL) 60 MG 24 hr tablet Take 1 tablet  (60 mg total) by mouth daily. 06/14/21   Tobb, Kardie, DO  OVER THE COUNTER MEDICATION daily. Lactation supplements    [provider]  Probiotic Product (PROBIOTIC PO) Take by mouth daily.    [provider]    Family History Family History  Problem Relation Age of Onset   Hypertension Mother    Diabetes Mother    Colon polyps Mother    Hypertension Father    Diabetes Father    Colon cancer Father    Diabetes Sister    Lupus Sister    Diverticulitis Brother        died 41 surgery   Heart failure Brother    Heart disease Maternal Grandfather    Colon cancer Paternal Grandmother    Colon cancer Paternal Uncle    Prostate cancer Paternal Uncle    Breast cancer Maternal Aunt    Ovarian cancer Maternal Aunt    Esophageal cancer Maternal Uncle        smoker    Social History Social History   Tobacco Use   Smoking status: Never   Smokeless tobacco: Never  Vaping Use   Vaping Use: Never used  Substance Use Topics   Alcohol use: Never   Drug use: Never     Allergies   Fentanyl   Review of Systems Review of Systems  Constitutional:  Positive for activity change, chills, fatigue and fever.  HENT:  Positive for congestion, ear pain (Bilateral ear pressure), postnasal drip, rhinorrhea, sinus pressure and sore throat. Negative for hearing loss, sinus pain, sneezing, tinnitus, trouble swallowing and voice change.   Respiratory:  Positive for cough (Nonproductive) and shortness of breath (1 episode this morning).   Cardiovascular:  Negative for chest pain and palpitations.  Gastrointestinal: Negative.      Physical Exam Triage Vital Signs ED Triage Vitals [01/02/22 1007]  Enc Vitals Group     BP 134/85     Pulse Rate 97     Resp 18     Temp 99.5 F (37.5 C)     Temp Source Oral     SpO2 99 %     Weight      Height      Head Circumference      Peak Flow      Pain Score 7     Pain Loc      Pain Edu?      Excl. in [place]?    No data  found.  Updated Vital Signs BP 134/85 (BP Location: Left Arm)   Pulse 97   Temp 99.5 F (37.5 C) (Oral)   Resp 18   SpO2 99%     Physical Exam Vitals and nursing note reviewed.  HENT:  Right Ear: Hearing, tympanic membrane, ear canal and external ear normal.     Left Ear: Hearing, tympanic membrane, ear canal and external ear normal.     Nose: Congestion and rhinorrhea present. No nasal tenderness. Rhinorrhea is clear.     Right Turbinates: Not enlarged, swollen or pale.     Left Turbinates: Not enlarged, swollen or pale.     Right Sinus: Maxillary sinus tenderness present. No frontal sinus tenderness.     Left Sinus: Maxillary sinus tenderness present. No frontal sinus tenderness.     Mouth/Throat:     Mouth: Mucous membranes are moist.     Pharynx: Uvula midline. Posterior oropharyngeal erythema present. No pharyngeal swelling, oropharyngeal exudate or uvula swelling.     Tonsils: No tonsillar exudate or tonsillar abscesses. 0 on the right. 0 on the left.  Cardiovascular:     Rate and Rhythm: Normal rate and regular rhythm.     Heart sounds: Normal heart sounds, S1 normal and S2 normal.  Pulmonary:     Effort: Pulmonary effort is normal.     Breath sounds: Examination of the right-lower field reveals decreased breath sounds. Examination of the left-lower field reveals decreased breath sounds. Decreased breath sounds present. No wheezing, rhonchi or rales.  Lymphadenopathy:     Head:     Right side of head: No submandibular or tonsillar adenopathy.     Left side of head: No submandibular or tonsillar adenopathy.     Cervical: No cervical adenopathy.     Right cervical: No posterior cervical adenopathy.    Left cervical: No posterior cervical adenopathy.     Upper Body:     Right upper body: No supraclavicular adenopathy.     Left upper body: No supraclavicular adenopathy.  Neurological:     Mental Status: She is alert.  Psychiatric:        Behavior: Behavior is  cooperative.      UC Treatments / Results  Labs (all labs ordered are listed, but only abnormal results are displayed) Labs Reviewed  SARS CORONAVIRUS 2 (TAT 6-24 HRS)  POC INFLUENZA A AND B ANTIGEN (URGENT CARE ONLY)    EKG   Radiology No results found.  Procedures Procedures (including critical care time)  Medications Ordered in UC Medications - No data to display  Initial Impression / Assessment and Plan / UC Course  I have reviewed the triage vital signs and the nursing notes.  Pertinent labs & imaging results that were available during my care of the patient were reviewed by me and considered in my medical decision making (see chart for details).     Patient was evaluated for viral illness.  Flu test was performed in office was negative.  COVID test is pending.  Flonase and Tessalon prescription sent to the pharmacy for symptom management.  Albuterol sent to pharmacy due to patient's asthma history and episode of shortness of breath.  Patient made aware of symptom management of viral illness.  Work note given.  She verbalized understanding of instructions.  Final Clinical Impressions(s) / UC Diagnoses   Final diagnoses:  Viral illness     Discharge Instructions      You will receive a call if your COVID test results are positive.   We are treating you for viral illness today.  Please make sure to stay hydrated drinking at least 8 cups of water daily.    You may rotate Tylenol and ibuprofen every 4-6 hours.  For example, take Tylenol now and  then 4 to 6 hours later take ibuprofen.  You can use Tylenol for fever and moderate pain, you can take this medication every 4-6 hours, please do not take more than 3000 mg in a 24-hour day.  You can take ibuprofen every 6 hours, do not take more than 2400 mg in a 24-hour day.  I advised that you do not take ibuprofen on an empty stomach, ibuprofen can cause GI problems such as GI bleeding.  Tessalon has been sent to the  pharmacy, you can take this every 8 hours as needed for cough.  Flonase has been sent to the pharmacy, you can use this 1 time daily with 2 sprays in each nostril during administration.      ED Prescriptions     Medication Sig Dispense Auth. Provider   fluticasone (FLONASE) 50 MCG/ACT nasal spray Place 2 sprays into both nostrils daily. 9.9 mL Debby Freiberg, NP   benzonatate (TESSALON) 100 MG capsule Take 1 capsule (100 mg total) by mouth every 8 (eight) hours. 16 capsule Debby Freiberg, NP   albuterol (VENTOLIN HFA) 108 (90 Base) MCG/ACT inhaler Inhale 2 puffs into the lungs every 6 (six) hours as needed for wheezing or shortness of breath. 6.7 g Debby Freiberg, NP      PDMP not reviewed this encounter.   Debby Freiberg, NP 01/02/22 1255

## 2022-01-03 DIAGNOSIS — Z419 Encounter for procedure for purposes other than remedying health state, unspecified: Secondary | ICD-10-CM | POA: Diagnosis not present

## 2022-01-03 LAB — SARS CORONAVIRUS 2 (TAT 6-24 HRS): SARS Coronavirus 2: POSITIVE — AB

## 2022-01-06 ENCOUNTER — Encounter (HOSPITAL_COMMUNITY): Payer: Self-pay

## 2022-01-06 ENCOUNTER — Ambulatory Visit (HOSPITAL_COMMUNITY)
Admission: RE | Admit: 2022-01-06 | Discharge: 2022-01-06 | Disposition: A | Discharge status: Home / Self Care | Payer: Medicaid Other | Source: Ambulatory Visit | Attending: Physician Assistant | Admitting: Physician Assistant

## 2022-01-06 VITALS — BP 122/87 | HR 73 | Temp 98.6°F | Resp 18

## 2022-01-06 DIAGNOSIS — R0602 Shortness of breath: Secondary | ICD-10-CM

## 2022-01-06 DIAGNOSIS — U071 COVID-19: Secondary | ICD-10-CM | POA: Diagnosis not present

## 2022-01-06 DIAGNOSIS — R051 Acute cough: Secondary | ICD-10-CM

## 2022-01-06 MED ORDER — ALBUTEROL SULFATE HFA 108 (90 BASE) MCG/ACT IN AERS
2.0000 | INHALATION_SPRAY | Freq: Once | RESPIRATORY_TRACT | Status: AC
  Administered 2022-01-06: 2 via RESPIRATORY_TRACT

## 2022-01-06 MED ORDER — ALBUTEROL SULFATE HFA 108 (90 BASE) MCG/ACT IN AERS
INHALATION_SPRAY | RESPIRATORY_TRACT | Status: AC
  Filled 2022-01-06: qty 6.7

## 2022-01-06 NOTE — ED Provider Notes (Signed)
[name]    CSN: [id number] Arrival date & time: 01/06/22  1014      History   Chief Complaint Chief Complaint  Patient presents with   Follow-up    Entered by patient    HPI Samantha Alexander is a 31 y.o. female.   Patient presents today for follow-up of URI symptoms.  Reports symptoms began on 01/01/2022 and she was evaluated by our clinic on 01/02/2022 at which point she tested positive for COVID-19.  Her primary reason for coming today is that she is experiencing ongoing/persistent symptoms and does not believe she is ready to go back to work and needs a note extension.  At her last visit she was prescribed albuterol, prednisone, Tessalon but was unable to pick these up from the pharmacy.  She has been using over-the-counter medication including Mucinex and Alka-Seltzer plus without improvement of symptoms.  Denies any recent antibiotics or steroids.  She reports having asthma when she was younger but has not had any issues in adulthood.  She is confident she is not pregnant but she is breast-feeding for not exclusively breast-feeding.  She reports ongoing shortness of breath, cough, body aches, fatigue, malaise, diarrhea.  Denies any chest pain, nausea, vomiting.  She is eating and drinking but feels as though to go straight through her.    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   Discordant fetal growth in twin gestation 05/10/2021   Breech presentation 05/10/2021   Postpartum care following cesarean delivery 05/10/2021    Obesity in pregnancy 04/27/2021   Chronic hypertension in pregnancy 02/15/2021   History of pre-eclampsia 02/15/2021    Past Surgical History:  Procedure Laterality Date   CESAREAN SECTION MULTI-GESTATIONAL N/A 05/10/2021   Procedure: CESAREAN SECTION MULTI-GESTATIONAL;  Surgeon: Everett Graff, MD;  Location: [facility];  Service: Obstetrics;  Laterality: N/A;   hemorrhoid ligation     WISDOM TOOTH EXTRACTION      OB History     Gravida  2   Para  2   Term  1   Preterm  1   AB      Living  3      SAB      IAB      Ectopic      Multiple  1   Live Births  3            Home Medications    Prior to Admission medications   Medication Sig Start Date End Date Taking? Authorizing Provider  albuterol (VENTOLIN HFA) 108 (90 Base) MCG/ACT inhaler Inhale 2 puffs into the lungs every 6 (six) hours as needed for wheezing or shortness of breath. 01/02/22   Flossie Dibble, NP  aspirin 81 MG EC tablet  05/29/21   [provider]  benzonatate (TESSALON) 100 MG capsule Take 1 capsule (100 mg total) by mouth every 8 (eight) hours. 01/02/22   Flossie Dibble, NP  Cholecalciferol (VITAMIN D3) 50 MCG (2000 UT) capsule Take 2,000 Units by mouth daily.  04/18/21   [provider]  fluticasone (FLONASE) 50 MCG/ACT nasal spray Place 2 sprays into both nostrils daily. 01/02/22   Flossie Dibble, NP  furosemide (LASIX) 40 MG tablet Take 1 tablet (40 mg total) by mouth once a week. 06/14/21 06/14/22  Tobb, Godfrey Pick, DO  hydrocortisone (ANUSOL-HC) 25 MG suppository Place 1 suppository (25 mg total) rectally at bedtime as needed for hemorrhoids or anal itching. 11/10/21   Zehr, Laban Emperor, PA-C  ibuprofen (ADVIL) 600 MG tablet Take 1 tablet (600 mg total) by mouth every 6 (six) hours. Patient taking differently: Take 600 mg by mouth every 6 (six) hours as needed for mild pain. 05/13/21   Noralyn Pick, FNP  iron polysaccharides (NIFEREX) 150 MG capsule Take 1 capsule  (150 mg total) by mouth daily. 02/02/21   Brunetta Genera, MD  labetalol (NORMODYNE) 100 MG tablet Take 2 tablets (200 mg total) by mouth daily. 08/01/21   Tobb, Kardie, DO  NIFEdipine (PROCARDIA XL/NIFEDICAL XL) 60 MG 24 hr tablet Take 1 tablet (60 mg total) by mouth daily. 06/14/21   Tobb, Kardie, DO  OVER THE COUNTER MEDICATION daily. Lactation supplements    [provider]  Probiotic Product (PROBIOTIC PO) Take by mouth daily.    [provider]    Family History Family History  Problem Relation Age of Onset   Hypertension Mother    Diabetes Mother    Colon polyps Mother    Hypertension Father    Diabetes Father    Colon cancer Father    Diabetes Sister    Lupus Sister    Diverticulitis Brother        died 31 surgery   Heart failure Brother    Heart disease Maternal Grandfather    Colon cancer Paternal Grandmother    Colon cancer Paternal Uncle    Prostate cancer Paternal Uncle    Breast cancer Maternal Aunt    Ovarian cancer Maternal Aunt    Esophageal cancer Maternal Uncle        smoker    Social History Social History   Tobacco Use   Smoking status: Never   Smokeless tobacco: Never  Vaping Use   Vaping Use: Never used  Substance Use Topics   Alcohol use: Not Currently   Drug use: Never     Allergies   Fentanyl   Review of Systems Review of Systems  Constitutional:  Positive for activity change. Negative for appetite change, fatigue and fever.  HENT:  Positive for congestion. Negative for sinus pressure, sneezing and sore throat.   Respiratory:  Positive for cough and shortness of breath. Negative for chest tightness and wheezing.   Cardiovascular:  Negative for chest pain.  Gastrointestinal:  Positive for diarrhea. Negative for abdominal pain, nausea and vomiting.  Musculoskeletal:  Positive for arthralgias and myalgias.  Neurological:  Positive for headaches. Negative for dizziness and light-headedness.     Physical  Exam Triage Vital Signs ED Triage Vitals  Enc Vitals Group     BP 01/06/22 1034 122/87     Pulse Rate 01/06/22 1034 73     Resp 01/06/22 1034 18     Temp 01/06/22 1034 98.6 F (37 C)     Temp Source 01/06/22 1034 Oral     SpO2 01/06/22 1034 99 %     Weight --      Height --      Head Circumference --      Peak Flow --  Pain Score 01/06/22 1037 0     Pain Loc --      Pain Edu? --      Excl. in GC? --    No data found.  Updated Vital Signs BP 122/87   Pulse 73   Temp 98.6 F (37 C) (Oral)   Resp 18   LMP 01/03/2022 (Exact Date)   SpO2 99%   Breastfeeding Yes Comment: "pumping"  Visual Acuity Right Eye Distance:   Left Eye Distance:   Bilateral Distance:    Right Eye Near:   Left Eye Near:    Bilateral Near:     Physical Exam Vitals reviewed.  Constitutional:      General: She is awake. She is not in acute distress.    Appearance: Normal appearance. She is well-developed. She is not ill-appearing.     Comments: Very pleasant female appears stated age in no acute distress sitting comfortably in exam room  HENT:     Head: Normocephalic and atraumatic.     Right Ear: Tympanic membrane, ear canal and external ear normal. Tympanic membrane is not erythematous or bulging.     Left Ear: Tympanic membrane, ear canal and external ear normal. Tympanic membrane is not erythematous or bulging.     Nose:     Right Sinus: No maxillary sinus tenderness or frontal sinus tenderness.     Left Sinus: No maxillary sinus tenderness or frontal sinus tenderness.     Mouth/Throat:     Pharynx: Uvula midline. No oropharyngeal exudate or posterior oropharyngeal erythema.  Cardiovascular:     Rate and Rhythm: Normal rate and regular rhythm.     Heart sounds: Normal heart sounds, S1 normal and S2 normal. No murmur heard. Pulmonary:     Effort: Pulmonary effort is normal.     Breath sounds: Normal breath sounds. No wheezing, rhonchi or rales.     Comments: Reactive cough with deep  breathing Psychiatric:        Behavior: Behavior is cooperative.      UC Treatments / Results  Labs (all labs ordered are listed, but only abnormal results are displayed) Labs Reviewed - No data to display  EKG   Radiology No results found.  Procedures Procedures (including critical care time)  Medications Ordered in UC Medications  albuterol (VENTOLIN HFA) 108 (90 Base) MCG/ACT inhaler 2 puff (has no administration in time range)    Initial Impression / Assessment and Plan / UC Course  I have reviewed the triage vital signs and the nursing notes.  Pertinent labs & imaging results that were available during my care of the patient were reviewed by me and considered in my medical decision making (see chart for details).     Patient is well-appearing, afebrile, nontoxic, nontachycardic, with oxygen saturation of 99%.  Patient is outside the window of effectiveness for Paxlovid.  No evidence of acute infection on physical exam that would warrant initiation of antibiotics.  She was given albuterol in clinic with improvement of symptoms.  She was sent home with albuterol inhaler with instruction to use this every 4-6 hours as needed.  Discussed that she should consider picking up prednisone that was previously prescribed.  Also recommended that she obtain a pulse oximeter and monitor oxygen saturation with instruction to return here if her oxygen saturation is 93% or below but above 90% go to the emergency room if at any point it drops below 90%.  Discussed that she is to rest and drink plenty of  fluid.  She can continue over-the-counter medication for symptom management.  Updated work excuse note was provided allowing her to return on 01/09/2022.  Discussed that if her symptoms are not improving she should return.  Discussed that if she has any worsening symptoms that she needs to go to the emergency room immediately including chest pain, shortness of breath, weakness.  Final Clinical  Impressions(s) / UC Diagnoses   Final diagnoses:  [id number] virus infection  Acute cough  Shortness of breath   Discharge Instructions   None    ED Prescriptions   None    PDMP not reviewed this encounter.   Terrilee Croak, PA-C 01/06/22 1220

## 2022-01-06 NOTE — Discharge Instructions (Signed)
Pick up the prednisone from the pharmacy and use this as prescribed to help with your breathing.  Use albuterol inhaler every 4-6 hours as needed.  Continue over-the-counter medication.  Do not take NSAIDs with prednisone including aspirin, ibuprofen/Advil, naproxen/Aleve.  If your symptoms are not improving he should return for reevaluation.  If at any point anything worsens you need to go to the emergency room immediately.  Please obtain pulse oximeter from the pharmacy and monitor your oxygen saturation.  If this drops below 93% you should be evaluated by [name] or another clinic.  If this drops below 90% you must go to the emergency room immediately.  If you develop any worsening symptoms including chest pain, persistent shortness of breath, weakness, nausea, vomiting you need to go to the emergency room.

## 2022-01-06 NOTE — ED Triage Notes (Signed)
Pt was seen 10/31 for cough, congestion onset 10/30. States she is requesting an extension of her work excuse, as she is feeling worse. C/O chills, tactile fevers, body aches, diarrhea onset yesterday. Continues with cough and congestion, states she occasionally feels dyspneic. States she has not been able to get Rxs filled from 10/31.

## 2022-01-15 ENCOUNTER — Other Ambulatory Visit: Payer: Self-pay

## 2022-02-02 DIAGNOSIS — Z419 Encounter for procedure for purposes other than remedying health state, unspecified: Secondary | ICD-10-CM | POA: Diagnosis not present

## 2022-02-20 ENCOUNTER — Encounter: Payer: Self-pay | Admitting: Family

## 2022-02-20 ENCOUNTER — Ambulatory Visit (INDEPENDENT_AMBULATORY_CARE_PROVIDER_SITE_OTHER): Payer: Medicaid Other | Admitting: Family

## 2022-02-20 VITALS — BP 116/72 | HR 76 | Temp 98.7°F | Resp 18 | Ht 68.0 in | Wt 223.0 lb

## 2022-02-20 DIAGNOSIS — F419 Anxiety disorder, unspecified: Secondary | ICD-10-CM

## 2022-02-20 DIAGNOSIS — O10919 Unspecified pre-existing hypertension complicating pregnancy, unspecified trimester: Secondary | ICD-10-CM

## 2022-02-20 DIAGNOSIS — Z8616 Personal history of COVID-19: Secondary | ICD-10-CM | POA: Diagnosis not present

## 2022-02-20 DIAGNOSIS — D509 Iron deficiency anemia, unspecified: Secondary | ICD-10-CM | POA: Diagnosis not present

## 2022-02-20 NOTE — Progress Notes (Signed)
Samantha Alexander is a 31 y.o. female with the following history as recorded in EpicCare:  Patient Active Problem List   Diagnosis Date Noted   Rectal bleeding 11/10/2021   Family history of colon cancer in father 11/10/2021   Postpartum hypertension 10/11/2021   Acute blood loss anemia 05/12/2021   Chronic peripheral venous hypertension 05/10/2021   Twin gestation, dichorionic diamniotic 05/10/2021   Discordant fetal growth in twin gestation 05/10/2021   Breech presentation 05/10/2021   Postpartum care following cesarean delivery 05/10/2021   Obesity in pregnancy 04/27/2021   Chronic hypertension in pregnancy 02/15/2021   History of pre-eclampsia 02/15/2021    Current Outpatient Medications  Medication Sig Dispense Refill   aspirin 81 MG EC tablet Taking daily prn     Cholecalciferol (VITAMIN D3) 50 MCG (2000 UT) capsule Take 2,000 Units by mouth daily.     furosemide (LASIX) 40 MG tablet Take 1 tablet (40 mg total) by mouth once a week. 13 tablet 3   hydrocortisone (ANUSOL-HC) 25 MG suppository Place 1 suppository (25 mg total) rectally at bedtime as needed for hemorrhoids or anal itching. 14 suppository 1   ibuprofen (ADVIL) 600 MG tablet Take 1 tablet (600 mg total) by mouth every 6 (six) hours. (Patient taking differently: Take 600 mg by mouth every 6 (six) hours as needed for mild pain.) 30 tablet 0   iron polysaccharides (NIFEREX) 150 MG capsule Take 1 capsule (150 mg total) by mouth daily. 30 capsule 5   OVER THE COUNTER MEDICATION daily. Lactation supplements     Probiotic Product (PROBIOTIC PO) Take by mouth daily.     labetalol (NORMODYNE) 100 MG tablet Take 2 tablets (200 mg total) by mouth daily. (Patient not taking: Reported on 02/20/2022) 180 tablet 3   NIFEdipine (PROCARDIA XL/NIFEDICAL XL) 60 MG 24 hr tablet Take 1 tablet (60 mg total) by mouth daily. (Patient not taking: Reported on 02/20/2022) 90 tablet 3   No current facility-administered medications for this visit.     Allergies: Fentanyl  Past Medical History:  Diagnosis Date   Abscess of vulva    Anemia    Headache    HPV (human papilloma virus) anogenital infection    Pregnancy induced hypertension     Past Surgical History:  Procedure Laterality Date   CESAREAN SECTION MULTI-GESTATIONAL N/A 05/10/2021   Procedure: CESAREAN SECTION MULTI-GESTATIONAL;  Surgeon: Osborn Coho, MD;  Location: [facility];  Service: Obstetrics;  Laterality: N/A;   hemorrhoid ligation     WISDOM TOOTH EXTRACTION      Family History  Problem Relation Age of Onset   Hypertension Mother    Diabetes Mother    Colon polyps Mother    Hypertension Father    Diabetes Father    Colon cancer Father    Diabetes Sister    Lupus Sister    Diverticulitis Brother        died durning surgery   Heart failure Brother    Heart disease Maternal Grandfather    Colon cancer Paternal Grandmother    Colon cancer Paternal Uncle    Prostate cancer Paternal Uncle    Breast cancer Maternal Aunt    Ovarian cancer Maternal Aunt    Esophageal cancer Maternal Uncle        smoker    Social History   Tobacco Use   Smoking status: Never   Smokeless tobacco: Never  Substance Use Topics   Alcohol use: Not Currently    Subjective:   Presents today  as a new patient; is requesting FMLA paperwork be completed to cover for her absences from work for the 1st week in November- seen in the ER on 10/31 and 11/4 with COVID; notes was out of work from November 2-7; actually has completed FMLA paperwork with her today for this issue that was done by another provider; she notes they used the wrong dates and she has been unable to get them to complete again. Patient has not been seen by any [facility] primary care clinic prior to today in reviewing notes;   Patient notes she is working with cardiology for management of her blood pressure. She checks her pressure daily and has been given parameters by Dr. Servando Salina for how she should take her medication.  She is scheduled to follow up there in June 2024;  Chronic history of anemia- has seen Dr. Candise Che in the past; overdue for appointment; is agreeable to referral there; does not want any labs done here today;     Objective:  Vitals:   02/20/22 1326  BP: 116/72  Pulse: 76  Resp: 18  Temp: 98.7 F (37.1 C)  TempSrc: Oral  SpO2: 98%  Weight: 223 lb (101.2 kg)  Height: 5\' 8"  (1.727 m)    General: Well developed, well nourished, in no acute distress  Skin : Warm and dry.  Head: Normocephalic and atraumatic  Eyes: Sclera and conjunctiva clear; pupils round and reactive to light; extraocular movements intact  Ears: External normal; canals clear; tympanic membranes normal  Oropharynx: Pink, supple. No suspicious lesions  Neck: Supple without thyromegaly, adenopathy  Lungs: Respirations unlabored; clear to auscultation bilaterally without wheeze, rales, rhonchi  CVS exam: normal rate and regular rhythm.  Neurologic: Alert and oriented; speech intact; face symmetrical; moves all extremities well; CNII-XII intact without focal deficit   Assessment:  1. History of COVID-19   2. Anxiety   3. Iron deficiency anemia, unspecified iron deficiency anemia type   4. Chronic hypertension in pregnancy     Plan:   I did explain to patient that we could not complete FMLA for a date prior to when she was established patient here. She is asked to call the provider who has already completed the forms for her and ask them to make the necessary adjustments.  Upon reviewing positive depression screen with patient, she becomes very tearful and upset in the office; she admits her personal stress level is extremely high due to restraining order in place with her children's father. She does not have family living in the area and does not feel she can go to her family for help. She has friends who will allow her to stay with them if she feels unsafe in her home. She notes that employer is aware of restraining order  and she feels safe at her job. She is working with victim resource center which is helping to find her long term therapy options. She is adamant that she does not want any type of medication. She is agreeable to referral being updated for outpatient therapist. Discussed with her that can offer social work referral as well to consider resources to help her but she is again adamant that she does not want this referral done. I repeatedly asked patient to re-consider this and stressed that we wanted to do what we could to help take care of her. She notes she will call back if she changes her mind. Patient defers labs today; referral updated; Under care of cardiology- she will continue to  follow the parameters given to her by her cardiologist for management ( Dr. Servando Salina);    No follow-ups on file.  Orders Placed This Encounter  Procedures   Ambulatory referral to Psychology    Referral Priority:   Routine    Referral Type:   Psychiatric    Referral Reason:   Specialty Services Required    Requested Specialty:   Psychology    Number of Visits Requested:   1   Ambulatory referral to Hematology / Oncology    Referral Priority:   Routine    Referral Type:   Consultation    Referral Reason:   Specialty Services Required    Requested Specialty:   Oncology    Number of Visits Requested:   1    Requested Prescriptions    No prescriptions requested or ordered in this encounter

## 2022-03-05 DIAGNOSIS — Z419 Encounter for procedure for purposes other than remedying health state, unspecified: Secondary | ICD-10-CM | POA: Diagnosis not present

## 2022-03-21 ENCOUNTER — Inpatient Hospital Stay: Payer: Medicaid Other | Attending: Hematology | Admitting: Hematology

## 2022-03-21 ENCOUNTER — Inpatient Hospital Stay: Payer: Medicaid Other

## 2022-04-05 DIAGNOSIS — Z419 Encounter for procedure for purposes other than remedying health state, unspecified: Secondary | ICD-10-CM | POA: Diagnosis not present

## 2022-04-22 ENCOUNTER — Other Ambulatory Visit: Payer: Self-pay | Admitting: Hematology

## 2022-05-04 DIAGNOSIS — Z419 Encounter for procedure for purposes other than remedying health state, unspecified: Secondary | ICD-10-CM | POA: Diagnosis not present

## 2022-05-08 ENCOUNTER — Telehealth: Payer: Self-pay | Admitting: Hematology

## 2022-05-08 NOTE — Telephone Encounter (Signed)
Called patient per 3/5 IB message to schedule for f/u. Patient scheduled and notified.

## 2022-05-11 ENCOUNTER — Encounter: Payer: Self-pay | Admitting: Family

## 2022-05-11 ENCOUNTER — Ambulatory Visit (INDEPENDENT_AMBULATORY_CARE_PROVIDER_SITE_OTHER): Payer: Medicaid Other | Admitting: Family

## 2022-05-11 VITALS — BP 122/74 | HR 88 | Resp 18 | Ht 68.0 in | Wt 223.0 lb

## 2022-05-11 DIAGNOSIS — I10 Essential (primary) hypertension: Secondary | ICD-10-CM | POA: Diagnosis not present

## 2022-05-11 DIAGNOSIS — R5383 Other fatigue: Secondary | ICD-10-CM | POA: Diagnosis not present

## 2022-05-11 DIAGNOSIS — D509 Iron deficiency anemia, unspecified: Secondary | ICD-10-CM

## 2022-05-11 DIAGNOSIS — R7309 Other abnormal glucose: Secondary | ICD-10-CM | POA: Diagnosis not present

## 2022-05-11 LAB — CBC WITH DIFFERENTIAL/PLATELET
Basophils Absolute: 0 10*3/uL (ref 0.0–0.1)
Basophils Relative: 0.5 % (ref 0.0–3.0)
Eosinophils Absolute: 0.1 10*3/uL (ref 0.0–0.7)
Eosinophils Relative: 1.3 % (ref 0.0–5.0)
HCT: 32 % — ABNORMAL LOW (ref 36.0–46.0)
Hemoglobin: 10 g/dL — ABNORMAL LOW (ref 12.0–15.0)
Lymphocytes Relative: 28.5 % (ref 12.0–46.0)
Lymphs Abs: 1.8 10*3/uL (ref 0.7–4.0)
MCHC: 31.1 g/dL (ref 30.0–36.0)
MCV: 70.5 fl — ABNORMAL LOW (ref 78.0–100.0)
Monocytes Absolute: 0.4 10*3/uL (ref 0.1–1.0)
Monocytes Relative: 6.8 % (ref 3.0–12.0)
Neutro Abs: 3.9 10*3/uL (ref 1.4–7.7)
Neutrophils Relative %: 62.9 % (ref 43.0–77.0)
Platelets: 352 10*3/uL (ref 150.0–400.0)
RBC: 4.54 Mil/uL (ref 3.87–5.11)
RDW: 17.6 % — ABNORMAL HIGH (ref 11.5–15.5)
WBC: 6.2 10*3/uL (ref 4.0–10.5)

## 2022-05-11 LAB — COMPREHENSIVE METABOLIC PANEL
ALT: 12 U/L (ref 0–35)
AST: 14 U/L (ref 0–37)
Albumin: 4.2 g/dL (ref 3.5–5.2)
Alkaline Phosphatase: 93 U/L (ref 39–117)
BUN: 14 mg/dL (ref 6–23)
CO2: 27 mEq/L (ref 19–32)
Calcium: 9.6 mg/dL (ref 8.4–10.5)
Chloride: 105 mEq/L (ref 96–112)
Creatinine, Ser: 0.79 mg/dL (ref 0.40–1.20)
GFR: 100.07 mL/min (ref >60.00)
Glucose, Bld: 81 mg/dL (ref 70–99)
Potassium: 4 mEq/L (ref 3.5–5.1)
Sodium: 140 mEq/L (ref 135–145)
Total Bilirubin: 0.4 mg/dL (ref 0.2–1.2)
Total Protein: 6.9 g/dL (ref 6.0–8.3)

## 2022-05-11 LAB — TSH: TSH: 0.64 u[IU]/mL (ref 0.35–5.50)

## 2022-05-11 LAB — HEMOGLOBIN A1C: Hgb A1c MFr Bld: 5.8 % (ref 4.6–6.5)

## 2022-05-11 NOTE — Progress Notes (Signed)
Samantha Alexander is a 32 y.o. female with the following history as recorded in EpicCare:  Patient Active Problem List   Diagnosis Date Noted   Rectal bleeding 11/10/2021   Family history of colon cancer in father 11/10/2021   Postpartum hypertension 10/11/2021   Acute blood loss anemia 05/12/2021   Chronic peripheral venous hypertension 05/10/2021   Twin gestation, dichorionic diamniotic 05/10/2021   Discordant fetal growth in twin gestation 05/10/2021   Breech presentation 05/10/2021   Postpartum care following cesarean delivery 05/10/2021   Obesity in pregnancy 04/27/2021   Chronic hypertension in pregnancy 02/15/2021   History of pre-eclampsia 02/15/2021    Current Outpatient Medications  Medication Sig Dispense Refill   aspirin 81 MG EC tablet Taking daily prn     Cholecalciferol (VITAMIN D3) 50 MCG (2000 UT) capsule Take 2,000 Units by mouth daily.     FERREX 150 150 MG capsule Take 1 capsule by mouth once daily 30 capsule 0   furosemide (LASIX) 40 MG tablet Take 1 tablet (40 mg total) by mouth once a week. 13 tablet 3   hydrocortisone (ANUSOL-HC) 25 MG suppository Place 1 suppository (25 mg total) rectally at bedtime as needed for hemorrhoids or anal itching. 14 suppository 1   ibuprofen (ADVIL) 600 MG tablet Take 1 tablet (600 mg total) by mouth every 6 (six) hours. (Patient taking differently: Take 600 mg by mouth every 6 (six) hours as needed for mild pain.) 30 tablet 0   labetalol (NORMODYNE) 100 MG tablet Take 2 tablets (200 mg total) by mouth daily. 180 tablet 3   NIFEdipine (PROCARDIA XL/NIFEDICAL XL) 60 MG 24 hr tablet Take 1 tablet (60 mg total) by mouth daily. 90 tablet 3   Probiotic Product (PROBIOTIC PO) Take by mouth daily.     No current facility-administered medications for this visit.    Allergies: Fentanyl  Past Medical History:  Diagnosis Date   Abscess of vulva    Anemia    Headache    HPV (human papilloma virus) anogenital infection    Pregnancy  induced hypertension     Past Surgical History:  Procedure Laterality Date   CESAREAN SECTION MULTI-GESTATIONAL N/A 05/10/2021   Procedure: CESAREAN SECTION MULTI-GESTATIONAL;  Surgeon: Everett Graff, MD;  Location: [facility];  Service: Obstetrics;  Laterality: N/A;   hemorrhoid ligation     WISDOM TOOTH EXTRACTION      Family History  Problem Relation Age of Onset   Hypertension Mother    Diabetes Mother    Colon polyps Mother    Hypertension Father    Diabetes Father    Colon cancer Father    Diabetes Sister    Lupus Sister    Diverticulitis Brother        died 39 surgery   Heart failure Brother    Heart disease Maternal Grandfather    Colon cancer Paternal Grandmother    Colon cancer Paternal Uncle    Prostate cancer Paternal Uncle    Breast cancer Maternal Aunt    Ovarian cancer Maternal Aunt    Esophageal cancer Maternal Uncle        smoker    Social History   Tobacco Use   Smoking status: Never   Smokeless tobacco: Never  Substance Use Topics   Alcohol use: Not Currently    Subjective:   Notes that she felt "very light headed" from last Sunday- Wednesday; felt very tired/ weak; no fever; requesting to get her labs updated today;   Does have  history of severe anemia- managed by hematology; scheduled to be seen next month for follow up with hematologist;   LMP 04/27/22- notes that periods are very heavy/ does not want to take OCPs at this time;  History of hypertension- under care of cardiology; only taking her medications as needed when her blood pressure is up; taking Lasix once per week;     Objective:  Vitals:   05/11/22 0849  BP: 122/74  Pulse: 88  Resp: 18  SpO2: 98%  Weight: 223 lb (101.2 kg)  Height: '5\' 8"'$  (1.727 m)    General: Well developed, well nourished, in no acute distress  Skin : Warm and dry.  Head: Normocephalic and atraumatic  Lungs: Respirations unlabored; clear to auscultation bilaterally without wheeze, rales, rhonchi  CVS  exam: normal rate and regular rhythm.  Neurologic: Alert and oriented; speech intact; face symmetrical; moves all extremities well; CNII-XII intact without focal deficit   Assessment:  1. Other fatigue   2. Elevated glucose   3. Iron deficiency anemia, unspecified iron deficiency anemia type   4. Primary hypertension     Plan:  Will update labs as requested; she is already scheduled to see her hematologist in follow up next month;  She is encouraged to schedule a follow up with her cardiologist as well- concerned for how she is supposed to take her medication; patient notes she has clarified with her cardiology and was told she was to take her medications prn and go to ER if blood pressure above 140;   No follow-ups on file.  Orders Placed This Encounter  Procedures   CBC with Differential/Platelet   Iron, TIBC and Ferritin Panel   Comp Met (CMET)   Hemoglobin A1c   TSH    Requested Prescriptions    No prescriptions requested or ordered in this encounter

## 2022-05-12 LAB — IRON,TIBC AND FERRITIN PANEL
%SAT: 7 % (calc) — ABNORMAL LOW (ref 16–45)
Ferritin: 10 ng/mL — ABNORMAL LOW (ref 16–154)
Iron: 30 ug/dL — ABNORMAL LOW (ref 40–190)
TIBC: 437 mcg/dL (calc) (ref 250–450)

## 2022-06-04 ENCOUNTER — Telehealth: Payer: Self-pay | Admitting: Family

## 2022-06-04 DIAGNOSIS — Z419 Encounter for procedure for purposes other than remedying health state, unspecified: Secondary | ICD-10-CM | POA: Diagnosis not present

## 2022-06-04 NOTE — Telephone Encounter (Signed)
Forms are placed on nurse desk

## 2022-06-04 NOTE — Telephone Encounter (Signed)
Pt dropped off document to be filled out by provider ([employer]work in a yellow large envelope) Pt would like document to be faxed when ready to Fax [phone]. Document put at front office tray under providers name.

## 2022-06-05 ENCOUNTER — Telehealth (INDEPENDENT_AMBULATORY_CARE_PROVIDER_SITE_OTHER): Payer: Medicaid Other | Admitting: Family

## 2022-06-05 ENCOUNTER — Encounter: Payer: Self-pay | Admitting: Family

## 2022-06-05 VITALS — Ht 68.0 in | Wt 222.0 lb

## 2022-06-05 DIAGNOSIS — D509 Iron deficiency anemia, unspecified: Secondary | ICD-10-CM | POA: Diagnosis not present

## 2022-06-05 DIAGNOSIS — I10 Essential (primary) hypertension: Secondary | ICD-10-CM | POA: Diagnosis not present

## 2022-06-05 NOTE — Progress Notes (Signed)
Samantha Alexander is a 32 y.o. female with the following history as recorded in Epic[employer]:  Patient Active Problem List   Diagnosis Date Noted   Rectal bleeding 11/10/2021   Family history of colon cancer in father 11/10/2021   Postpartum hypertension 10/11/2021   Acute blood loss anemia 05/12/2021   Chronic peripheral venous hypertension 05/10/2021   Twin gestation, dichorionic diamniotic 05/10/2021   Discordant fetal growth in twin gestation 05/10/2021   Breech presentation 05/10/2021   Postpartum care following cesarean delivery 05/10/2021   Obesity in pregnancy 04/27/2021   Chronic hypertension in pregnancy 02/15/2021   History of pre-eclampsia 02/15/2021    Current Outpatient Medications  Medication Sig Dispense Refill   aspirin 81 MG EC tablet Taking daily prn     Cholecalciferol (VITAMIN D3) 50 MCG (2000 UT) capsule Take 2,000 Units by mouth daily.     FERREX 150 150 MG capsule Take 1 capsule by mouth once daily 30 capsule 0   furosemide (LASIX) 40 MG tablet Take 1 tablet (40 mg total) by mouth once a week. 13 tablet 3   hydrocortisone (ANUSOL-HC) 25 MG suppository Place 1 suppository (25 mg total) rectally at bedtime as needed for hemorrhoids or anal itching. 14 suppository 1   ibuprofen (ADVIL) 600 MG tablet Take 1 tablet (600 mg total) by mouth every 6 (six) hours. (Patient taking differently: Take 600 mg by mouth every 6 (six) hours as needed for mild pain.) 30 tablet 0   labetalol (NORMODYNE) 100 MG tablet Take 2 tablets (200 mg total) by mouth daily. 180 tablet 3   NIFEdipine (PROCARDIA XL/NIFEDICAL XL) 60 MG 24 hr tablet Take 1 tablet (60 mg total) by mouth daily. 90 tablet 3   Probiotic Product (PROBIOTIC PO) Take by mouth daily.     No current facility-administered medications for this visit.    Allergies: Fentanyl  Past Medical History:  Diagnosis Date   Abscess of vulva    Anemia    Headache    HPV (human papilloma virus) anogenital infection    Pregnancy  induced hypertension     Past Surgical History:  Procedure Laterality Date   CESAREAN SECTION MULTI-GESTATIONAL N/A 05/10/2021   Procedure: CESAREAN SECTION MULTI-GESTATIONAL;  Surgeon: Everett Graff, MD;  Location: [facility];  Service: Obstetrics;  Laterality: N/A;   hemorrhoid ligation     WISDOM TOOTH EXTRACTION      Family History  Problem Relation Age of Onset   Hypertension Mother    Diabetes Mother    Colon polyps Mother    Hypertension Father    Diabetes Father    Colon cancer Father    Diabetes Sister    Lupus Sister    Diverticulitis Brother        died 57 surgery   Heart failure Brother    Heart disease Maternal Grandfather    Colon cancer Paternal Grandmother    Colon cancer Paternal Uncle    Prostate cancer Paternal Uncle    Breast cancer Maternal Aunt    Ovarian cancer Maternal Aunt    Esophageal cancer Maternal Uncle        smoker    Social History   Tobacco Use   Smoking status: Never   Smokeless tobacco: Never  Substance Use Topics   Alcohol use: Not Currently    Subjective:    I connected with Samantha Alexander on 06/05/22 at  2:20 PM EDT by a video enabled telemedicine application and verified that I am speaking with the correct  person using two identifiers.   I discussed the limitations of evaluation and management by telemedicine and the availability of in person appointments. The patient expressed understanding and agreed to proceed. Provider in office/ patient is at home; provider and patient are only 2 people on video call.   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 She is requesting accommodations at her job to include: 1) stool under her desk to limit swelling in her feet; 2) blanket to help with being cold due to anemia; 3) options to use the restroom more frequently due to side effects of her diuretic.  She would like to review paperwork provided by her employer to make sure it is able to be completed in a timely  manner.     Objective:  Vitals:   06/05/22 1429  Weight: 222 lb (100.7 kg)  Height: 5\' 8"  (1.727 m)    General: Well developed, well nourished, in no acute distress  Skin : Warm and dry.  Head: Normocephalic and atraumatic  Lungs: Respirations unlabored;  Neurologic: Alert and oriented; speech intact; face symmetrical;   Assessment:  1. Iron deficiency anemia, unspecified iron deficiency anemia type   2. Primary hypertension     Plan:  Reviewed paperwork with patient and agree that accommodations requests are reasonable; paperwork is completed as requested. She is scheduled to see her hematologist regarding her anemia and iron infusions and is planning to schedule a follow up with her cardiologist regarding her blood pressure management.   No follow-ups on file.  No orders of the defined types were placed in this encounter.   Requested Prescriptions    No prescriptions requested or ordered in this encounter

## 2022-06-22 ENCOUNTER — Other Ambulatory Visit: Payer: Self-pay

## 2022-06-22 DIAGNOSIS — O99012 Anemia complicating pregnancy, second trimester: Secondary | ICD-10-CM

## 2022-06-22 DIAGNOSIS — D509 Iron deficiency anemia, unspecified: Secondary | ICD-10-CM

## 2022-06-25 ENCOUNTER — Inpatient Hospital Stay (HOSPITAL_BASED_OUTPATIENT_CLINIC_OR_DEPARTMENT_OTHER): Payer: Medicaid Other | Admitting: Hematology

## 2022-06-25 ENCOUNTER — Inpatient Hospital Stay: Payer: Medicaid Other | Attending: Hematology

## 2022-06-25 VITALS — BP 147/95 | HR 65 | Temp 97.9°F | Resp 20 | Wt 220.7 lb

## 2022-06-25 DIAGNOSIS — Z8042 Family history of malignant neoplasm of prostate: Secondary | ICD-10-CM | POA: Insufficient documentation

## 2022-06-25 DIAGNOSIS — N920 Excessive and frequent menstruation with regular cycle: Secondary | ICD-10-CM | POA: Diagnosis not present

## 2022-06-25 DIAGNOSIS — E538 Deficiency of other specified B group vitamins: Secondary | ICD-10-CM | POA: Insufficient documentation

## 2022-06-25 DIAGNOSIS — Z8041 Family history of malignant neoplasm of ovary: Secondary | ICD-10-CM | POA: Insufficient documentation

## 2022-06-25 DIAGNOSIS — Z800 Family history of malignant neoplasm of digestive organs: Secondary | ICD-10-CM | POA: Insufficient documentation

## 2022-06-25 DIAGNOSIS — D509 Iron deficiency anemia, unspecified: Secondary | ICD-10-CM

## 2022-06-25 DIAGNOSIS — D500 Iron deficiency anemia secondary to blood loss (chronic): Secondary | ICD-10-CM | POA: Insufficient documentation

## 2022-06-25 DIAGNOSIS — Z803 Family history of malignant neoplasm of breast: Secondary | ICD-10-CM | POA: Diagnosis not present

## 2022-06-25 LAB — CBC WITH DIFFERENTIAL (CANCER CENTER ONLY)
Abs Immature Granulocytes: 0.02 10*3/uL (ref 0.00–0.07)
Basophils Absolute: 0 10*3/uL (ref 0.0–0.1)
Basophils Relative: 0 %
Eosinophils Absolute: 0.1 10*3/uL (ref 0.0–0.5)
Eosinophils Relative: 1 %
HCT: 30.4 % — ABNORMAL LOW (ref 36.0–46.0)
Hemoglobin: 9.2 g/dL — ABNORMAL LOW (ref 12.0–15.0)
Immature Granulocytes: 0 %
Lymphocytes Relative: 29 %
Lymphs Abs: 2 10*3/uL (ref 0.7–4.0)
MCH: 21.6 pg — ABNORMAL LOW (ref 26.0–34.0)
MCHC: 30.3 g/dL (ref 30.0–36.0)
MCV: 71.4 fL — ABNORMAL LOW (ref 80.0–100.0)
Monocytes Absolute: 0.4 10*3/uL (ref 0.1–1.0)
Monocytes Relative: 7 %
Neutro Abs: 4.2 10*3/uL (ref 1.7–7.7)
Neutrophils Relative %: 63 %
Platelet Count: 333 10*3/uL (ref 150–400)
RBC: 4.26 MIL/uL (ref 3.87–5.11)
RDW: 17.2 % — ABNORMAL HIGH (ref 11.5–15.5)
WBC Count: 6.7 10*3/uL (ref 4.0–10.5)
nRBC: 0 % (ref 0.0–0.2)

## 2022-06-25 LAB — CMP (CANCER CENTER ONLY)
ALT: 9 U/L (ref 0–44)
AST: 12 U/L — ABNORMAL LOW (ref 15–41)
Albumin: 4.3 g/dL (ref 3.5–5.0)
Alkaline Phosphatase: 97 U/L (ref 38–126)
Anion gap: 5 (ref 5–15)
BUN: 12 mg/dL (ref 6–20)
CO2: 28 mmol/L (ref 22–32)
Calcium: 9.7 mg/dL (ref 8.9–10.3)
Chloride: 107 mmol/L (ref 98–111)
Creatinine: 0.8 mg/dL (ref 0.44–1.00)
GFR, Estimated: >60 mL/min (ref >60)
Glucose, Bld: 97 mg/dL (ref 70–99)
Potassium: 3.9 mmol/L (ref 3.5–5.1)
Sodium: 140 mmol/L (ref 135–145)
Total Bilirubin: 0.3 mg/dL (ref 0.3–1.2)
Total Protein: 7.4 g/dL (ref 6.5–8.1)

## 2022-06-25 LAB — IRON AND IRON BINDING CAPACITY (CC-WL,HP ONLY)
Iron: 19 ug/dL — ABNORMAL LOW (ref 28–170)
Saturation Ratios: 4 % — ABNORMAL LOW (ref 10.4–31.8)
TIBC: 433 ug/dL (ref 250–450)
UIBC: 414 ug/dL (ref 148–442)

## 2022-06-25 LAB — VITAMIN B12: Vitamin B-12: 378 pg/mL (ref 180–914)

## 2022-06-25 LAB — FERRITIN: Ferritin: 9 ng/mL — ABNORMAL LOW (ref 11–307)

## 2022-06-25 NOTE — Progress Notes (Addendum)
HEMATOLOGY/ONCOLOGY CLINIC NOTE  Date of Service: 06/25/22    Patient Care Team: Olive Bass, FNP as PCP - General (Internal Medicine) Thomasene Ripple, DO as PCP - Cardiology (Cardiology)  CHIEF COMPLAINTS/PURPOSE OF CONSULTATION:  Patient with delayed follow-up for iron deficiency anemia after being lost to follow-up  HISTORY OF PRESENTING ILLNESS:   Samantha Alexander is a wonderful 32 y.o. female who has been referred to [name] by Dr Francisca December for evaluation and management of iron deficiency. The pt reports that she is doing well overall.  The pt reports that she was first told that she was borderline anemic when she was about 32 years old. She was never given a reason for her anemia. Pt reports that over the last few years her period has gotten heavier. Her periods are typically 5-6 days and are very heavy for about 3 days. Pt has been given PO Ferrous Sulfate but is not very consistent in taking it, typically 2-3 times per week. She simply forgets or chooses not to take them. She has experienced constipation with PO Iron. The last time she took one was about a month ago. Pt has never had an IV Iron infusion but has been offered blood transfusions multiple times, with her last being offered 9 months ago around childbirth. She did not choose to go through with a blood transfusion at that time as she was concerned about the complications. Pt has had one confirmed pregnancy and has one child. Her Hgb was between 8.7-8.3 around the time of birth. Pt has been seeing Nigel Bridgeman, CNM under the supervision of Dr. Su Hilt at [facility] and will see her again next month. She is not currently on birth control and was not given any information on why her periods are heavy. She does not have any dietary restrictions and eats a fairly balanced diet. She is currently being weaned off of Amlodipine, taking a dose about every 3 days. She was placed on Amlodipine due to gestational HTN and  eventually developed pre-eclampsia. They do not think that she has HTN at baseline. Pt breastfed for a month after birth. Her weight has been fluctuating and she denies any unexpected weight loss.   Her fatigue is limiting and keeps her from certain activities. She does note some ice cravings and regularly eats 32 oz of ice at a time. Pt has hemorrhoids but they are not actively bleeding. She denies any bloody/black stools or other bleeding concerns.   Pt's 59 y/o sister has had Leukemia for about 10 years. There is a family history of anemia in women in her family, there are no other known blood/clotting disorders. She also has an aunt with CHF. Pt is a non-smoker, has no history of drug use and does not drink EtOH outside of social situations.   On review of systems, pt reports fatigue and denies abdominal pain, urinary problems, bleeding hemorrhoids, bloody/black stools, other bleeding concerns, unexpected weight loss and any other symptoms.   On PMHx the pt reports gestational HTN, pre-eclampsia, Menorrhagia. On Social Hx the pt reports she is a non-smoker, has no history of drug use and does not drink EtOH outside of social situations On Family Hx the pt reports a sister with Leukemia and an aunt with CHF  INTERVAL HISTORY  .Samantha Alexander has been referred back to [name] for evaluation and management of iron deficiency anemia.   Patient was seen by [name] in clinic on 12/16/2018 for iron deficiency anemia due to  menorrhagia. She had opted to pursue oral iron medication and was to return in 10 weeks but did not follow up. Patient was also seen by [name] in clinic on 02/02/2021 for iron deficiency anemia due to pregnancy. During the last visit, the patient was [redacted] weeks pregnant with twins. She was scheduled for  IV Venofer 300 mg weekly for 3 doses with Tylenol 650 mg and loratadine 10 mg as premedications and was to return in 8 weeks, but the patient did not follow up.   Patient reports she has been  doing fairly well since our last visit. She has been referred back to [name] due to being iron deficient. Patient notes she has been having heavy menstrual cycle. She has been to OBGYN regarding heavy menstrual cycle and has been started on Lysteda. She has not been taking lysteda because her menstrual cycle have been normal for the past 2 months.   She was regularly taking iron supplement, but she has not been taking it for the past 1-2 weeks.   Patient denies any black stool, abnormal bleeding other than heavy menstrual cycle, fever, chills, night sweats, nausea, vomiting, unexpected weight loss, back pain, abdominal pain, or leg swelling. However, patient complains of chronic fatigue, increased dizziness, increased ice craving, and acid reflux.   She notes she had colonoscopy in September 2023, which did not show abnormalities.   She had colonoscopy with Dr. Meridee Score in September, which did not show any abnormalities.    Patient is taking Lasix once a month.    MEDICAL HISTORY:  Past Medical History:  Diagnosis Date   Abscess of vulva    Anemia    Headache    HPV (human papilloma virus) anogenital infection    Pregnancy induced hypertension     SURGICAL HISTORY: Past Surgical History:  Procedure Laterality Date   CESAREAN SECTION MULTI-GESTATIONAL N/A 05/10/2021   Procedure: CESAREAN SECTION MULTI-GESTATIONAL;  Surgeon: Osborn Coho, MD;  Location: [facility];  Service: Obstetrics;  Laterality: N/A;   hemorrhoid ligation     WISDOM TOOTH EXTRACTION      SOCIAL HISTORY: Social History   Socioeconomic History   Marital status: Single    Spouse name: Not on file   Number of children: Not on file   Years of education: Not on file   Highest education level: Not on file  Occupational History   Not on file  Tobacco Use   Smoking status: Never   Smokeless tobacco: Never  Vaping Use   Vaping Use: Never used  Substance and Sexual Activity   Alcohol use: Not Currently   Drug  use: Never   Sexual activity: Not Currently  Other Topics Concern   Not on file  Social History Narrative   Not on file   Social Determinants of Health   Financial Resource Strain: Not on file  Food Insecurity: Not on file  Transportation Needs: Not on file  Physical Activity: Not on file  Stress: Not on file  Social Connections: Not on file  Intimate Partner Violence: Not on file    FAMILY HISTORY: Family History  Problem Relation Age of Onset   Hypertension Mother    Diabetes Mother    Colon polyps Mother    Hypertension Father    Diabetes Father    Colon cancer Father    Diabetes Sister    Lupus Sister    Diverticulitis Brother        died durning surgery   Heart failure Brother  Heart disease Maternal Grandfather    Colon cancer Paternal Grandmother    Colon cancer Paternal Uncle    Prostate cancer Paternal Uncle    Breast cancer Maternal Aunt    Ovarian cancer Maternal Aunt    Esophageal cancer Maternal Uncle        smoker    ALLERGIES:  is allergic to fentanyl.  MEDICATIONS:  Current Outpatient Medications  Medication Sig Dispense Refill   aspirin 81 MG EC tablet Taking daily prn     Cholecalciferol (VITAMIN D3) 50 MCG (2000 UT) capsule Take 2,000 Units by mouth daily.     FERREX 150 150 MG capsule Take 1 capsule by mouth once daily 30 capsule 0   furosemide (LASIX) 40 MG tablet Take 1 tablet (40 mg total) by mouth once a week. 13 tablet 3   hydrocortisone (ANUSOL-HC) 25 MG suppository Place 1 suppository (25 mg total) rectally at bedtime as needed for hemorrhoids or anal itching. 14 suppository 1   ibuprofen (ADVIL) 600 MG tablet Take 1 tablet (600 mg total) by mouth every 6 (six) hours. (Patient taking differently: Take 600 mg by mouth every 6 (six) hours as needed for mild pain.) 30 tablet 0   labetalol (NORMODYNE) 100 MG tablet Take 2 tablets (200 mg total) by mouth daily. 180 tablet 3   NIFEdipine (PROCARDIA XL/NIFEDICAL XL) 60 MG 24 hr tablet Take 1  tablet (60 mg total) by mouth daily. 90 tablet 3   Probiotic Product (PROBIOTIC PO) Take by mouth daily.     No current facility-administered medications for this visit.    REVIEW OF SYSTEMS:    .10 Point review of Systems was done is negative except as noted above.   PHYSICAL EXAMINATION: ECOG PERFORMANCE STATUS: 2 - Symptomatic, <50% confined to bed  . Vitals:   06/25/22 1220  BP: (!) 147/95  Pulse: 65  Resp: 20  Temp: 97.9 F (36.6 C)  SpO2: 100%    Filed Weights   06/25/22 1220  Weight: 220 lb 11.2 oz (100.1 kg)    .Body mass index is 33.56 kg/m. Marland Kitchen GENERAL:alert, in no acute distress and comfortable SKIN: no acute rashes, no significant lesions EYES: conjunctiva are pink and non-injected, sclera anicteric OROPHARYNX: MMM, no exudates, no oropharyngeal erythema or ulceration NECK: supple, no JVD LYMPH:  no palpable lymphadenopathy in the cervical, axillary or inguinal regions LUNGS: clear to auscultation b/l with normal respiratory effort HEART: regular rate & rhythm ABDOMEN:  normoactive bowel sounds , non tender, gravid uterus Extremity: 1+ bilateral pedal edema PSYCH: alert & oriented x 3 with fluent speech NEURO: no focal motor/sensory deficits   LABORATORY DATA:  I have reviewed the data as listed  .    Latest Ref Rng & Units 06/25/2022   11:31 AM 05/11/2022    9:13 AM 05/29/2021    4:12 PM  CBC  WBC 4.0 - 10.5 K/uL 6.7  6.2  6.4   Hemoglobin 12.0 - 15.0 g/dL 9.2  09.8  11.9   Hematocrit 36.0 - 46.0 % 30.4  32.0  35.1   Platelets 150 - 400 K/uL 333  352.0  339     .    Latest Ref Rng & Units 06/25/2022   11:31 AM 05/11/2022    9:13 AM 05/29/2021    4:12 PM  CMP  Glucose 70 - 99 mg/dL 97  81  81   BUN 6 - 20 mg/dL 12  14  9    Creatinine 0.44 - 1.00 mg/dL  0.80  0.79  0.90   Sodium 135 - 145 mmol/L 140  140  140   Potassium 3.5 - 5.1 mmol/L 3.9  4.0  3.7   Chloride 98 - 111 mmol/L 107  105  107   CO2 22 - 32 mmol/L 28  27  26    Calcium 8.9 -  10.3 mg/dL 9.7  9.6  9.6   Total Protein 6.5 - 8.1 g/dL 7.4  6.9  7.0   Total Bilirubin 0.3 - 1.2 mg/dL 0.3  0.4  0.6   Alkaline Phos 38 - 126 U/L 97  93  118   AST 15 - 41 U/L 12  14  24    ALT 0 - 44 U/L 9  12  24     . Lab Results  Component Value Date   IRON 19 (L) 06/25/2022   TIBC 433 06/25/2022   IRONPCTSAT 4 (L) 06/25/2022   (Iron and TIBC)  Lab Results  Component Value Date   FERRITIN 9 (L) 06/25/2022   B12 - 267    RADIOGRAPHIC STUDIES: I have personally reviewed the radiological images as listed and agreed with the findings in the report. No results found.  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 CBC shows decreased hemoglobin at 9.2 and decreased hematocrit of 30.4. CMP is stable. Ferritin levels were decreased on 05/11/2022 at 10.  -Discussed the option of IV Iron and patient agrees to start IV Iron.  -Patient agrees to receive Iv Iron.  -Plan on IV Injectafer 750 mg weekly x 2 doses. If not approved by insurance, IV Venofer. -Patient denies any toxicities with iron supplement.  -Answered all of patient's questions.  -Continue following up with OGYN and GI.   FOLLOW-UP: IV injectafer 750mg  weekly x 2 doses RTC with Dr Candise Che with labs in 3 months  The total time spent in the appointment was 20 minutes* .  All of the patient's questions were answered with apparent satisfaction. The patient knows to call the clinic with any problems, questions or concerns.   Wyvonnia Lora MD MS AAHIVMS [facility] Hematology/Oncology Physician [facility]  .*Total Encounter Time as defined by the Centers for Medicare and Medicaid Services includes, in addition to the face-to-face time of a patient visit (documented in the note above) non-face-to-face time: obtaining and reviewing outside history, ordering and reviewing medications, tests or procedures, care coordination (communications with other health care professionals or caregivers) and documentation in the medical record.   I, Ok Edwards, am acting as a Neurosurgeon for Wyvonnia Lora, MD. .I have reviewed the above documentation for accuracy and completeness, and I agree with the above. Johney Maine MD

## 2022-06-26 ENCOUNTER — Telehealth: Payer: Self-pay | Admitting: Hematology

## 2022-06-30 ENCOUNTER — Other Ambulatory Visit: Payer: Self-pay | Admitting: Cardiology

## 2022-07-02 ENCOUNTER — Encounter: Payer: Self-pay | Admitting: Hematology

## 2022-07-04 DIAGNOSIS — Z419 Encounter for procedure for purposes other than remedying health state, unspecified: Secondary | ICD-10-CM | POA: Diagnosis not present

## 2022-07-09 ENCOUNTER — Inpatient Hospital Stay: Payer: Medicaid Other | Attending: Hematology

## 2022-07-10 ENCOUNTER — Telehealth: Payer: Self-pay | Admitting: Hematology

## 2022-07-12 ENCOUNTER — Telehealth: Payer: Self-pay | Admitting: Hematology

## 2022-07-16 ENCOUNTER — Inpatient Hospital Stay: Payer: Medicaid Other

## 2022-07-23 ENCOUNTER — Inpatient Hospital Stay: Payer: Medicaid Other

## 2022-08-04 DIAGNOSIS — Z419 Encounter for procedure for purposes other than remedying health state, unspecified: Secondary | ICD-10-CM | POA: Diagnosis not present

## 2022-08-09 ENCOUNTER — Telehealth: Payer: Self-pay | Admitting: Hematology

## 2022-08-17 ENCOUNTER — Inpatient Hospital Stay: Payer: Medicaid Other

## 2022-08-24 ENCOUNTER — Inpatient Hospital Stay: Payer: Medicaid Other

## 2022-09-03 DIAGNOSIS — Z419 Encounter for procedure for purposes other than remedying health state, unspecified: Secondary | ICD-10-CM | POA: Diagnosis not present

## 2022-09-14 ENCOUNTER — Other Ambulatory Visit: Payer: Self-pay

## 2022-09-14 ENCOUNTER — Inpatient Hospital Stay: Payer: Medicaid Other | Attending: Hematology

## 2022-09-14 VITALS — BP 129/89 | HR 76 | Temp 98.1°F | Resp 19

## 2022-09-14 DIAGNOSIS — D500 Iron deficiency anemia secondary to blood loss (chronic): Secondary | ICD-10-CM | POA: Insufficient documentation

## 2022-09-14 DIAGNOSIS — N920 Excessive and frequent menstruation with regular cycle: Secondary | ICD-10-CM | POA: Insufficient documentation

## 2022-09-14 MED ORDER — SODIUM CHLORIDE 0.9 % IV SOLN
750.0000 mg | Freq: Once | INTRAVENOUS | Status: AC
  Administered 2022-09-14: 750 mg via INTRAVENOUS
  Filled 2022-09-14: qty 15

## 2022-09-14 MED ORDER — ACETAMINOPHEN 325 MG PO TABS
650.0000 mg | ORAL_TABLET | Freq: Once | ORAL | Status: AC
  Administered 2022-09-14: 650 mg via ORAL
  Filled 2022-09-14: qty 2

## 2022-09-14 MED ORDER — SODIUM CHLORIDE 0.9 % IV SOLN: Freq: Once | INTRAVENOUS | Status: AC

## 2022-09-14 MED ORDER — LORATADINE 10 MG PO TABS
10.0000 mg | ORAL_TABLET | Freq: Once | ORAL | Status: AC
  Administered 2022-09-14: 10 mg via ORAL
  Filled 2022-09-14: qty 1

## 2022-09-14 NOTE — Progress Notes (Signed)
Pt observed for post iron infusion. Pt tolerated tx well w/o complaint. VSS at d/c. Pt ambulated to lobby.

## 2022-09-21 ENCOUNTER — Telehealth: Payer: Self-pay | Admitting: Hematology

## 2022-09-21 ENCOUNTER — Inpatient Hospital Stay: Payer: Medicaid Other

## 2022-09-28 ENCOUNTER — Other Ambulatory Visit: Payer: Self-pay

## 2022-09-28 ENCOUNTER — Inpatient Hospital Stay: Payer: Medicaid Other

## 2022-09-28 ENCOUNTER — Inpatient Hospital Stay: Payer: Medicaid Other | Admitting: Hematology

## 2022-09-28 VITALS — BP 122/76 | HR 86 | Temp 98.0°F | Resp 18

## 2022-09-28 DIAGNOSIS — N920 Excessive and frequent menstruation with regular cycle: Secondary | ICD-10-CM | POA: Diagnosis not present

## 2022-09-28 DIAGNOSIS — D500 Iron deficiency anemia secondary to blood loss (chronic): Secondary | ICD-10-CM | POA: Diagnosis not present

## 2022-09-28 MED ORDER — LORATADINE 10 MG PO TABS
10.0000 mg | ORAL_TABLET | Freq: Once | ORAL | Status: AC
  Administered 2022-09-28: 10 mg via ORAL
  Filled 2022-09-28: qty 1

## 2022-09-28 MED ORDER — SODIUM CHLORIDE 0.9 % IV SOLN: Freq: Once | INTRAVENOUS | Status: AC

## 2022-09-28 MED ORDER — ACETAMINOPHEN 325 MG PO TABS
650.0000 mg | ORAL_TABLET | Freq: Once | ORAL | Status: AC
  Administered 2022-09-28: 650 mg via ORAL
  Filled 2022-09-28: qty 2

## 2022-09-28 MED ORDER — SODIUM CHLORIDE 0.9 % IV SOLN
750.0000 mg | Freq: Once | INTRAVENOUS | Status: AC
  Administered 2022-09-28: 750 mg via INTRAVENOUS
  Filled 2022-09-28: qty 15

## 2022-09-28 NOTE — Progress Notes (Signed)
Pt declined 30 min observation post iron infusion. Pt tolerated tx well. VSS at d/c.

## 2022-10-04 DIAGNOSIS — Z419 Encounter for procedure for purposes other than remedying health state, unspecified: Secondary | ICD-10-CM | POA: Diagnosis not present

## 2022-10-13 ENCOUNTER — Other Ambulatory Visit: Payer: Self-pay | Admitting: Cardiology

## 2022-10-25 ENCOUNTER — Other Ambulatory Visit: Payer: Self-pay

## 2022-10-25 DIAGNOSIS — D500 Iron deficiency anemia secondary to blood loss (chronic): Secondary | ICD-10-CM

## 2022-10-26 ENCOUNTER — Inpatient Hospital Stay (HOSPITAL_BASED_OUTPATIENT_CLINIC_OR_DEPARTMENT_OTHER): Payer: Medicaid Other | Admitting: Hematology

## 2022-10-26 ENCOUNTER — Other Ambulatory Visit: Payer: Self-pay

## 2022-10-26 ENCOUNTER — Inpatient Hospital Stay: Payer: Medicaid Other | Attending: Hematology

## 2022-10-26 VITALS — BP 131/93 | HR 77 | Temp 98.4°F | Resp 18 | Ht 68.0 in | Wt 223.0 lb

## 2022-10-26 DIAGNOSIS — E538 Deficiency of other specified B group vitamins: Secondary | ICD-10-CM | POA: Insufficient documentation

## 2022-10-26 DIAGNOSIS — D500 Iron deficiency anemia secondary to blood loss (chronic): Secondary | ICD-10-CM | POA: Insufficient documentation

## 2022-10-26 DIAGNOSIS — Z8042 Family history of malignant neoplasm of prostate: Secondary | ICD-10-CM | POA: Diagnosis not present

## 2022-10-26 DIAGNOSIS — Z806 Family history of leukemia: Secondary | ICD-10-CM | POA: Diagnosis not present

## 2022-10-26 DIAGNOSIS — Z800 Family history of malignant neoplasm of digestive organs: Secondary | ICD-10-CM | POA: Insufficient documentation

## 2022-10-26 DIAGNOSIS — Z803 Family history of malignant neoplasm of breast: Secondary | ICD-10-CM | POA: Diagnosis not present

## 2022-10-26 DIAGNOSIS — Z8041 Family history of malignant neoplasm of ovary: Secondary | ICD-10-CM | POA: Diagnosis not present

## 2022-10-26 DIAGNOSIS — N920 Excessive and frequent menstruation with regular cycle: Secondary | ICD-10-CM | POA: Insufficient documentation

## 2022-10-26 LAB — CBC WITH DIFFERENTIAL (CANCER CENTER ONLY)
Abs Immature Granulocytes: 0.02 10*3/uL (ref 0.00–0.07)
Basophils Absolute: 0 10*3/uL (ref 0.0–0.1)
Basophils Relative: 0 %
Eosinophils Absolute: 0.1 10*3/uL (ref 0.0–0.5)
Eosinophils Relative: 1 %
HCT: 38.6 % (ref 36.0–46.0)
Hemoglobin: 11.7 g/dL — ABNORMAL LOW (ref 12.0–15.0)
Immature Granulocytes: 0 %
Lymphocytes Relative: 22 %
Lymphs Abs: 1.8 10*3/uL (ref 0.7–4.0)
MCH: 24 pg — ABNORMAL LOW (ref 26.0–34.0)
MCHC: 30.3 g/dL (ref 30.0–36.0)
MCV: 79.1 fL — ABNORMAL LOW (ref 80.0–100.0)
Monocytes Absolute: 0.6 10*3/uL (ref 0.1–1.0)
Monocytes Relative: 8 %
Neutro Abs: 5.6 10*3/uL (ref 1.7–7.7)
Neutrophils Relative %: 69 %
Platelet Count: 313 10*3/uL (ref 150–400)
RBC: 4.88 MIL/uL (ref 3.87–5.11)
RDW: 24.6 % — ABNORMAL HIGH (ref 11.5–15.5)
WBC Count: 8.1 10*3/uL (ref 4.0–10.5)
nRBC: 0 % (ref 0.0–0.2)

## 2022-10-26 LAB — CMP (CANCER CENTER ONLY)
ALT: 12 U/L (ref 0–44)
AST: 10 U/L — ABNORMAL LOW (ref 15–41)
Albumin: 4.3 g/dL (ref 3.5–5.0)
Alkaline Phosphatase: 89 U/L (ref 38–126)
Anion gap: 5 (ref 5–15)
BUN: 12 mg/dL (ref 6–20)
CO2: 29 mmol/L (ref 22–32)
Calcium: 9.3 mg/dL (ref 8.9–10.3)
Chloride: 106 mmol/L (ref 98–111)
Creatinine: 0.83 mg/dL (ref 0.44–1.00)
GFR, Estimated: >60 mL/min (ref >60)
Glucose, Bld: 88 mg/dL (ref 70–99)
Potassium: 4.1 mmol/L (ref 3.5–5.1)
Sodium: 140 mmol/L (ref 135–145)
Total Bilirubin: 0.2 mg/dL — ABNORMAL LOW (ref 0.3–1.2)
Total Protein: 7.4 g/dL (ref 6.5–8.1)

## 2022-10-26 LAB — IRON AND IRON BINDING CAPACITY (CC-WL,HP ONLY)
Iron: 79 ug/dL (ref 28–170)
Saturation Ratios: 27 % (ref 10.4–31.8)
TIBC: 290 ug/dL (ref 250–450)
UIBC: 211 ug/dL (ref 148–442)

## 2022-10-26 LAB — FERRITIN: Ferritin: 208 ng/mL (ref 11–307)

## 2022-10-26 LAB — VITAMIN B12: Vitamin B-12: 455 pg/mL (ref 180–914)

## 2022-10-26 NOTE — Progress Notes (Signed)
HEMATOLOGY/ONCOLOGY CLINIC NOTE  Date of Service: 10/26/22    Patient Care Team: Olive Bass, FNP as PCP - General (Internal Medicine) Thomasene Ripple, DO as PCP - Cardiology (Cardiology)  CHIEF COMPLAINTS/PURPOSE OF CONSULTATION:  Patient with delayed follow-up for iron deficiency anemia after being lost to follow-up  HISTORY OF PRESENTING ILLNESS:   Samantha Alexander is a wonderful 32 y.o. female who has been referred to [name] by Dr Francisca December for evaluation and management of iron deficiency. The pt reports that she is doing well overall.  The pt reports that she was first told that she was borderline anemic when she was about 32 years old. She was never given a reason for her anemia. Pt reports that over the last few years her period has gotten heavier. Her periods are typically 5-6 days and are very heavy for about 3 days. Pt has been given PO Ferrous Sulfate but is not very consistent in taking it, typically 2-3 times per week. She simply forgets or chooses not to take them. She has experienced constipation with PO Iron. The last time she took one was about a month ago. Pt has never had an IV Iron infusion but has been offered blood transfusions multiple times, with her last being offered 9 months ago around childbirth. She did not choose to go through with a blood transfusion at that time as she was concerned about the complications. Pt has had one confirmed pregnancy and has one child. Her Hgb was between 8.7-8.3 around the time of birth. Pt has been seeing Nigel Bridgeman, CNM under the supervision of Dr. Su Hilt at [facility] and will see her again next month. She is not currently on birth control and was not given any information on why her periods are heavy. She does not have any dietary restrictions and eats a fairly balanced diet. She is currently being weaned off of Amlodipine, taking a dose about every 3 days. She was placed on Amlodipine due to gestational HTN and  eventually developed pre-eclampsia. They do not think that she has HTN at baseline. Pt breastfed for a month after birth. Her weight has been fluctuating and she denies any unexpected weight loss.   Her fatigue is limiting and keeps her from certain activities. She does note some ice cravings and regularly eats 32 oz of ice at a time. Pt has hemorrhoids but they are not actively bleeding. She denies any bloody/black stools or other bleeding concerns.   Pt's 59 y/o sister has had Leukemia for about 10 years. There is a family history of anemia in women in her family, there are no other known blood/clotting disorders. She also has an aunt with CHF. Pt is a non-smoker, has no history of drug use and does not drink EtOH outside of social situations.   On review of systems, pt reports fatigue and denies abdominal pain, urinary problems, bleeding hemorrhoids, bloody/black stools, other bleeding concerns, unexpected weight loss and any other symptoms.   On PMHx the pt reports gestational HTN, pre-eclampsia, Menorrhagia. On Social Hx the pt reports she is a non-smoker, has no history of drug use and does not drink EtOH outside of social situations On Family Hx the pt reports a sister with Leukemia and an aunt with CHF  INTERVAL HISTORY  Samantha Alexander is a 32 y.o. female who has been referred back to [name] for evaluation and management of iron deficiency anemia.   Patient was seen by [name] in clinic on 06/25/2022  and complained of heavy menstrual bleeding, chronic fatigue, increased dizziness, increased pica symptoms involving ice cravings, and acid reflux.   Today, she reports that she has tolerated her iron infusions well with no major toxicity issues. Patient reports improved energy levels.   She complains of heavy periods. Her menstrual cycles typically las 5-6 days and are heavy for about 3-4 days. She reports that Lysteda does not improve symptoms. Patient reports findings of fibroids several years ago,  but has not had a recent ultrasound.   She is not taking any B12 supplements or oral iron at this time. Patient denies any previous significant toxicity issues with oral iron intake. She denies any dizziness, lightheadedness, or pica symptoms at this time.   MEDICAL HISTORY:  Past Medical History:  Diagnosis Date   Abscess of vulva    Anemia    Headache    HPV (human papilloma virus) anogenital infection    Pregnancy induced hypertension     SURGICAL HISTORY: Past Surgical History:  Procedure Laterality Date   CESAREAN SECTION MULTI-GESTATIONAL N/A 05/10/2021   Procedure: CESAREAN SECTION MULTI-GESTATIONAL;  Surgeon: Osborn Coho, MD;  Location: [facility] LD ORS;  Service: Obstetrics;  Laterality: N/A;   hemorrhoid ligation     WISDOM TOOTH EXTRACTION      SOCIAL HISTORY: Social History   Socioeconomic History   Marital status: Single    Spouse name: Not on file   Number of children: Not on file   Years of education: Not on file   Highest education level: Not on file  Occupational History   Not on file  Tobacco Use   Smoking status: Never   Smokeless tobacco: Never  Vaping Use   Vaping status: Never Used  Substance and Sexual Activity   Alcohol use: Not Currently   Drug use: Never   Sexual activity: Not Currently  Other Topics Concern   Not on file  Social History Narrative   Not on file   Social Determinants of Health   Financial Resource Strain: Not on file  Food Insecurity: Not on file  Transportation Needs: Not on file  Physical Activity: Not on file  Stress: Not on file  Social Connections: Not on file  Intimate Partner Violence: Not on file    FAMILY HISTORY: Family History  Problem Relation Age of Onset   Hypertension Mother    Diabetes Mother    Colon polyps Mother    Hypertension Father    Diabetes Father    Colon cancer Father    Diabetes Sister    Lupus Sister    Diverticulitis Brother        died durning surgery   Heart failure Brother     Heart disease Maternal Grandfather    Colon cancer Paternal Grandmother    Colon cancer Paternal Uncle    Prostate cancer Paternal Uncle    Breast cancer Maternal Aunt    Ovarian cancer Maternal Aunt    Esophageal cancer Maternal Uncle        smoker    ALLERGIES:  is allergic to fentanyl.  MEDICATIONS:  Current Outpatient Medications  Medication Sig Dispense Refill   aspirin 81 MG EC tablet Taking daily prn     Cholecalciferol (VITAMIN D3) 50 MCG (2000 UT) capsule Take 2,000 Units by mouth daily.     FERREX 150 150 MG capsule Take 1 capsule by mouth once daily 30 capsule 0   furosemide (LASIX) 40 MG tablet Take 1 tablet by mouth once a week  12 tablet 1   hydrocortisone (ANUSOL-HC) 25 MG suppository Place 1 suppository (25 mg total) rectally at bedtime as needed for hemorrhoids or anal itching. 14 suppository 1   ibuprofen (ADVIL) 600 MG tablet Take 1 tablet (600 mg total) by mouth every 6 (six) hours. (Patient taking differently: Take 600 mg by mouth every 6 (six) hours as needed for mild pain.) 30 tablet 0   labetalol (NORMODYNE) 100 MG tablet Take 2 tablets (200 mg total) by mouth daily. PATIENT MUST KEEP SCHEDULED APPOINTMENT FOR FUTURE REFILLS 60 tablet 0   NIFEdipine (PROCARDIA XL/NIFEDICAL XL) 60 MG 24 hr tablet Take 1 tablet (60 mg total) by mouth daily. 90 tablet 3   Probiotic Product (PROBIOTIC PO) Take by mouth daily.     No current facility-administered medications for this visit.    REVIEW OF SYSTEMS:    .10 Point review of Systems was done is negative except as noted above.   PHYSICAL EXAMINATION: ECOG PERFORMANCE STATUS: 2 - Symptomatic, <50% confined to bed  . Vitals:   10/26/22 1507  BP: (!) 131/93  Pulse: 77  Resp: 18  Temp: 98.4 F (36.9 C)  SpO2: 100%   Filed Weights   10/26/22 1507  Weight: 223 lb (101.2 kg)  .Body mass index is 33.91 kg/m. Marland Kitchen GENERAL:alert, in no acute distress and comfortable SKIN: no acute rashes, no significant  lesions EYES: conjunctiva are pink and non-injected, sclera anicteric OROPHARYNX: MMM, no exudates, no oropharyngeal erythema or ulceration NECK: supple, no JVD LYMPH:  no palpable lymphadenopathy in the cervical, axillary or inguinal regions LUNGS: clear to auscultation b/l with normal respiratory effort HEART: regular rate & rhythm ABDOMEN:  normoactive bowel sounds , non tender, gravid uterus Extremity: 1+ bilateral pedal edema PSYCH: alert & oriented x 3 with fluent speech NEURO: no focal motor/sensory deficits   LABORATORY DATA:  I have reviewed the data as listed  .    Latest Ref Rng & Units 10/26/2022    2:49 PM 06/25/2022   11:31 AM 05/11/2022    9:13 AM  CBC  WBC 4.0 - 10.5 K/uL 8.1  6.7  6.2   Hemoglobin 12.0 - 15.0 g/dL 16.1  9.2  09.6   Hematocrit 36.0 - 46.0 % 38.6  30.4  32.0   Platelets 150 - 400 K/uL 313  333  352.0     .    Latest Ref Rng & Units 10/26/2022    2:49 PM 06/25/2022   11:31 AM 05/11/2022    9:13 AM  CMP  Glucose 70 - 99 mg/dL 88  97  81   BUN 6 - 20 mg/dL 12  12  14    Creatinine 0.44 - 1.00 mg/dL 0.45  4.09  8.11   Sodium 135 - 145 mmol/L 140  140  140   Potassium 3.5 - 5.1 mmol/L 4.1  3.9  4.0   Chloride 98 - 111 mmol/L 106  107  105   CO2 22 - 32 mmol/L 29  28  27    Calcium 8.9 - 10.3 mg/dL 9.3  9.7  9.6   Total Protein 6.5 - 8.1 g/dL 7.4  7.4  6.9   Total Bilirubin 0.3 - 1.2 mg/dL 0.2  0.3  0.4   Alkaline Phos 38 - 126 U/L 89  97  93   AST 15 - 41 U/L 10  12  14    ALT 0 - 44 U/L 12  9  12     . Lab Results  Component  Value Date   IRON 79 10/26/2022   TIBC 290 10/26/2022   IRONPCTSAT 27 10/26/2022   (Iron and TIBC)  Lab Results  Component Value Date   FERRITIN 208 10/26/2022   B12 - 267    RADIOGRAPHIC STUDIES: I have personally reviewed the radiological images as listed and agreed with the findings in the report. No results found.  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 CBC showed WBC of 8.1K, hemoglobin of 11.7, and platelets of 313K. -hemoglobin has improved. Hgb level was previously 9.2 in April 2024, and is currently 11.7 -patient received at total of  1.5g iron with her last 2 infusions -other labs including vitamin B12 - wnl at 455 =ferritin 208 and iron saturation of 27% -No indication for additional IV Iron at this time. -discussed option of oral maintenance to delay the need for additional IV iron or receiving additional iron infusions to build up iron stores depending on lab work -advised patient to connect with OB/GYN to discuss potential treatment options to address her heavy menstrual cycles. Options may include a Mirena IUD or birth control tablets -discussed that if patient is planning to have more children, this would need to be considered prior to considering certain treatment options such as hysterectomy or endometrial ablation -recommend OTC B-complex supplements to take 3 days a week to improve energy levels and blood counts  FOLLOW-UP: RTC with Dr Candise Che with labs in 6 months  The total time spent in the appointment was 20 minutes* .  All of the patient's questions were answered with apparent satisfaction. The patient knows to call the clinic with any problems, questions or concerns.   Wyvonnia Lora MD MS AAHIVMS [facility] Hematology/Oncology Physician [facility]  .*Total Encounter Time as defined by the Centers for Medicare and Medicaid Services includes, in addition to the face-to-face time of a patient visit (documented in the note above)  non-face-to-face time: obtaining and reviewing outside history, ordering and reviewing medications, tests or procedures, care coordination (communications with other health care professionals or caregivers) and documentation in the medical record.    I,Mitra Faeizi,acting as a Neurosurgeon for Wyvonnia Lora, MD.,have documented all relevant documentation on the behalf of Wyvonnia Lora, MD,as directed by  Wyvonnia Lora, MD while in the presence of Wyvonnia Lora, MD.  .I have reviewed the above documentation for accuracy and completeness, and I agree with the above. Johney Maine MD

## 2022-11-01 ENCOUNTER — Encounter: Payer: Self-pay | Admitting: Hematology

## 2022-11-01 MED ORDER — POLYSACCHARIDE IRON COMPLEX 150 MG PO CAPS: 150.0000 mg | ORAL_CAPSULE | Freq: Every day | ORAL | 11 refills | Status: AC

## 2022-11-04 DIAGNOSIS — Z419 Encounter for procedure for purposes other than remedying health state, unspecified: Secondary | ICD-10-CM | POA: Diagnosis not present

## 2022-11-20 IMAGING — US US MFM OB DETAIL EACH ADDL GEST+14 WK
1 series · 13 of 28 positions shown · non-contrast
Comparison: none

[Series 1: us mfm ob detail each addl gest+14 wk · 13 of 123 slices shown]
[im 5/123]
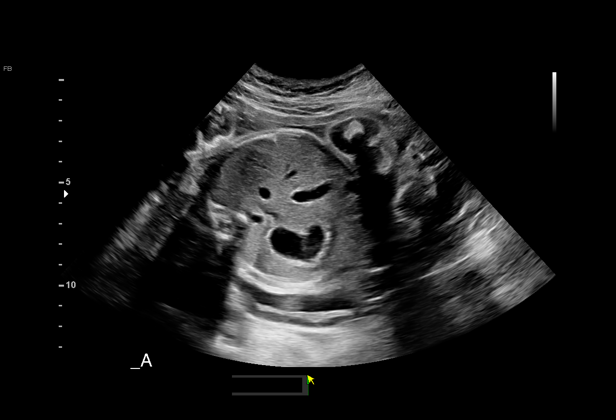
[im 14/123]
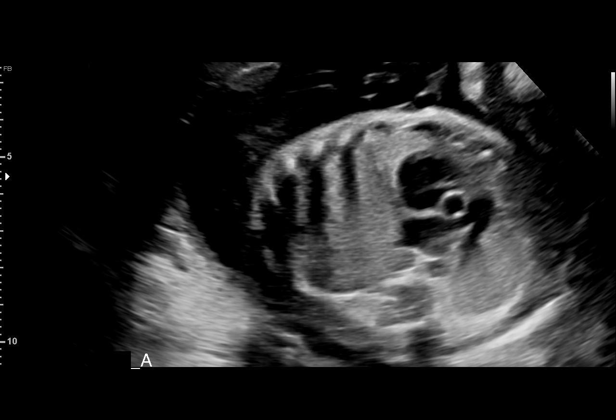
[im 23/123]
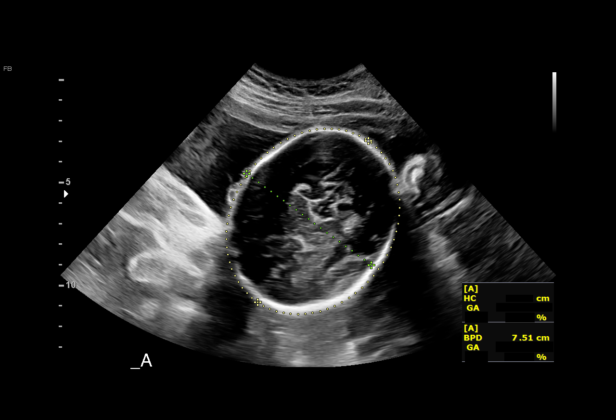
[im 32/123]
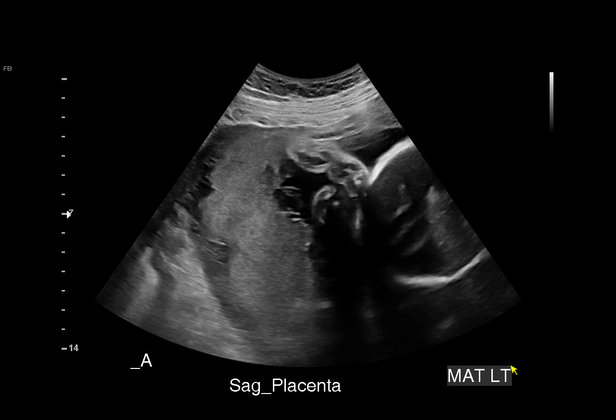
[im 41/123]
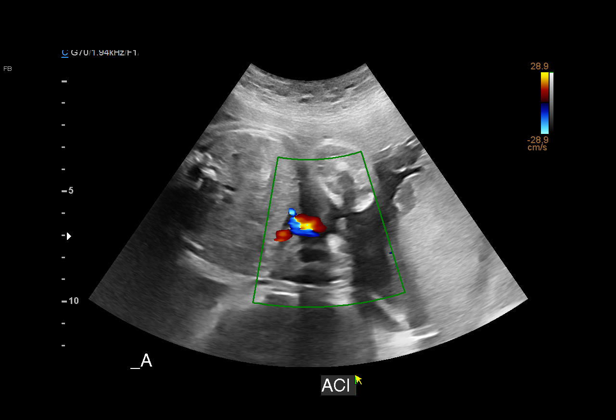
[im 50/123]
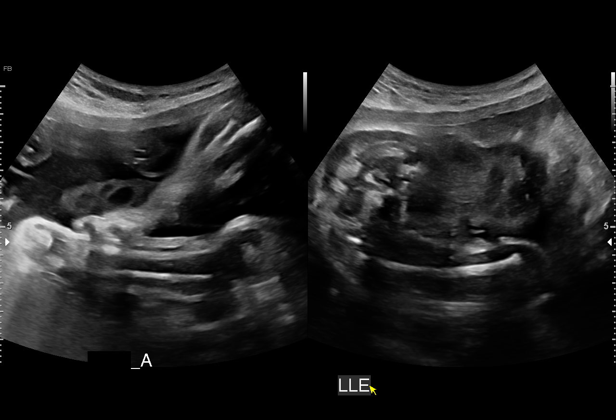
[im 64/123]
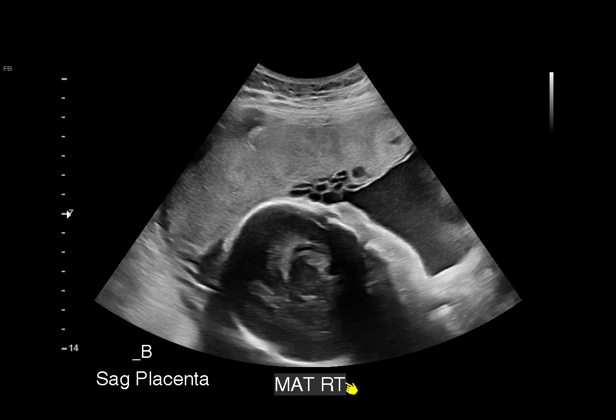
[im 73/123]
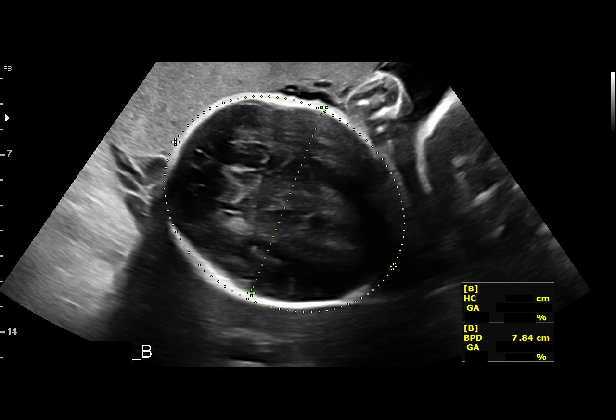
[im 82/123]
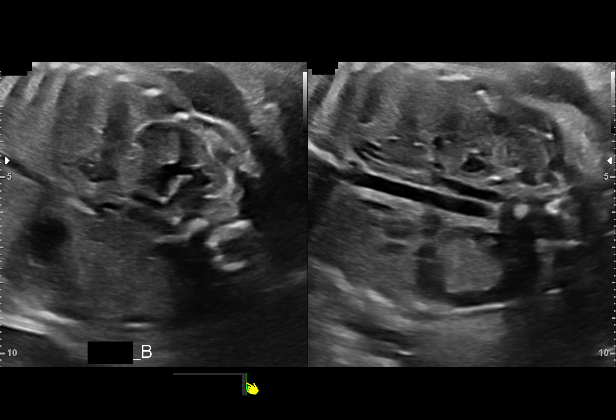
[im 91/123]
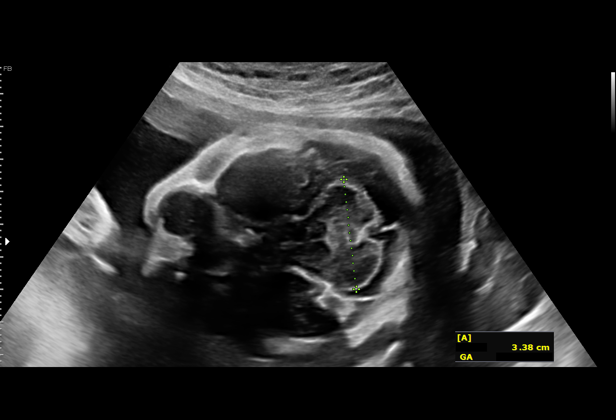
[im 100/123]
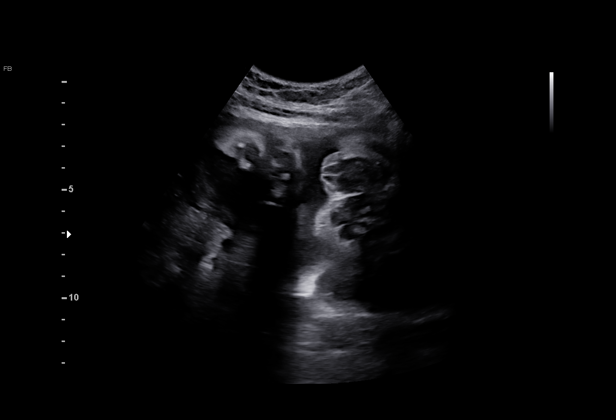
[im 109/123]
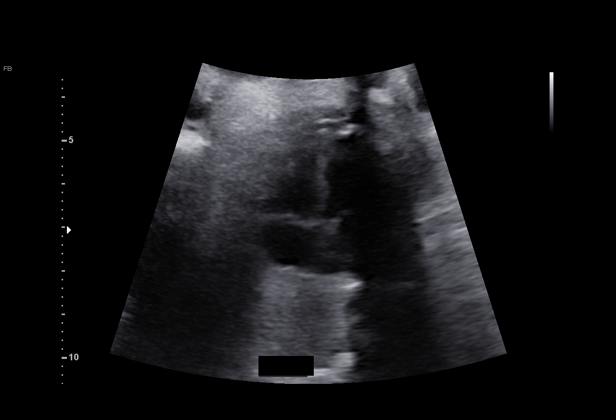
[im 118/123]
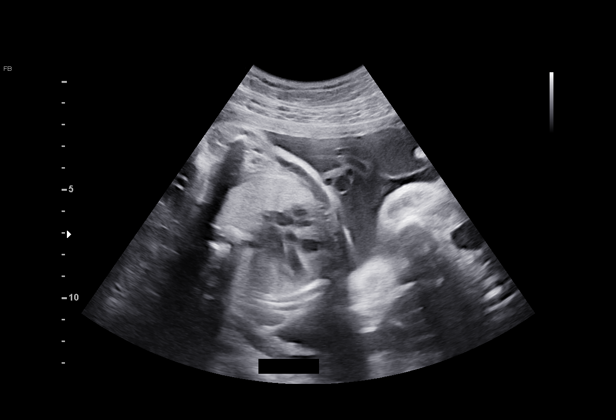

[13 of 28 positions shown; findings below may reference images not displayed]

Obstetrics &
                                                            Gynecology
                                                            [address]
                                                            [place]
                   CNM

 1  US MFM OB DETAIL ADDL GEST            76811.02    [facility]
    +14 WK
 3  US MFM UA CORD DOPPLER                76820.02    [facility]
 4  US MFM UA ADDL GEST                   76820.01    [facility]

Indications

 Hypertension - Chronic/Pre-existing
 (labetolol)
 Obesity complicating pregnancy, third
 trimester
 Poor obstetric history: Previous
 preeclampsia / eclampsia/gestational HTN
 Anemia during pregnancy in third trimester
 (IV Iron infusion)
 Twin pregnancy, di/di, third trimester
 29 weeks gestation of pregnancy
Fetal Evaluation (Fetus A)

 Num Of Fetuses:         2
 Fetal Heart Rate(bpm):  133
 Cardiac Activity:       Observed
 Fetal Lie:              Maternal right side
 Presentation:           Breech
 Placenta:               Right lateral
 P. Cord Insertion:      Visualized, central
 Membrane Desc:      Dividing Membrane seen - Dichorionic.

 Amniotic Fluid
 AFI FV:      Within normal limits

                             Largest Pocket(cm)

Biometry (Fetus A)

 BPD:      79.9  mm     G. Age:  32w 1d         93  %    CI:        77.69   %    70 - 86
                                                         FL/HC:      18.7   %    19.2 -
 HC:      286.9  mm     G. Age:  31w 4d         65  %    HC/AC:      1.07        0.99 -
 AC:      267.1  mm     G. Age:  30w 6d         73  %    FL/BPD:     67.1   %    71 - 87
 FL:       53.6  mm     G. Age:  28w 3d          7  %    FL/AC:      20.1   %    20 - 24
 CER:      3.52  mm     G. Age:  N/A         < 2.3  %

 LV:       0.57  mm

 Est. FW:    2170  gm      3 lb 6 oz     50  %     FW Discordancy     0 \ 17 %
OB History

 Blood Type:   O+
 Gravidity:    2         Term:   1        Prem:   0        SAB:   0
 TOP:          0       Ectopic:  0        Living: 1
Gestational Age (Fetus A)

 LMP:           30w 2d        Date:  08/28/20                 EDD:   06/04/21
 U/S Today:     30w 5d                                        EDD:   06/01/21
 Best:          29w 6d     Det. By:  Early Ultrasound         EDD:   06/07/21
                                     (11/03/20)
Anatomy (Fetus A)

 Cranium:               Appears normal         Aortic Arch:            Not well visualized
 Cavum:                 Not well visualized    Ductal Arch:            Not well visualized
 Ventricles:            Appears normal         Diaphragm:              Appears normal
 Choroid Plexus:        Not well visualized    Stomach:                Appears normal, left
                                                                       sided
 Cerebellum:            Appears normal         Abdomen:                Appears normal
 Posterior Fossa:       Appears normal         Abdominal Wall:         Appears nml (cord
                                                                       insert, abd wall)
 Face:                  Not well visualized    Cord Vessels:           Appears normal (3
                                                                       vessel cord)
 Lips:                  Not well visualized    Kidneys:                Appear normal
 Thoracic:              Appears normal         Bladder:                Appears normal
 Heart:                 Not well visualized    Spine:                  Not well visualized
 RVOT:                  Not well visualized    Upper Extremities:      Visualized
 LVOT:                  Not well visualized    Lower Extremities:      Visualized

 Other:  Fetus appears to be female. Technically difficult due to advanced GA
         and fetal position.
Doppler - Fetal Vessels (Fetus A)
 Umbilical Artery
  S/D     %tile                                              ADFV    RDFV
  3.88       91                                                 No      No

Fetal Evaluation (Fetus B)

 Num Of Fetuses:         2
 Fetal Heart Rate(bpm):  135
 Cardiac Activity:       Observed
 Fetal Lie:              Maternal left side
 Presentation:           Cephalic
 Placenta:               Posterior
 P. Cord Insertion:      Visualized, central
 Membrane Desc:      Dividing Membrane seen - Dichorionic.

 Amniotic Fluid
 AFI FV:      Within normal limits

                             Largest Pocket(cm)

Biometry (Fetus B)

 BPD:      75.6  mm     G. Age:  30w 2d         53  %    CI:        76.82   %    70 - 86
                                                         FL/HC:      19.4   %    19.2 -
 HC:      273.2  mm     G. Age:  29w 6d         16  %    HC/AC:      1.12        0.99 -
 AC:      243.6  mm     G. Age:  28w 4d         13  %    FL/BPD:     70.0   %    71 - 87
 FL:       52.9  mm     G. Age:  28w 1d        4.2  %    FL/AC:      21.7   %    20 - 24
 CER:      3.38  mm     G. Age:  N/A         < 2.3  %

 LV:       0.57  mm

 Est. FW:    3884  gm    2 lb 13 oz       9  %     FW Discordancy        17  %
Gestational Age (Fetus B)

 LMP:           30w 2d        Date:  08/28/20                 EDD:   06/04/21
 U/S Today:     29w 2d                                        EDD:   06/11/21
 Best:          29w 6d     Det. By:  Early Ultrasound         EDD:   06/07/21
                                     (11/03/20)
Anatomy (Fetus B)

 Cranium:               Appears normal         Aortic Arch:            Not well visualized
 Cavum:                 Not well visualized    Ductal Arch:            Appears normal
 Ventricles:            Appears normal         Diaphragm:              Appears normal
 Choroid Plexus:        Not well visualized    Stomach:                Appears normal, left
                                                                       sided
 Cerebellum:            Appears normal         Abdomen:                Appears normal
 Posterior Fossa:       Appears normal         Abdominal Wall:         Appears nml (cord
                                                                       insert, abd wall)
 Face:                  Not well visualized    Cord Vessels:           Appears normal (3
                                                                       vessel cord)
 Lips:                  Not well visualized    Kidneys:                Appear normal
 Thoracic:              Appears normal         Bladder:                Appears normal
 Heart:                 Appears normal         Spine:                  Not well visualized
                        (4CH, axis, and
                        situs)
 RVOT:                  Appears normal         Upper Extremities:      Visualized
 LVOT:                  Appears normal         Lower Extremities:      Visualized

 Other:  Fetus appears to be a male. Technically difficult due to advanced GA
         and fetal position.
Doppler - Fetal Vessels (Fetus B)

 Umbilical Artery
  S/D     %tile                                              ADFV    RDFV
  2.95       56                                                 No      No

Cervix Uterus Adnexa

 Cervix
 Not visualized (advanced GA >22wks)
Comments

 Anayet Mirja was seen for an ultrasound exam and
 consultation due to fetal growth discordance noted between
 the two fetuses in a spontaneously conceived dichorionic,
 diamniotic twin gestation.  She denies any other problems in
 her current pregnancy and reports feeling vigorous fetal
 movements of both fetuses throughout the day.  She also has
 a history of chronic hypertension that is currently treated with
 labetalol.
 She has declined all screening tests for fetal aneuploidy in
 her current pregnancy.
 Twin A: EFW 3 pounds 6 ounces (50th percentile for her
 gestational age). Normal amniotic fluid.  Doppler studies of
 the umbilical arteries showed continued normal forward flow.
 There were no signs of absent or reversed end-diastolic flow
 noted.
 Twin B: EFW 2 pounds 13 ounces (9th percentile for her
 gestational age, indicating IUGR).  Normal amniotic fluid.
 Doppler studies of the umbilical arteries showed continued
 normal forward flow.  There were no signs of absent or
 reversed end-diastolic flow noted.
 The growth discordance between the two fetuses was 17%.
 Fetal movements of both twin A and twin B were noted
 throughout today's exam.
 The implications and management of fetal growth restriction
 was discussed with the patient.  She was advised regarding
 the increased risk of an adverse pregnancy outcome such as
 stillbirth associated with IUGR, especially if the umbilical
 artery Doppler studies are abnormal.
 She was reassured that fetal growth restriction is a common
 finding and that most cases result in the delivery of a healthy
 infant close to term.
 Due to fetal growth restriction in a dichorionic twin gestation,
 we will continue to follow her with weekly biophysical profiles
 and umbilical artery Doppler studies.  We will reassess the
 fetal growth again in 3 weeks.
 As long as the weekly fetal testing and umbilical artery
 Doppler studies are reassuring, delivery should probably
 occur at between 36 to 37 weeks.
 A biophysical profile and umbilical artery Doppler study was
 scheduled in our office in 1 week.

 She was advised to continue taking labetalol for blood
 pressure control for the remainder of her pregnancy.

 At the end of the consultation, the patient stated that all of her
 questions have been answered to her complete satisfaction.

 A total of 30 minutes was spent counseling and coordinating
 the care for this patient.  Greater than 50% of the time was
 spent in direct face-to-face contact.
Recommendations

 Weekly fetal testing and umbilical artery Doppler studies
 Continue labetalol for blood pressure control
 Delivery at between 36 to 37 weeks should IUGR continue to
 be noted in twin B

## 2022-11-22 DIAGNOSIS — Z0000 Encounter for general adult medical examination without abnormal findings: Secondary | ICD-10-CM | POA: Diagnosis not present

## 2022-11-22 DIAGNOSIS — Z01419 Encounter for gynecological examination (general) (routine) without abnormal findings: Secondary | ICD-10-CM | POA: Diagnosis not present

## 2022-11-22 DIAGNOSIS — Z113 Encounter for screening for infections with a predominantly sexual mode of transmission: Secondary | ICD-10-CM | POA: Diagnosis not present

## 2022-11-29 ENCOUNTER — Encounter: Payer: Self-pay | Admitting: Hematology

## 2022-11-29 ENCOUNTER — Ambulatory Visit (HOSPITAL_COMMUNITY)
Admission: RE | Admit: 2022-11-29 | Discharge: 2022-11-29 | Disposition: A | Discharge status: Home / Self Care | Payer: Medicaid Other | Source: Ambulatory Visit | Attending: Nurse Practitioner | Admitting: Nurse Practitioner

## 2022-11-29 VITALS — BP 128/90 | HR 83 | Temp 98.0°F | Resp 16

## 2022-11-29 DIAGNOSIS — Z1152 Encounter for screening for COVID-19: Secondary | ICD-10-CM | POA: Diagnosis not present

## 2022-11-29 DIAGNOSIS — J069 Acute upper respiratory infection, unspecified: Secondary | ICD-10-CM | POA: Diagnosis not present

## 2022-11-29 LAB — POCT INFLUENZA A/B
Influenza A, POC: NEGATIVE
Influenza B, POC: NEGATIVE

## 2022-11-29 IMAGING — US US MFM UA CORD DOPPLER
1 series · 12 of 28 positions shown · non-contrast
Comparison: none

[Series 1: us mfm ua cord doppler · 117 acquisitions, 12 frames shown]
[im 5/117]
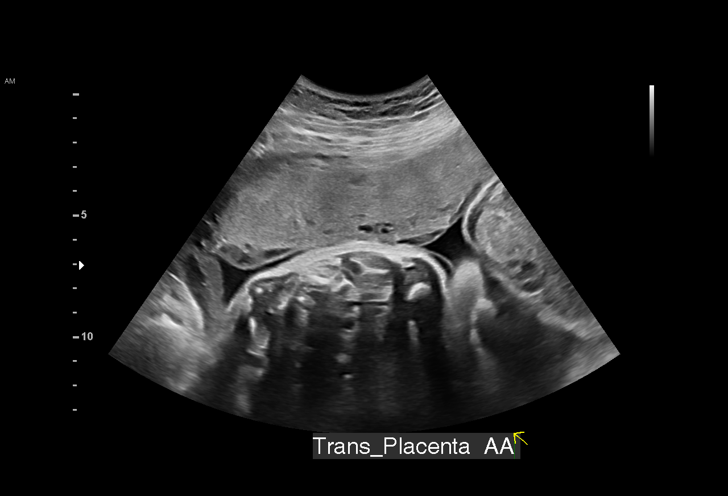
[im 13/117]
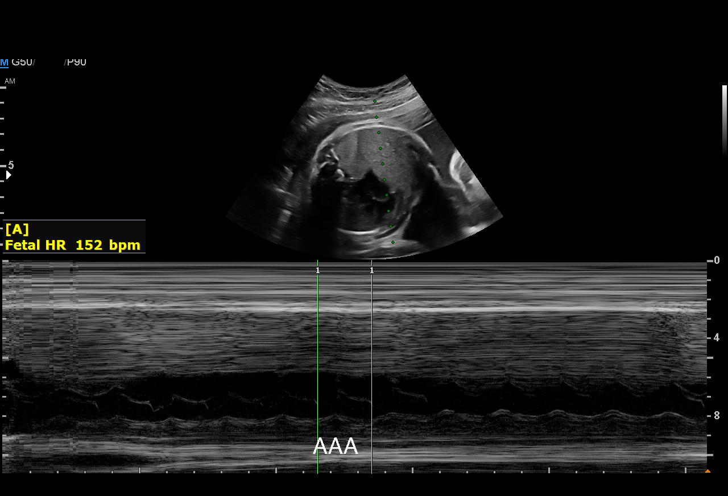
[im 22/117]
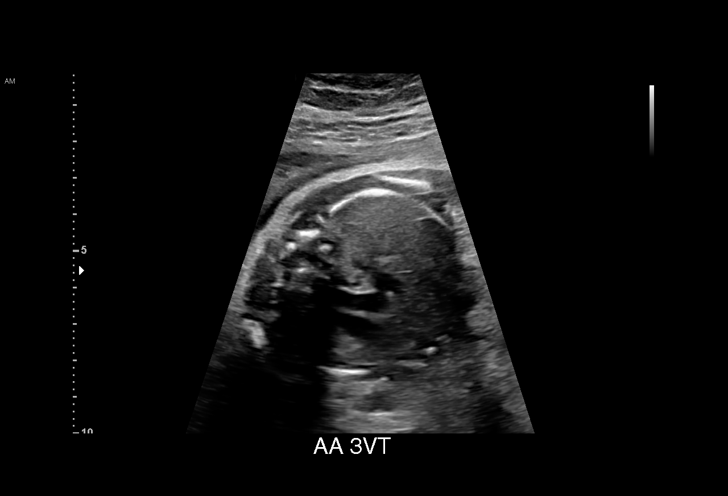
[im 35/117]
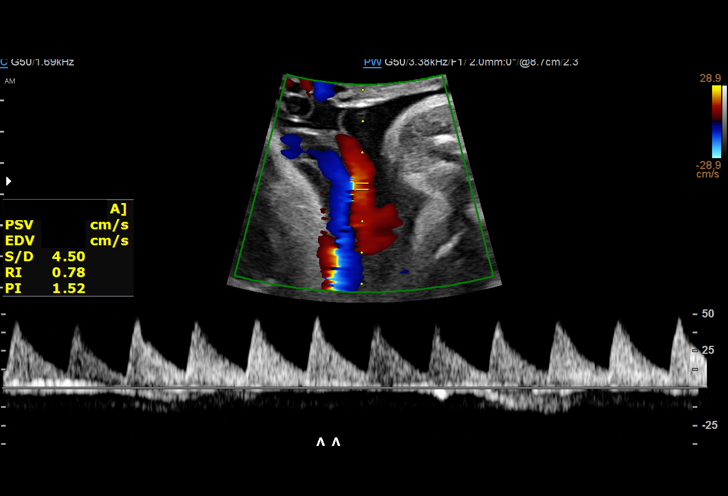
[im 43/117]
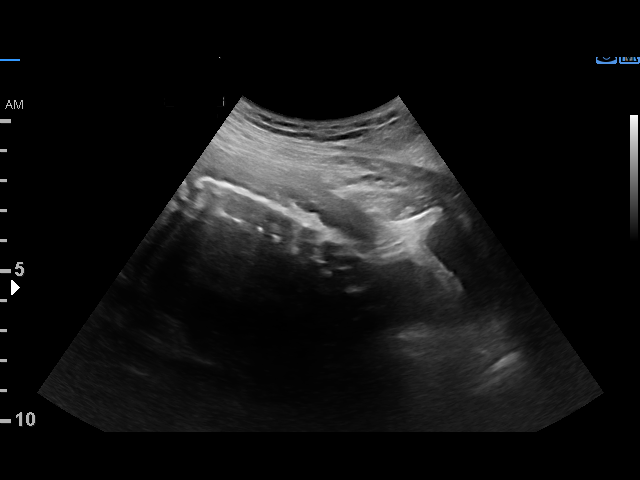
[im 52/117]
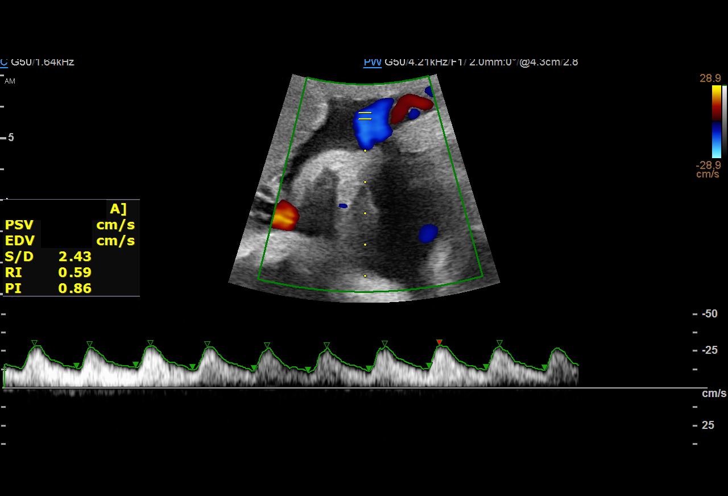
[im 65/117]
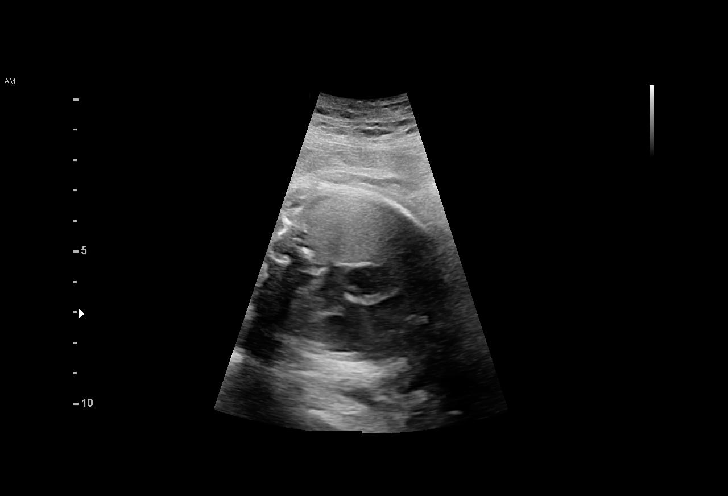
[im 74/117]
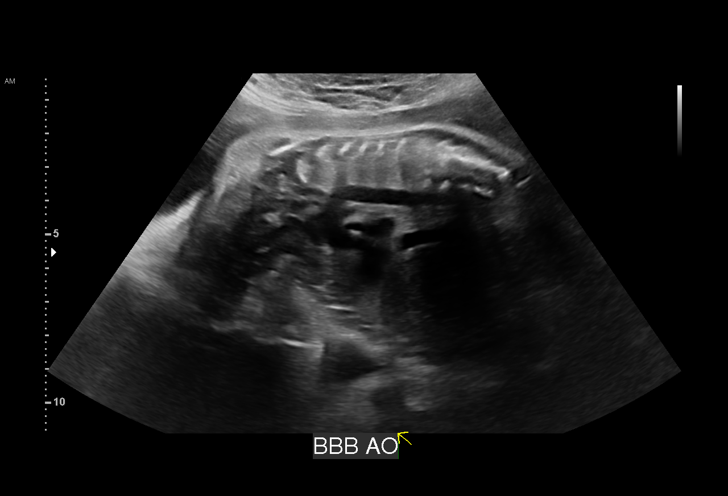
[im 82/117]
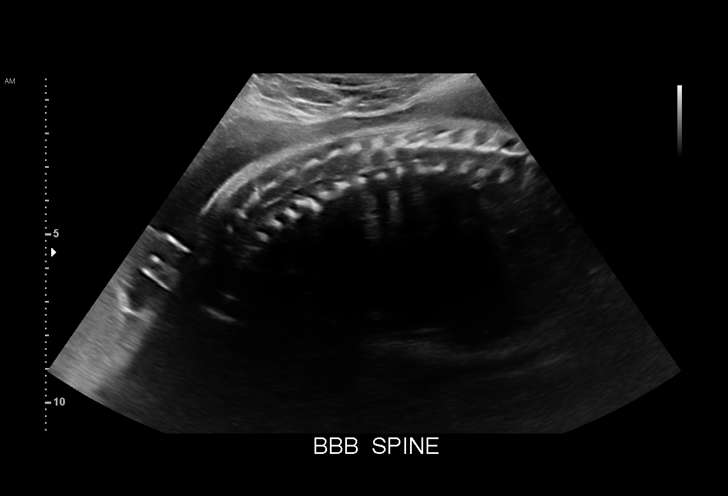
[im 95/117]
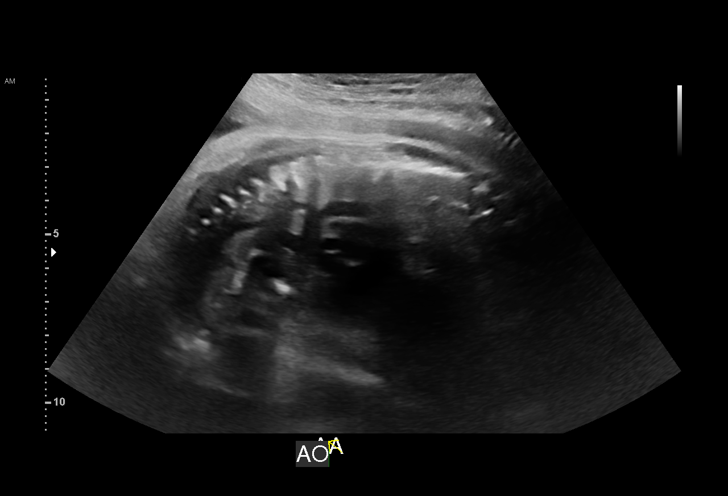
[im 104/117]
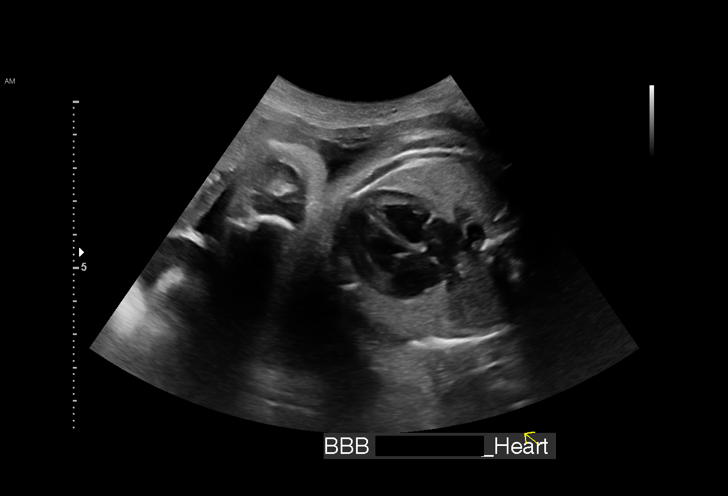
[im 112/117]
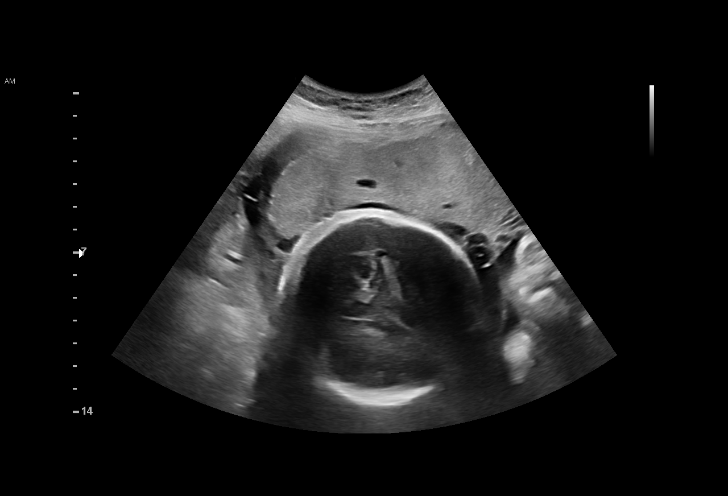

[12 of 28 positions shown; findings below may reference images not displayed]

Obstetrics &
                                                            Gynecology
                                                            [id number]
                                                            Kristinn Helgi.
                   CNM

    ADDL GESTATION
    GEST RE EVAL

Indications

 31 weeks gestation of pregnancy
 Hypertension - Chronic/Pre-existing
 (labetolol)
 Obesity complicating pregnancy, third
 trimester
 Poor obstetric history: Previous
 preeclampsia / eclampsia/gestational HTN
 Anemia during pregnancy in third trimester
 (IV Iron infusion)
 Twin pregnancy, di/di, third trimester
Fetal Evaluation (Fetus A)
 Num Of Fetuses:         2
 Fetal Heart Rate(bpm):  152
 Cardiac Activity:       Observed
 Presentation:           Breech Maternal Right
 Placenta:               Anterior right
 P. Cord Insertion:      Previously Visualized
 Membrane Desc:      Dividing Membrane seen - Dichorionic.

 Amniotic Fluid
 AFI FV:      Within normal limits

                             Largest Pocket(cm)

Biophysical Evaluation (Fetus A)

 Amniotic F.V:   Within normal limits       F. Tone:        Observed
 F. Movement:    Observed                   Score:          [DATE]
 F. Breathing:   Observed
Biometry (Fetus A)

 LV:        4.3  mm
OB History

 Blood Type:   O+
 Gravidity:    2         Term:   1        Prem:   0        SAB:   0
 TOP:          0       Ectopic:  0        Living: 1
Gestational Age (Fetus A)

 LMP:           31w 4d        Date:  08/28/20                 EDD:   06/04/21
 Best:          31w 1d     Det. By:  Early Ultrasound         EDD:   06/07/21
                                     (11/03/20)
Anatomy (Fetus A)

 Cranium:               Appears normal         Aortic Arch:            Not well visualized
 Cavum:                 Appears normal         Ductal Arch:            Appears normal
 Ventricles:            Appears normal         Diaphragm:              Appears normal
 Choroid Plexus:        Not well visualized    Stomach:                Appears normal, left
                                                                       sided
 Cerebellum:            Previously seen        Abdomen:                Appears normal
 Posterior Fossa:       Previously seen        Abdominal Wall:         Previously seen
 Nuchal Fold:           Not applicable (>20    Cord Vessels:           Appears normal (3
                        wks GA)                                        vessel cord)
 Face:                  Not well visualized    Kidneys:                Appear normal
 Lips:                  Not well visualized    Bladder:                Appears normal
 Thoracic:              Appears normal         Spine:                  Not well visualized
 Heart:                 Appears normal         Upper Extremities:      Visualized
                        (4CH, axis, and
                        situs)
 RVOT:                  Appears normal         Lower Extremities:      Visualized
 LVOT:                  Appears normal

 Other:  Fetus appears to be female. Technically difficult due to advanced GA
         and fetal position.
Doppler - Fetal Vessels (Fetus A)

 Umbilical Artery
  S/D     %tile      RI    %tile      PI    %tile            ADFV    RDFV
  3.42       82    0.71       84    1.24       93               No      No

Fetal Evaluation (Fetus B)

 Num Of Fetuses:         2
 Fetal Heart Rate(bpm):  144
 Cardiac Activity:       Observed
 Presentation:           Breech Maternal Left
 Placenta:               Left Lateral
 P. Cord Insertion:      Previously Visualized
 Membrane Desc:      Dividing Membrane seen - Dichorionic.

 Amniotic Fluid
 AFI FV:      Within normal limits

                             Largest Pocket(cm)

Biophysical Evaluation (Fetus B)

 Amniotic F.V:   Within normal limits       F. Tone:        Observed
 F. Movement:    Observed                   Score:          [DATE]
 F. Breathing:   Observed
Biometry (Fetus B)

 LV:          6  mm
Gestational Age (Fetus B)

 LMP:           31w 4d        Date:  08/28/20                 EDD:   06/04/21
 Best:          31w 1d     Det. By:  Early Ultrasound         EDD:   06/07/21
                                     (11/03/20)
Anatomy (Fetus B)

 Cranium:               Appears normal         Aortic Arch:            Appears normal
 Cavum:                 Appears normal         Ductal Arch:            Previously seen
 Ventricles:            Appears normal         Diaphragm:              Appears normal
 Choroid Plexus:        Not well visualized    Stomach:                Appears normal, left
                                                                       sided
 Cerebellum:            Appears normal         Abdomen:                Appears normal
 Posterior Fossa:       Appears normal         Abdominal Wall:         Previously seen
 Face:                  Orbits nl; profile not Cord Vessels:           Previously seen
                        well visualized
 Lips:                  Appears normal         Kidneys:                Appear normal
 Thoracic:              Appears normal         Bladder:                Appears normal
 Heart:                 Appears normal         Spine:                  Appears normal
                        (4CH, axis, and
                        situs)
 RVOT:                  Appears normal         Upper Extremities:      Visualized
 LVOT:                  Appears normal         Lower Extremities:      Visualized
 Other:  Fetus previously appears to be a male. Technically difficult due to
         advanced GA and fetal position.
Doppler - Fetal Vessels (Fetus B)

 Umbilical Artery
  S/D     %tile      RI    %tile      PI    %tile            ADFV    RDFV
  2.63       43    0.62       46    0.96       57               No      No

Impression

 Diamniotic Dichorionic twin pregnancy antenatal testing due
 to FGR in twin B with an EFW 9th%.

 There is good amniotic fluid and movement in both twins with
 notable full stomachs and bladders.

 Biophysical profile of [DATE] with were seen in Twin A and B.
 UA Dopplers are normal with no evidence of AEDF or REDF
 in either twin as well.

 Suboptimal views of the fetal anatomy was again observed as
 documented above.
Recommendations

 Continue weekly testing with UA Dopplers
 Repeat growth is scheduled in 3 weeks
 Plan for delivery ^ 37 weeks if FGR does not resolve.

## 2022-11-29 MED ORDER — BENZONATATE 100 MG PO CAPS: 100.0000 mg | ORAL_CAPSULE | Freq: Three times a day (TID) | ORAL | 0 refills | Status: DC | PRN

## 2022-11-29 NOTE — ED Triage Notes (Signed)
Pt began feeling unwell Tuesday night. She has congestion, body aches, sore throat, and chills.

## 2022-11-29 NOTE — Discharge Instructions (Addendum)
You have a viral upper respiratory infection.  Symptoms should improve over the next week to 10 days.  If you develop chest pain or shortness of breath, go to the emergency room.  We have tested you today for COVID-19.  Influenza test is negative today.  You will see the results in Mychart and we will contact you with positive results.  Please stay home and isolate until you are fever free for 24 hours without fever reducing medication.    Some things that can make you feel better are: - Increased rest - Increasing fluid with water/sugar free electrolytes - Acetaminophen and ibuprofen as needed for fever/pain - Salt water gargling, chloraseptic spray and throat lozenges - OTC guaifenesin (Mucinex) 600 mg twice daily - Saline sinus flushes or a neti pot - Humidifying the air -Tessalon Perles every 8 hours as needed for dry cough

## 2022-11-29 NOTE — ED Provider Notes (Signed)
[facility]    CSN: [id number] Arrival date & time: 11/29/22  1520      History   Chief Complaint Chief Complaint  Patient presents with   Nasal Congestion    Entered by patient   Sore Throat   Chills    HPI Samantha Alexander is a 32 y.o. female.   Patient presents today with 2-day history of bodyaches and chills, cough that has not improved, shortness of breath, runny and stuffy nose, sore throat, sinus pressure initially that is also now improved, decreased appetite, and fatigue.  She denies known fevers at home, chest pain or tightness, chest congestion, headache, ear pain, abdominal pain, nausea/vomiting, or diarrhea.  Reports that she has 78-month-old twins at home and they are sick with similar symptoms, there symptoms started first.  She has taken Alka-Seltzer plus with improvement in symptoms.  She is requesting influenza and COVID-19 testing today.    Past Medical History:  Diagnosis Date   Abscess of vulva    Anemia    Headache    HPV (human papilloma virus) anogenital infection    Pregnancy induced hypertension     Patient Active Problem List   Diagnosis Date Noted   IDA (iron deficiency anemia) 06/25/2022   Rectal bleeding 11/10/2021   Family history of colon cancer in father 11/10/2021   Postpartum hypertension 10/11/2021   Acute blood loss anemia 05/12/2021   Chronic peripheral venous hypertension 05/10/2021   Twin gestation, dichorionic diamniotic 05/10/2021   Discordant fetal growth in twin gestation 05/10/2021   Breech presentation 05/10/2021   Postpartum care following cesarean delivery 05/10/2021   Obesity in pregnancy 04/27/2021   Chronic hypertension in pregnancy 02/15/2021   History of pre-eclampsia 02/15/2021    Past Surgical History:  Procedure Laterality Date   CESAREAN SECTION MULTI-GESTATIONAL N/A 05/10/2021   Procedure: CESAREAN SECTION MULTI-GESTATIONAL;  Surgeon: Osborn Coho, MD;  Location: [facility];  Service:  Obstetrics;  Laterality: N/A;   hemorrhoid ligation     WISDOM TOOTH EXTRACTION      OB History     Gravida  2   Para  2   Term  1   Preterm  1   AB      Living  3      SAB      IAB      Ectopic      Multiple  1   Live Births  3            Home Medications    Prior to Admission medications   Medication Sig Start Date End Date Taking? Authorizing Provider  benzonatate (TESSALON) 100 MG capsule Take 1 capsule (100 mg total) by mouth 3 (three) times daily as needed for cough. Do not take with alcohol or while driving or operating heavy machinery.  May cause drowsiness. 11/29/22  Yes Valentino Nose, NP  aspirin 81 MG EC tablet Taking daily prn 05/29/21   [provider]  Cholecalciferol (VITAMIN D3) 50 MCG (2000 UT) capsule Take 2,000 Units by mouth daily. 04/18/21   [provider]  furosemide (LASIX) 40 MG tablet Take 1 tablet by mouth once a week 07/02/22   Tobb, Kardie, DO  hydrocortisone (ANUSOL-HC) 25 MG suppository Place 1 suppository (25 mg total) rectally at bedtime as needed for hemorrhoids or anal itching. Patient not taking: Reported on 11/29/2022 11/10/21   Zehr, Princella Pellegrini, PA-C  ibuprofen (ADVIL) 600 MG tablet Take 1 tablet (600 mg total) by mouth  every 6 (six) hours. Patient taking differently: Take 600 mg by mouth every 6 (six) hours as needed for mild pain. 05/13/21   Dale [place], FNP  iron polysaccharides (FERREX 150) 150 MG capsule Take 1 capsule (150 mg total) by mouth daily. 11/01/22   Johney Maine, MD  labetalol (NORMODYNE) 100 MG tablet Take 2 tablets (200 mg total) by mouth daily. PATIENT MUST KEEP SCHEDULED APPOINTMENT FOR FUTURE REFILLS 10/15/22   Tobb, Kardie, DO  NIFEdipine (PROCARDIA XL/NIFEDICAL XL) 60 MG 24 hr tablet Take 1 tablet (60 mg total) by mouth daily. 06/14/21   Tobb, Kardie, DO  Probiotic Product (PROBIOTIC PO) Take by mouth daily.    [provider]    Family History Family History  Problem  Relation Age of Onset   Hypertension Mother    Diabetes Mother    Colon polyps Mother    Hypertension Father    Diabetes Father    Colon cancer Father    Diabetes Sister    Lupus Sister    Diverticulitis Brother        died durning surgery   Heart failure Brother    Heart disease Maternal Grandfather    Colon cancer Paternal Grandmother    Colon cancer Paternal Uncle    Prostate cancer Paternal Uncle    Breast cancer Maternal Aunt    Ovarian cancer Maternal Aunt    Esophageal cancer Maternal Uncle        smoker    Social History Social History   Tobacco Use   Smoking status: Never   Smokeless tobacco: Never  Vaping Use   Vaping status: Never Used  Substance Use Topics   Alcohol use: Not Currently   Drug use: Never     Allergies   Fentanyl   Review of Systems Review of Systems Per HPI  Physical Exam Triage Vital Signs ED Triage Vitals  Encounter Vitals Group     BP 11/29/22 1529 (!) 128/90     Systolic BP Percentile --      Diastolic BP Percentile --      Pulse Rate 11/29/22 1529 83     Resp 11/29/22 1529 16     Temp 11/29/22 1529 98 F (36.7 C)     Temp src --      SpO2 11/29/22 1529 97 %     Weight --      Height --      Head Circumference --      Peak Flow --      Pain Score 11/29/22 1527 2     Pain Loc --      Pain Education --      Exclude from Growth Chart --    No data found.  Updated Vital Signs BP (!) 128/90 (BP Location: Left Arm)   Pulse 83   Temp 98 F (36.7 C)   Resp 16   LMP 11/07/2022 (Approximate)   SpO2 97%   Visual Acuity Right Eye Distance:   Left Eye Distance:   Bilateral Distance:    Right Eye Near:   Left Eye Near:    Bilateral Near:     Physical Exam Vitals and nursing note reviewed.  Constitutional:      General: She is not in acute distress.    Appearance: Normal appearance. She is not ill-appearing or toxic-appearing.  HENT:     Head: Normocephalic and atraumatic.     Right Ear: Tympanic membrane,  ear canal and external ear normal. No  drainage, swelling or tenderness. No middle ear effusion. Tympanic membrane is not erythematous.     Left Ear: Tympanic membrane, ear canal and external ear normal. No drainage, swelling or tenderness.  No middle ear effusion. Tympanic membrane is not erythematous.     Nose: No congestion or rhinorrhea.     Mouth/Throat:     Mouth: Mucous membranes are moist.     Pharynx: Oropharynx is clear. Posterior oropharyngeal erythema present. No oropharyngeal exudate.     Tonsils: No tonsillar exudate.     Comments: Post nasal drainage Eyes:     General: No scleral icterus.    Extraocular Movements: Extraocular movements intact.  Cardiovascular:     Rate and Rhythm: Normal rate and regular rhythm.  Pulmonary:     Effort: Pulmonary effort is normal. No respiratory distress.     Breath sounds: Normal breath sounds. No wheezing, rhonchi or rales.  Abdominal:     General: Abdomen is flat.  Musculoskeletal:     Cervical back: Normal range of motion and neck supple.  Lymphadenopathy:     Cervical: No cervical adenopathy.  Skin:    General: Skin is warm and dry.     Coloration: Skin is not jaundiced or pale.     Findings: No erythema or rash.  Neurological:     Mental Status: She is alert and oriented to person, place, and time.     Motor: No weakness.  Psychiatric:        Behavior: Behavior is cooperative.      UC Treatments / Results  Labs (all labs ordered are listed, but only abnormal results are displayed) Labs Reviewed  POCT INFLUENZA A/B - Normal  SARS CORONAVIRUS 2 (TAT 6-24 HRS)    EKG   Radiology No results found.  Procedures Procedures (including critical care time)  Medications Ordered in UC Medications - No data to display  Initial Impression / Assessment and Plan / UC Course  I have reviewed the triage vital signs and the nursing notes.  Pertinent labs & imaging results that were available during my care of the patient were  reviewed by me and considered in my medical decision making (see chart for details).   Patient is well-appearing, normotensive, afebrile, not tachycardic, not tachypneic, oxygenating well on room air.    1. Viral URI with cough 2. Encounter for screening for COVID-19 Suspect viral etiology Vitals and exam are reassuring Influenza test negative, COVID-19 test pending Supportive care discussed ER and return precautions also discussed Work excuse given  The patient was given the opportunity to ask questions.  All questions answered to their satisfaction.  The patient is in agreement to this plan.    Final Clinical Impressions(s) / UC Diagnoses   Final diagnoses:  Viral URI with cough  Encounter for screening for COVID-19     Discharge Instructions      You have a viral upper respiratory infection.  Symptoms should improve over the next week to 10 days.  If you develop chest pain or shortness of breath, go to the emergency room.  We have tested you today for COVID-19.  Influenza test is negative today.  You will see the results in Mychart and we will contact you with positive results.  Please stay home and isolate until you are fever free for 24 hours without fever reducing medication.    Some things that can make you feel better are: - Increased rest - Increasing fluid with water/sugar free electrolytes - Acetaminophen and  ibuprofen as needed for fever/pain - Salt water gargling, chloraseptic spray and throat lozenges - OTC guaifenesin (Mucinex) 600 mg twice daily - Saline sinus flushes or a neti pot - Humidifying the air -Tessalon Perles every 8 hours as needed for dry cough     ED Prescriptions     Medication Sig Dispense Auth. Provider   benzonatate (TESSALON) 100 MG capsule Take 1 capsule (100 mg total) by mouth 3 (three) times daily as needed for cough. Do not take with alcohol or while driving or operating heavy machinery.  May cause drowsiness. 21 capsule Valentino Nose, NP      PDMP not reviewed this encounter.   Valentino Nose, NP 11/29/22 1620

## 2022-11-30 ENCOUNTER — Ambulatory Visit: Payer: Medicaid Other | Admitting: Cardiology

## 2022-11-30 LAB — SARS CORONAVIRUS 2 (TAT 6-24 HRS): SARS Coronavirus 2: NEGATIVE

## 2022-12-04 DIAGNOSIS — Z30430 Encounter for insertion of intrauterine contraceptive device: Secondary | ICD-10-CM | POA: Diagnosis not present

## 2022-12-04 DIAGNOSIS — Z419 Encounter for procedure for purposes other than remedying health state, unspecified: Secondary | ICD-10-CM | POA: Diagnosis not present

## 2022-12-04 DIAGNOSIS — N762 Acute vulvitis: Secondary | ICD-10-CM | POA: Diagnosis not present

## 2023-01-04 DIAGNOSIS — Z419 Encounter for procedure for purposes other than remedying health state, unspecified: Secondary | ICD-10-CM | POA: Diagnosis not present

## 2023-01-08 ENCOUNTER — Encounter: Payer: Self-pay | Admitting: Cardiology

## 2023-01-08 ENCOUNTER — Ambulatory Visit: Payer: Medicaid Other | Attending: Cardiology | Admitting: Cardiology

## 2023-01-08 VITALS — BP 110/82 | HR 63 | Ht 67.0 in | Wt 224.2 lb

## 2023-01-08 DIAGNOSIS — I10 Essential (primary) hypertension: Secondary | ICD-10-CM

## 2023-01-08 DIAGNOSIS — Z79899 Other long term (current) drug therapy: Secondary | ICD-10-CM

## 2023-01-08 MED ORDER — FUROSEMIDE 40 MG PO TABS: ORAL_TABLET | ORAL | 3 refills | Status: AC

## 2023-01-08 MED ORDER — AMLODIPINE BESYLATE 2.5 MG PO TABS: 2.5000 mg | ORAL_TABLET | Freq: Every day | ORAL | 3 refills | Status: DC

## 2023-01-08 NOTE — Patient Instructions (Addendum)
Medication Instructions:  Your physician has recommended you make the following change in your medication:  STOP: Labetalol  STOP: Nifedipine START: Lasix 40 mg once daily for 3 days then AS NEEDED START: Amlodipine 2.5 mg once daily *If you need a refill on your cardiac medications before your next appointment, please call your pharmacy*   Lab Work: CMET, Mag If you have labs (blood work) drawn today and your tests are completely normal, you will receive your results only by: MyChart Message (if you have MyChart) OR A paper copy in the mail If you have any lab test that is abnormal or we need to change your treatment, we will call you to review the results.   Testing/Procedures: None   Follow-Up: At [facility], you and your health needs are our priority.  As part of our continuing mission to provide you with exceptional heart care, we have created designated Provider Care Teams.  These Care Teams include your primary Cardiologist (physician) and Advanced Practice Providers (APPs -  Physician Assistants and Nurse Practitioners) who all work together to provide you with the care you need, when you need it.  Your next appointment:   6 week(s) - overbook  Provider:   Thomasene Ripple, DO     Other Instructions You are invited to attend: [facility] event on February 1st at [facility] [place], [place], [place] [address]) from 8am-12pm. Feel free to invite other women to attend!  See you there!

## 2023-01-09 LAB — COMPREHENSIVE METABOLIC PANEL
ALT: 12 [IU]/L (ref 0–32)
AST: 12 [IU]/L (ref 0–40)
Albumin: 4.4 g/dL (ref 3.9–4.9)
Alkaline Phosphatase: 107 [IU]/L (ref 44–121)
BUN/Creatinine Ratio: 13 (ref 9–23)
BUN: 12 mg/dL (ref 6–20)
Bilirubin Total: 0.3 mg/dL (ref 0.0–1.2)
CO2: 27 mmol/L (ref 20–29)
Calcium: 9.5 mg/dL (ref 8.7–10.2)
Chloride: 103 mmol/L (ref 96–106)
Creatinine, Ser: 0.91 mg/dL (ref 0.57–1.00)
Globulin, Total: 2.5 g/dL (ref 1.5–4.5)
Glucose: 91 mg/dL (ref 70–99)
Potassium: 4.5 mmol/L (ref 3.5–5.2)
Sodium: 140 mmol/L (ref 134–144)
Total Protein: 6.9 g/dL (ref 6.0–8.5)
eGFR: 86 mL/min/{1.73_m2} (ref >59)

## 2023-01-09 LAB — MAGNESIUM: Magnesium: 2.2 mg/dL (ref 1.6–2.3)

## 2023-01-11 DIAGNOSIS — Z30431 Encounter for routine checking of intrauterine contraceptive device: Secondary | ICD-10-CM | POA: Diagnosis not present

## 2023-01-11 DIAGNOSIS — T8389XA Other specified complication of genitourinary prosthetic devices, implants and grafts, initial encounter: Secondary | ICD-10-CM | POA: Diagnosis not present

## 2023-01-12 NOTE — Progress Notes (Signed)
Cardio-Obstetrics Clinic  Follow Up Note   Date:  01/12/2023   ID:  Samantha Alexander, DOB 09-14-1990, MRN [id number]  PCP:  Olive Bass, FNP   [facility] Providers Cardiologist:  Thomasene Ripple, DO  Electrophysiologist:  None        Referring MD: Olive Bass,*   Chief Complaint: " I am ok"  History of Present Illness:    Samantha Alexander is a 32 y.o. female [G2P1103] who returns for follow up .   Medical hx includes hypertension.   She is currently taking Lasix, iron, and two antihypertensives, labetalol and nifedipine. She reports that her blood pressure drops significantly if she takes the antihypertensives daily, and she has not taken them in the past few days. She is considering having another child in the next two to three years.  Prior CV Studies Reviewed: The following studies were reviewed today:    Zio monitor 03/24/2021 Patch Wear Time:  14 days and 0 hours (2022-12-19T09:25:49-0500 to 2023-01-02T09:25:53-0500)   Patient had a min HR of 62 bpm, max HR of 152 bpm, and avg HR of 99 bpm. Predominant underlying rhythm was Sinus Rhythm. First Degree AV Block was present. Isolated SVEs were rare (<1.0%), SVE Couplets were rare (<1.0%), and no SVE Triplets were present.  Isolated VEs were rare (<1.0%), and no VE Couplets or VE Triplets were present.   Symptoms associated with sinus rhythm and sinus tachycardia.   Conclusion: Normal/unremarkable study with no evidence of arrhythmia.   TTE 03/13/2021 FINDINGS   Left Ventricle: Left ventricular ejection fraction, by estimation, is 70  to 75%. Left ventricular ejection fraction by 3D volume is 71 %. The left  ventricle has hyperdynamic function. The left ventricle has no regional  wall motion abnormalities. The  left ventricular internal cavity size was normal in size. There is no left  ventricular hypertrophy. Left ventricular diastolic parameters were  normal.   Right Ventricle: The  right ventricular size is normal. No increase in  right ventricular wall thickness. Right ventricular systolic function is  normal. There is normal pulmonary artery systolic pressure. The tricuspid  regurgitant velocity is 2.08 m/s, and   with an assumed right atrial pressure of 3 mmHg, the estimated right  ventricular systolic pressure is 20.3 mmHg.   Left Atrium: Left atrial size was normal in size.   Right Atrium: Right atrial size was normal in size.   Pericardium: There is no evidence of pericardial effusion.   Mitral Valve: The mitral valve is normal in structure. No evidence of mitral valve regurgitation. No evidence of mitral valve stenosis.   Tricuspid Valve: The tricuspid valve is normal in structure. Tricuspid valve regurgitation is not demonstrated. No evidence of tricuspid stenosis.   Aortic Valve: The aortic valve is tricuspid. Aortic valve regurgitation is not visualized. No aortic stenosis is present.   Pulmonic Valve: The pulmonic valve was not well visualized. Pulmonic valve regurgitation is not visualized. No evidence of pulmonic stenosis.   Aorta: The aortic root and ascending aorta are structurally normal, with no evidence of dilitation.   Venous: The inferior vena cava is normal in size with greater than 50% respiratory variability, suggesting right atrial pressure of 3 mmHg.   IAS/Shunts: No atrial level shunt detected by color flow Doppler.   Past Medical History:  Diagnosis Date   Abscess of vulva    Anemia    Headache    HPV (human papilloma virus) anogenital infection    Pregnancy induced hypertension  Past Surgical History:  Procedure Laterality Date   CESAREAN SECTION MULTI-GESTATIONAL N/A 05/10/2021   Procedure: CESAREAN SECTION MULTI-GESTATIONAL;  Surgeon: Osborn Coho, MD;  Location: [facility];  Service: Obstetrics;  Laterality: N/A;   hemorrhoid ligation     WISDOM TOOTH EXTRACTION        OB History     Gravida  2   Para  2   Term   1   Preterm  1   AB      Living  3      SAB      IAB      Ectopic      Multiple  1   Live Births  3               Current Medications: Current Meds  Medication Sig   amLODipine (NORVASC) 2.5 MG tablet Take 1 tablet (2.5 mg total) by mouth daily.   aspirin 81 MG EC tablet Taking daily prn   Cholecalciferol (VITAMIN D3) 50 MCG (2000 UT) capsule Take 2,000 Units by mouth daily.   furosemide (LASIX) 40 MG tablet Take 1 tablet daily for 3 days than AS NEEDED   iron polysaccharides (FERREX 150) 150 MG capsule Take 1 capsule (150 mg total) by mouth daily.   Probiotic Product (PROBIOTIC PO) Take by mouth daily.   [DISCONTINUED] furosemide (LASIX) 40 MG tablet Take 1 tablet by mouth once a week   [DISCONTINUED] labetalol (NORMODYNE) 100 MG tablet Take 2 tablets (200 mg total) by mouth daily. PATIENT MUST KEEP SCHEDULED APPOINTMENT FOR FUTURE REFILLS   [DISCONTINUED] NIFEdipine (PROCARDIA XL/NIFEDICAL XL) 60 MG 24 hr tablet Take 1 tablet (60 mg total) by mouth daily.     Allergies:   Fentanyl   Social History   Socioeconomic History   Marital status: Single    Spouse name: Not on file   Number of children: Not on file   Years of education: Not on file   Highest education level: Not on file  Occupational History   Not on file  Tobacco Use   Smoking status: Never   Smokeless tobacco: Never  Vaping Use   Vaping status: Never Used  Substance and Sexual Activity   Alcohol use: Not Currently   Drug use: Never   Sexual activity: Not Currently  Other Topics Concern   Not on file  Social History Narrative   Not on file   Social Determinants of Health   Financial Resource Strain: Not on file  Food Insecurity: Not on file  Transportation Needs: Not on file  Physical Activity: Not on file  Stress: Not on file  Social Connections: Not on file      Family History  Problem Relation Age of Onset   Hypertension Mother    Diabetes Mother    Colon polyps Mother     Hypertension Father    Diabetes Father    Colon cancer Father    Diabetes Sister    Lupus Sister    Diverticulitis Brother        died durning surgery   Heart failure Brother    Heart disease Maternal Grandfather    Colon cancer Paternal Grandmother    Colon cancer Paternal Uncle    Prostate cancer Paternal Uncle    Breast cancer Maternal Aunt    Ovarian cancer Maternal Aunt    Esophageal cancer Maternal Uncle        smoker      ROS:   Please see  the history of present illness.     All other systems reviewed and are negative.   Labs/EKG Reviewed:    EKG:   EKG today shows sinus rhythm.  Recent Labs: 05/11/2022: TSH 0.64 10/26/2022: Hemoglobin 11.7; Platelet Count 313 01/08/2023: ALT 12; BUN 12; Creatinine, Ser 0.91; Magnesium 2.2; Potassium 4.5; Sodium 140   Recent Lipid Panel No results found for: "CHOL", "TRIG", "HDL", "CHOLHDL", "LDLCALC", "LDLDIRECT"  Physical Exam:    VS:  BP 110/82 (BP Location: Right Arm, Patient Position: Sitting, Cuff Size: Large)   Pulse 63   Ht 5\' 7"  (1.702 m)   Wt 224 lb 3.2 oz (101.7 kg)   SpO2 96%   BMI 35.11 kg/m     Wt Readings from Last 3 Encounters:  01/08/23 224 lb 3.2 oz (101.7 kg)  10/26/22 223 lb (101.2 kg)  06/25/22 220 lb 11.2 oz (100.1 kg)     GEN:  Well nourished, well developed in no acute distress HEENT: Normal NECK: No JVD; No carotid bruits LYMPHATICS: No lymphadenopathy CARDIAC: RRR, no murmurs, rubs, gallops RESPIRATORY:  Clear to auscultation without rales, wheezing or rhonchi  ABDOMEN: Soft, non-tender, non-distended MUSCULOSKELETAL:  No edema; No deformity  SKIN: Warm and dry NEUROLOGIC:  Alert and oriented x 3 PSYCHIATRIC:  Normal affect    Risk Assessment/Risk Calculators:                 ASSESSMENT & PLAN:    Hypertension  Obesity   Hypertension Well controlled on current regimen of Labetalol and Nifedipine, but patient reports occasional hypotension with daily use. Patient is  considering future pregnancy. -Discontinue Labetalol and Nifedipine. -Start Amlodipine 2.5mg  daily, a safer option for potential future pregnancy. -Check kidney function today.  Edema Patient reports intermittent leg swelling. Currently on Lasix. -Continue Lasix 40mg  for three more days, then switch to as needed use. -Check kidney function today due to potential Lasix impact.  Obesity - lifestyle modification advised.   Follow-up in 6 weeks to assess blood pressure control and kidney function on new regimen. Patient Instructions  Medication Instructions:  Your physician has recommended you make the following change in your medication:  STOP: Labetalol  STOP: Nifedipine START: Lasix 40 mg once daily for 3 days then AS NEEDED START: Amlodipine 2.5 mg once daily *If you need a refill on your cardiac medications before your next appointment, please call your pharmacy*   Lab Work: CMET, Mag If you have labs (blood work) drawn today and your tests are completely normal, you will receive your results only by: MyChart Message (if you have MyChart) OR A paper copy in the mail If you have any lab test that is abnormal or we need to change your treatment, we will call you to review the results.   Testing/Procedures: None   Follow-Up: At [facility], you and your health needs are our priority.  As part of our continuing mission to provide you with exceptional heart care, we have created designated Provider Care Teams.  These Care Teams include your primary Cardiologist (physician) and Advanced Practice Providers (APPs -  Physician Assistants and Nurse Practitioners) who all work together to provide you with the care you need, when you need it.  Your next appointment:   6 week(s) - overbook  Provider:   Thomasene Ripple, DO     Other Instructions You are invited to attend: [facility] event on February 1st at [facility] [place], [place], [place] [address])  from  8am-12pm. Feel free to invite other women to attend!  See you there!      Dispo:  No follow-ups on file.   Medication Adjustments/Labs and Tests Ordered: Current medicines are reviewed at length with the patient today.  Concerns regarding medicines are outlined above.  Tests Ordered: Orders Placed This Encounter  Procedures   Comprehensive Metabolic Panel (CMET)   Magnesium   EKG 12-Lead   Medication Changes: Meds ordered this encounter  Medications   furosemide (LASIX) 40 MG tablet    Sig: Take 1 tablet daily for 3 days than AS NEEDED    Dispense:  25 tablet    Refill:  3    After initial fill remove "Take 1 tablet daily for 3 days than"   amLODipine (NORVASC) 2.5 MG tablet    Sig: Take 1 tablet (2.5 mg total) by mouth daily.    Dispense:  180 tablet    Refill:  3

## 2023-02-03 DIAGNOSIS — Z419 Encounter for procedure for purposes other than remedying health state, unspecified: Secondary | ICD-10-CM | POA: Diagnosis not present

## 2023-02-21 ENCOUNTER — Ambulatory Visit: Payer: Medicaid Other | Admitting: Cardiology

## 2023-03-06 DIAGNOSIS — Z419 Encounter for procedure for purposes other than remedying health state, unspecified: Secondary | ICD-10-CM | POA: Diagnosis not present

## 2023-04-05 ENCOUNTER — Ambulatory Visit: Payer: Medicaid Other | Admitting: Cardiology

## 2023-04-06 DIAGNOSIS — Z419 Encounter for procedure for purposes other than remedying health state, unspecified: Secondary | ICD-10-CM | POA: Diagnosis not present

## 2023-04-11 ENCOUNTER — Ambulatory Visit: Payer: BC Managed Care – PPO | Attending: Cardiology | Admitting: Cardiology

## 2023-04-11 ENCOUNTER — Encounter: Payer: Self-pay | Admitting: Cardiology

## 2023-04-11 VITALS — BP 120/80 | HR 81 | Ht 68.0 in | Wt 224.6 lb

## 2023-04-11 DIAGNOSIS — Z79899 Other long term (current) drug therapy: Secondary | ICD-10-CM | POA: Diagnosis not present

## 2023-04-11 DIAGNOSIS — Z13220 Encounter for screening for lipoid disorders: Secondary | ICD-10-CM

## 2023-04-11 DIAGNOSIS — R7303 Prediabetes: Secondary | ICD-10-CM

## 2023-04-11 DIAGNOSIS — I10 Essential (primary) hypertension: Secondary | ICD-10-CM | POA: Diagnosis not present

## 2023-04-11 MED ORDER — AMLODIPINE BESYLATE 2.5 MG PO TABS: 2.5000 mg | ORAL_TABLET | Freq: Every day | ORAL | 3 refills | Status: AC

## 2023-04-11 NOTE — Patient Instructions (Signed)
 Medication Instructions:  Your physician recommends that you continue on your current medications as directed. Please refer to the Current Medication list given to you today.  *If you need a refill on your cardiac medications before your next appointment, please call your pharmacy*   Lab Work: CMET, Mag, Lipids, TSH, HgbA1c  If you have labs (blood work) drawn today and your tests are completely normal, you will receive your results only by: MyChart Message (if you have MyChart) OR A paper copy in the mail If you have any lab test that is abnormal or we need to change your treatment, we will call you to review the results.    Follow-Up: At [facility], you and your health needs are our priority.  As part of our continuing mission to provide you with exceptional heart care, we have created designated Provider Care Teams.  These Care Teams include your primary Cardiologist (physician) and Advanced Practice Providers (APPs -  Physician Assistants and Nurse Practitioners) who all work together to provide you with the care you need, when you need it.  Your next appointment:   1 year(s)  Provider:   Kardie Tobb, DO

## 2023-04-12 LAB — COMPREHENSIVE METABOLIC PANEL
ALT: 13 [IU]/L (ref 0–32)
AST: 14 [IU]/L (ref 0–40)
Albumin: 4.7 g/dL (ref 3.9–4.9)
Alkaline Phosphatase: 100 [IU]/L (ref 44–121)
BUN/Creatinine Ratio: 15 (ref 9–23)
BUN: 12 mg/dL (ref 6–20)
Bilirubin Total: 0.3 mg/dL (ref 0.0–1.2)
CO2: 24 mmol/L (ref 20–29)
Calcium: 9.6 mg/dL (ref 8.7–10.2)
Chloride: 102 mmol/L (ref 96–106)
Creatinine, Ser: 0.8 mg/dL (ref 0.57–1.00)
Globulin, Total: 2.4 g/dL (ref 1.5–4.5)
Glucose: 85 mg/dL (ref 70–99)
Potassium: 4.8 mmol/L (ref 3.5–5.2)
Sodium: 139 mmol/L (ref 134–144)
Total Protein: 7.1 g/dL (ref 6.0–8.5)
eGFR: 101 mL/min/{1.73_m2} (ref >59)

## 2023-04-12 LAB — LIPID PANEL
Chol/HDL Ratio: 4 {ratio} (ref 0.0–4.4)
Cholesterol, Total: 175 mg/dL (ref 100–199)
HDL: 44 mg/dL (ref >39)
LDL Chol Calc (NIH): 111 mg/dL — ABNORMAL HIGH (ref 0–99)
Triglycerides: 109 mg/dL (ref 0–149)
VLDL Cholesterol Cal: 20 mg/dL (ref 5–40)

## 2023-04-12 LAB — HEMOGLOBIN A1C
Est. average glucose Bld gHb Est-mCnc: 120 mg/dL
Hgb A1c MFr Bld: 5.8 % — ABNORMAL HIGH (ref 4.8–5.6)

## 2023-04-12 LAB — TSH+T4F+T3FREE
Free T4: 1.13 ng/dL (ref 0.82–1.77)
T3, Free: 3 pg/mL (ref 2.0–4.4)
TSH: 0.527 u[IU]/mL (ref 0.450–4.500)

## 2023-04-12 LAB — MAGNESIUM: Magnesium: 2.1 mg/dL (ref 1.6–2.3)

## 2023-04-13 NOTE — Progress Notes (Signed)
 Cardio-Obstetrics Clinic  Follow Up Note   Date:  04/13/2023   ID:  Samantha Alexander, DOB 06-20-1990, MRN [id number]  PCP:  Jason Leita Repine, FNP   [facility] Providers Cardiologist:  Dub Huntsman, DO  Electrophysiologist:  None        Referring MD: Jason Leita Repine,*   Chief Complaint:  I am ok  History of Present Illness:    Samantha Alexander is a 33 y.o. female [G2P1103] who returns for follow up .   Medical hx includes hypertension.   She is managed with amlodipine , presents with intermittent leg swelling. She notes that the swelling is not daily, but occurs periodically, often towards the end of the week. She has been using compression socks at work, at least three times a week, and has a bench to elevate her feet. The swelling is more pronounced in the feet and improves by morning. She has also noticed the development of varicose veins.  In addition to the leg swelling, the patient has been experiencing occasional elevations in blood pressure, reaching up to 140, but these episodes are spaced out and not frequent. She has been taking aspirin as needed when the blood pressure is high, but has not needed it in about a month.  The patient also has an IUD in place and has not been experiencing any issues with it. She has a family history of heart disease and stents in the legs.  Prior CV Studies Reviewed: The following studies were reviewed today:    Zio monitor 03/24/2021 Patch Wear Time:  14 days and 0 hours (2022-12-19T09:25:49-0500 to 2023-01-02T09:25:53-0500)   Patient had a min HR of 62 bpm, max HR of 152 bpm, and avg HR of 99 bpm. Predominant underlying rhythm was Sinus Rhythm. First Degree AV Block was present. Isolated SVEs were rare (<1.0%), SVE Couplets were rare (<1.0%), and no SVE Triplets were present.  Isolated VEs were rare (<1.0%), and no VE Couplets or VE Triplets were present.   Symptoms associated with sinus rhythm and sinus  tachycardia.   Conclusion: Normal/unremarkable study with no evidence of arrhythmia.   TTE 03/13/2021 FINDINGS   Left Ventricle: Left ventricular ejection fraction, by estimation, is 70  to 75%. Left ventricular ejection fraction by 3D volume is 71 %. The left  ventricle has hyperdynamic function. The left ventricle has no regional  wall motion abnormalities. The  left ventricular internal cavity size was normal in size. There is no left  ventricular hypertrophy. Left ventricular diastolic parameters were  normal.   Right Ventricle: The right ventricular size is normal. No increase in  right ventricular wall thickness. Right ventricular systolic function is  normal. There is normal pulmonary artery systolic pressure. The tricuspid  regurgitant velocity is 2.08 m/s, and   with an assumed right atrial pressure of 3 mmHg, the estimated right  ventricular systolic pressure is 20.3 mmHg.   Left Atrium: Left atrial size was normal in size.   Right Atrium: Right atrial size was normal in size.   Pericardium: There is no evidence of pericardial effusion.   Mitral Valve: The mitral valve is normal in structure. No evidence of mitral valve regurgitation. No evidence of mitral valve stenosis.   Tricuspid Valve: The tricuspid valve is normal in structure. Tricuspid valve regurgitation is not demonstrated. No evidence of tricuspid stenosis.   Aortic Valve: The aortic valve is tricuspid. Aortic valve regurgitation is not visualized. No aortic stenosis is present.   Pulmonic Valve: The pulmonic valve was  not well visualized. Pulmonic valve regurgitation is not visualized. No evidence of pulmonic stenosis.   Aorta: The aortic root and ascending aorta are structurally normal, with no evidence of dilitation.   Venous: The inferior vena cava is normal in size with greater than 50% respiratory variability, suggesting right atrial pressure of 3 mmHg.   IAS/Shunts: No atrial level shunt detected by  color flow Doppler.   Past Medical History:  Diagnosis Date   Abscess of vulva    Anemia    Headache    HPV (human papilloma virus) anogenital infection    Pregnancy induced hypertension     Past Surgical History:  Procedure Laterality Date   CESAREAN SECTION MULTI-GESTATIONAL N/A 05/10/2021   Procedure: CESAREAN SECTION MULTI-GESTATIONAL;  Surgeon: Henry Slough, MD;  Location: [facility] LD ORS;  Service: Obstetrics;  Laterality: N/A;   hemorrhoid ligation     WISDOM TOOTH EXTRACTION        OB History     Gravida  2   Para  2   Term  1   Preterm  1   AB      Living  3      SAB      IAB      Ectopic      Multiple  1   Live Births  3               Current Medications: Current Meds  Medication Sig   Cholecalciferol (VITAMIN D3) 50 MCG (2000 UT) capsule Take 2,000 Units by mouth daily.   furosemide  (LASIX ) 40 MG tablet Take 1 tablet daily for 3 days than AS NEEDED   iron  polysaccharides (FERREX 150) 150 MG capsule Take 1 capsule (150 mg total) by mouth daily.   Probiotic Product (PROBIOTIC PO) Take by mouth daily.   [DISCONTINUED] aspirin 81 MG EC tablet Taking daily prn     Allergies:   Fentanyl    Social History   Socioeconomic History   Marital status: Single    Spouse name: Not on file   Number of children: Not on file   Years of education: Not on file   Highest education level: Not on file  Occupational History   Not on file  Tobacco Use   Smoking status: Never   Smokeless tobacco: Never  Vaping Use   Vaping status: Never Used  Substance and Sexual Activity   Alcohol use: Not Currently   Drug use: Never   Sexual activity: Not Currently  Other Topics Concern   Not on file  Social History Narrative   Not on file   Social Drivers of Health   Financial Resource Strain: Not on file  Food Insecurity: Not on file  Transportation Needs: Not on file  Physical Activity: Not on file  Stress: Not on file  Social Connections: Not on file       Family History  Problem Relation Age of Onset   Hypertension Mother    Diabetes Mother    Colon polyps Mother    Hypertension Father    Diabetes Father    Colon cancer Father    Diabetes Sister    Lupus Sister    Diverticulitis Brother        died durning surgery   Heart failure Brother    Heart disease Maternal Grandfather    Colon cancer Paternal Grandmother    Colon cancer Paternal Uncle    Prostate cancer Paternal Uncle    Breast cancer Maternal Aunt  Ovarian cancer Maternal Aunt    Esophageal cancer Maternal Uncle        smoker      ROS:   Please see the history of present illness.     All other systems reviewed and are negative.   Labs/EKG Reviewed:    EKG:   EKG today shows sinus rhythm.  Recent Labs: 10/26/2022: Hemoglobin 11.7; Platelet Count 313 04/11/2023: ALT 13; BUN 12; Creatinine, Ser 0.80; Magnesium  2.1; Potassium 4.8; Sodium 139; TSH 0.527   Recent Lipid Panel Lab Results  Component Value Date/Time   CHOL 175 04/11/2023 10:16 AM   TRIG 109 04/11/2023 10:16 AM   HDL 44 04/11/2023 10:16 AM   CHOLHDL 4.0 04/11/2023 10:16 AM   LDLCALC 111 (H) 04/11/2023 10:16 AM    Physical Exam:    VS:  BP 120/80 (BP Location: Right Arm, Patient Position: Sitting, Cuff Size: Normal)   Pulse 81   Ht 5' 8 (1.727 m)   Wt 224 lb 9.6 oz (101.9 kg)   SpO2 98%   BMI 34.15 kg/m     Wt Readings from Last 3 Encounters:  04/11/23 224 lb 9.6 oz (101.9 kg)  01/08/23 224 lb 3.2 oz (101.7 kg)  10/26/22 223 lb (101.2 kg)     GEN:  Well nourished, well developed in no acute distress HEENT: Normal NECK: No JVD; No carotid bruits LYMPHATICS: No lymphadenopathy CARDIAC: RRR, no murmurs, rubs, gallops RESPIRATORY:  Clear to auscultation without rales, wheezing or rhonchi  ABDOMEN: Soft, non-tender, non-distended MUSCULOSKELETAL:  No edema; No deformity  SKIN: Warm and dry NEUROLOGIC:  Alert and oriented x 3 PSYCHIATRIC:  Normal affect    Risk Assessment/Risk  Calculators:                 ASSESSMENT & PLAN:    Hypertension  Obesity   Lower Extremity Edema Periodic swelling, worse towards the end of the week. Noted varicose veins. No daily symptoms. No heart issues in family history. -Continue compression socks at work. -Monitor diet, particularly salt intake, as it can exacerbate swelling in the setting of varicose veins.  Hypertension Periodic elevations in blood pressure, up to 140 systolic. Currently on Amlodipine . -Continue Amlodipine . -Notify provider if blood pressure readings are elevated twice in the same week.  General Health Maintenance -Discontinue Aspirin due to increased risk of bleeding and current use of IUD. -Check kidney function. -Screen for high cholesterol. -Repeat prediabetes screening. -Follow-up in 1 year, or sooner if issues arise.   Patient Instructions  Medication Instructions:  Your physician recommends that you continue on your current medications as directed. Please refer to the Current Medication list given to you today.  *If you need a refill on your cardiac medications before your next appointment, please call your pharmacy*   Lab Work: CMET, Mag, Lipids, TSH, HgbA1c  If you have labs (blood work) drawn today and your tests are completely normal, you will receive your results only by: MyChart Message (if you have MyChart) OR A paper copy in the mail If you have any lab test that is abnormal or we need to change your treatment, we will call you to review the results.    Follow-Up: At [facility], you and your health needs are our priority.  As part of our continuing mission to provide you with exceptional heart care, we have created designated Provider Care Teams.  These Care Teams include your primary Cardiologist (physician) and Advanced Practice Providers (APPs -  Physician Assistants and  Nurse Practitioners) who all work together to provide you with the care you need, when you  need it.  Your next appointment:   1 year(s)  Provider:   Krysten Veronica, DO         Dispo:  No follow-ups on file.   Medication Adjustments/Labs and Tests Ordered: Current medicines are reviewed at length with the patient today.  Concerns regarding medicines are outlined above.  Tests Ordered: Orders Placed This Encounter  Procedures   Comprehensive Metabolic Panel (CMET)   Magnesium    Lipid panel   TSH+T4F+T3Free   Hemoglobin A1c   Medication Changes: Meds ordered this encounter  Medications   amLODipine  (NORVASC ) 2.5 MG tablet    Sig: Take 1 tablet (2.5 mg total) by mouth daily.    Dispense:  90 tablet    Refill:  3

## 2023-04-26 ENCOUNTER — Encounter: Payer: Self-pay | Admitting: Hematology

## 2023-05-01 ENCOUNTER — Other Ambulatory Visit: Payer: Medicaid Other

## 2023-05-01 ENCOUNTER — Ambulatory Visit: Payer: Medicaid Other | Admitting: Hematology

## 2023-05-04 DIAGNOSIS — Z419 Encounter for procedure for purposes other than remedying health state, unspecified: Secondary | ICD-10-CM | POA: Diagnosis not present

## 2023-05-16 ENCOUNTER — Other Ambulatory Visit: Payer: Self-pay

## 2023-05-16 DIAGNOSIS — D500 Iron deficiency anemia secondary to blood loss (chronic): Secondary | ICD-10-CM

## 2023-05-17 ENCOUNTER — Inpatient Hospital Stay: Payer: Medicaid Other | Admitting: Hematology

## 2023-05-17 ENCOUNTER — Inpatient Hospital Stay: Payer: Medicaid Other | Attending: Hematology

## 2023-05-21 ENCOUNTER — Ambulatory Visit: Admitting: Family

## 2023-05-24 ENCOUNTER — Ambulatory Visit: Admitting: Family

## 2023-05-30 ENCOUNTER — Ambulatory Visit: Admitting: Family

## 2023-05-30 VITALS — BP 134/82 | HR 91 | Temp 98.4°F | Ht 68.0 in | Wt 228.2 lb

## 2023-05-30 DIAGNOSIS — R051 Acute cough: Secondary | ICD-10-CM

## 2023-05-30 DIAGNOSIS — J029 Acute pharyngitis, unspecified: Secondary | ICD-10-CM | POA: Diagnosis not present

## 2023-05-30 LAB — POCT RAPID STREP A (OFFICE): Rapid Strep A Screen: NEGATIVE

## 2023-05-30 LAB — POC COVID19 BINAXNOW: SARS Coronavirus 2 Ag: NEGATIVE

## 2023-05-30 LAB — POC INFLUENZA A&B (BINAX/QUICKVUE)
Influenza A, POC: NEGATIVE
Influenza B, POC: NEGATIVE

## 2023-05-30 MED ORDER — BENZONATATE 100 MG PO CAPS: 100.0000 mg | ORAL_CAPSULE | Freq: Three times a day (TID) | ORAL | 0 refills | Status: AC | PRN

## 2023-05-30 NOTE — Progress Notes (Signed)
 Samantha Alexander is a 33 y.o. female with the following history as recorded in [employer]:  Patient Active Problem List   Diagnosis Date Noted   IDA (iron deficiency anemia) 06/25/2022   Rectal bleeding 11/10/2021   Family history of colon cancer in father 11/10/2021   Postpartum hypertension 10/11/2021   Acute blood loss anemia 05/12/2021   Chronic peripheral venous hypertension 05/10/2021   Twin gestation, dichorionic diamniotic 05/10/2021   Discordant fetal growth in twin gestation 05/10/2021   Breech presentation 05/10/2021   Postpartum care following cesarean delivery 05/10/2021   Obesity in pregnancy 04/27/2021   Chronic hypertension in pregnancy 02/15/2021   History of pre-eclampsia 02/15/2021    Current Outpatient Medications  Medication Sig Dispense Refill   amLODipine (NORVASC) 2.5 MG tablet Take 1 tablet (2.5 mg total) by mouth daily. 90 tablet 3   Cholecalciferol (VITAMIN D3) 50 MCG (2000 UT) capsule Take 2,000 Units by mouth daily.     furosemide (LASIX) 40 MG tablet Take 1 tablet daily for 3 days than AS NEEDED 25 tablet 3   iron polysaccharides (FERREX 150) 150 MG capsule Take 1 capsule (150 mg total) by mouth daily. 30 capsule 11   Probiotic Product (PROBIOTIC PO) Take by mouth daily.     No current facility-administered medications for this visit.    Allergies: Fentanyl  Past Medical History:  Diagnosis Date   Abscess of vulva    Anemia    Headache    HPV (human papilloma virus) anogenital infection    Pregnancy induced hypertension     Past Surgical History:  Procedure Laterality Date   CESAREAN SECTION MULTI-GESTATIONAL N/A 05/10/2021   Procedure: CESAREAN SECTION MULTI-GESTATIONAL;  Surgeon: Osborn Coho, MD;  Location: [facility];  Service: Obstetrics;  Laterality: N/A;   hemorrhoid ligation     WISDOM TOOTH EXTRACTION      Family History  Problem Relation Age of Onset   Hypertension Mother    Diabetes Mother    Colon polyps Mother    Hypertension  Father    Diabetes Father    Colon cancer Father    Diabetes Sister    Lupus Sister    Diverticulitis Brother        died durning surgery   Heart failure Brother    Heart disease Maternal Grandfather    Colon cancer Paternal Grandmother    Colon cancer Paternal Uncle    Prostate cancer Paternal Uncle    Breast cancer Maternal Aunt    Ovarian cancer Maternal Aunt    Esophageal cancer Maternal Uncle        smoker    Social History   Tobacco Use   Smoking status: Never   Smokeless tobacco: Never  Substance Use Topics   Alcohol use: Not Currently    Subjective:   2-3 day history of cough/ congestion/ body aches; requesting COVID and flu swab today; has been using OTC Mucinex but cannot tolerate due to drowsiness; notes that her young children at home do have cough symptoms;   Objective:  Vitals:   05/30/23 1059  BP: 134/82  Pulse: 91  Temp: 98.4 F (36.9 C)  TempSrc: Oral  SpO2: 99%  Weight: 228 lb 3.2 oz (103.5 kg)  Height: 5\' 8"  (1.727 m)    General: Well developed, well nourished, in no acute distress  Skin : Warm and dry.  Head: Normocephalic and atraumatic  Eyes: Sclera and conjunctiva clear; pupils round and reactive to light; extraocular movements intact  Ears: External  normal; canals clear; tympanic membranes normal  Oropharynx: Pink, supple. No suspicious lesions  Neck: Supple without thyromegaly, adenopathy  Lungs: Respirations unlabored; clear to auscultation bilaterally without wheeze, rales, rhonchi  CVS exam: normal rate and regular rhythm.  Neurologic: Alert and oriented; speech intact; face symmetrical; moves all extremities well; CNII-XII intact without focal deficit   Assessment:  1. Acute cough   2. Sore throat     Plan:  Rapid flu and COVID both negative; rapid strep is negative; suspect viral illness; symptomatic treatment discussed; encouraged fluids, rest; work note completed to cover patient through Sunday, March 30; Rx for [employer];  follow up worse, no better.   No follow-ups on file.  Orders Placed This Encounter  Procedures   POC Influenza A&B (Binax test)   POC COVID-19    Previously tested for COVID-19:   No    Resident in a congregate (group) care setting:   No    Employed in healthcare setting:   No    Pregnant:   No   POCT rapid strep A    Requested Prescriptions    No prescriptions requested or ordered in this encounter

## 2023-06-05 ENCOUNTER — Telehealth: Payer: Self-pay | Admitting: Family

## 2023-06-05 NOTE — Telephone Encounter (Signed)
 Pt dropped off paperwork for FMLA to be completed by pcp. Pt asks that forms be faxed when complete and for someone to call her when faxed. Forms placed in pcp's tray in front office.

## 2023-06-05 NOTE — Telephone Encounter (Signed)
 From has been placed in PCP folder for review, I will reach out to pt once form has been faxed.

## 2023-06-07 NOTE — Telephone Encounter (Signed)
 Form has been faxed, spoke with pt. Pt is aware and expressed understanding.

## 2023-06-14 ENCOUNTER — Ambulatory Visit: Payer: Medicaid Other | Admitting: Cardiology

## 2023-06-14 ENCOUNTER — Ambulatory Visit: Admitting: Family

## 2023-06-15 DIAGNOSIS — Z419 Encounter for procedure for purposes other than remedying health state, unspecified: Secondary | ICD-10-CM | POA: Diagnosis not present

## 2023-07-15 DIAGNOSIS — Z419 Encounter for procedure for purposes other than remedying health state, unspecified: Secondary | ICD-10-CM | POA: Diagnosis not present

## 2023-08-15 DIAGNOSIS — Z419 Encounter for procedure for purposes other than remedying health state, unspecified: Secondary | ICD-10-CM | POA: Diagnosis not present

## 2023-09-14 DIAGNOSIS — Z419 Encounter for procedure for purposes other than remedying health state, unspecified: Secondary | ICD-10-CM | POA: Diagnosis not present
